# Patient Record
Sex: Male | Born: 1980 | Race: Asian | Hispanic: No | Marital: Married | Smoking: Never smoker
Health system: Southern US, Community
[De-identification: ages and names within clinical notes are randomized; demographics above are authoritative.]

## PROBLEM LIST (undated history)

## (undated) DIAGNOSIS — I213 ST elevation (STEMI) myocardial infarction of unspecified site: Secondary | ICD-10-CM

## (undated) DIAGNOSIS — I25110 Atherosclerotic heart disease of native coronary artery with unstable angina pectoris: Secondary | ICD-10-CM

## (undated) DIAGNOSIS — I2102 ST elevation (STEMI) myocardial infarction involving left anterior descending coronary artery: Secondary | ICD-10-CM

## (undated) DIAGNOSIS — Z9189 Other specified personal risk factors, not elsewhere classified: Secondary | ICD-10-CM

## (undated) DIAGNOSIS — Q245 Malformation of coronary vessels: Secondary | ICD-10-CM

## (undated) DIAGNOSIS — I5041 Acute combined systolic (congestive) and diastolic (congestive) heart failure: Secondary | ICD-10-CM

## (undated) DIAGNOSIS — I509 Heart failure, unspecified: Secondary | ICD-10-CM

## (undated) DIAGNOSIS — I255 Ischemic cardiomyopathy: Secondary | ICD-10-CM

## (undated) DIAGNOSIS — E785 Hyperlipidemia, unspecified: Secondary | ICD-10-CM

## (undated) DIAGNOSIS — Z955 Presence of coronary angioplasty implant and graft: Secondary | ICD-10-CM

## (undated) HISTORY — PX: CARDIAC CATHETERIZATION: SHX172

---
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
## 2018-09-21 DIAGNOSIS — M545 Low back pain: Secondary | ICD-10-CM | POA: Diagnosis not present

## 2018-09-21 DIAGNOSIS — R319 Hematuria, unspecified: Secondary | ICD-10-CM | POA: Diagnosis not present

## 2018-09-21 DIAGNOSIS — N23 Unspecified renal colic: Secondary | ICD-10-CM | POA: Diagnosis not present

## 2018-09-29 DIAGNOSIS — N209 Urinary calculus, unspecified: Secondary | ICD-10-CM | POA: Diagnosis not present

## 2018-10-12 ENCOUNTER — Encounter (HOSPITAL_COMMUNITY)
Admission: EM | Disposition: A | Discharge status: Home / Self Care | Payer: Self-pay | Source: Home / Self Care | Attending: Interventional Cardiology

## 2018-10-12 ENCOUNTER — Inpatient Hospital Stay (HOSPITAL_COMMUNITY)
Admission: EM | Admit: 2018-10-12 | Discharge: 2018-10-16 | DRG: 246 | Disposition: A | Discharge status: Home / Self Care | Payer: BC Managed Care – PPO | Attending: Interventional Cardiology | Admitting: Interventional Cardiology

## 2018-10-12 ENCOUNTER — Encounter (HOSPITAL_COMMUNITY): Payer: Self-pay | Admitting: Emergency Medicine

## 2018-10-12 ENCOUNTER — Emergency Department (HOSPITAL_COMMUNITY): Payer: BC Managed Care – PPO

## 2018-10-12 ENCOUNTER — Other Ambulatory Visit: Payer: Self-pay

## 2018-10-12 DIAGNOSIS — Q245 Malformation of coronary vessels: Secondary | ICD-10-CM | POA: Diagnosis not present

## 2018-10-12 DIAGNOSIS — Z79899 Other long term (current) drug therapy: Secondary | ICD-10-CM

## 2018-10-12 DIAGNOSIS — I772 Rupture of artery: Secondary | ICD-10-CM | POA: Diagnosis not present

## 2018-10-12 DIAGNOSIS — I2102 ST elevation (STEMI) myocardial infarction involving left anterior descending coronary artery: Secondary | ICD-10-CM | POA: Diagnosis not present

## 2018-10-12 DIAGNOSIS — Z20828 Contact with and (suspected) exposure to other viral communicable diseases: Secondary | ICD-10-CM | POA: Diagnosis present

## 2018-10-12 DIAGNOSIS — Z955 Presence of coronary angioplasty implant and graft: Secondary | ICD-10-CM

## 2018-10-12 DIAGNOSIS — Z9189 Other specified personal risk factors, not elsewhere classified: Secondary | ICD-10-CM

## 2018-10-12 DIAGNOSIS — I213 ST elevation (STEMI) myocardial infarction of unspecified site: Secondary | ICD-10-CM | POA: Diagnosis not present

## 2018-10-12 DIAGNOSIS — I255 Ischemic cardiomyopathy: Secondary | ICD-10-CM | POA: Diagnosis present

## 2018-10-12 DIAGNOSIS — I259 Chronic ischemic heart disease, unspecified: Secondary | ICD-10-CM | POA: Diagnosis not present

## 2018-10-12 DIAGNOSIS — I2101 ST elevation (STEMI) myocardial infarction involving left main coronary artery: Secondary | ICD-10-CM | POA: Diagnosis not present

## 2018-10-12 DIAGNOSIS — I2510 Atherosclerotic heart disease of native coronary artery without angina pectoris: Secondary | ICD-10-CM | POA: Diagnosis not present

## 2018-10-12 DIAGNOSIS — E785 Hyperlipidemia, unspecified: Secondary | ICD-10-CM | POA: Diagnosis not present

## 2018-10-12 DIAGNOSIS — R079 Chest pain, unspecified: Secondary | ICD-10-CM | POA: Diagnosis not present

## 2018-10-12 DIAGNOSIS — M5489 Other dorsalgia: Secondary | ICD-10-CM | POA: Diagnosis not present

## 2018-10-12 DIAGNOSIS — I959 Hypotension, unspecified: Secondary | ICD-10-CM | POA: Diagnosis not present

## 2018-10-12 DIAGNOSIS — D72829 Elevated white blood cell count, unspecified: Secondary | ICD-10-CM | POA: Diagnosis present

## 2018-10-12 DIAGNOSIS — I5041 Acute combined systolic (congestive) and diastolic (congestive) heart failure: Secondary | ICD-10-CM | POA: Diagnosis present

## 2018-10-12 DIAGNOSIS — I214 Non-ST elevation (NSTEMI) myocardial infarction: Secondary | ICD-10-CM | POA: Insufficient documentation

## 2018-10-12 DIAGNOSIS — R1084 Generalized abdominal pain: Secondary | ICD-10-CM | POA: Diagnosis not present

## 2018-10-12 DIAGNOSIS — N179 Acute kidney failure, unspecified: Secondary | ICD-10-CM | POA: Diagnosis not present

## 2018-10-12 DIAGNOSIS — I472 Ventricular tachycardia: Secondary | ICD-10-CM | POA: Diagnosis present

## 2018-10-12 DIAGNOSIS — I25110 Atherosclerotic heart disease of native coronary artery with unstable angina pectoris: Secondary | ICD-10-CM | POA: Diagnosis not present

## 2018-10-12 HISTORY — DX: Ischemic cardiomyopathy: I25.5

## 2018-10-12 HISTORY — DX: Hyperlipidemia, unspecified: E78.5

## 2018-10-12 HISTORY — PX: LEFT HEART CATH AND CORONARY ANGIOGRAPHY: CATH118249

## 2018-10-12 HISTORY — DX: Malformation of coronary vessels: Q24.5

## 2018-10-12 HISTORY — DX: ST elevation (STEMI) myocardial infarction involving left anterior descending coronary artery: I21.02

## 2018-10-12 HISTORY — PX: CORONARY/GRAFT ACUTE MI REVASCULARIZATION: CATH118305

## 2018-10-12 HISTORY — DX: ST elevation (STEMI) myocardial infarction of unspecified site: I21.3

## 2018-10-12 LAB — CBC
HCT: 41.3 % (ref 39.0–52.0)
Hemoglobin: 14.5 g/dL (ref 13.0–17.0)
MCH: 28.7 pg (ref 26.0–34.0)
MCHC: 35.1 g/dL (ref 30.0–36.0)
MCV: 81.6 fL (ref 80.0–100.0)
Platelets: 237 10*3/uL (ref 150–400)
RBC: 5.06 MIL/uL (ref 4.22–5.81)
RDW: 12.1 % (ref 11.5–15.5)
WBC: 17.3 10*3/uL — ABNORMAL HIGH (ref 4.0–10.5)
nRBC: 0 % (ref 0.0–0.2)

## 2018-10-12 LAB — CBC WITH DIFFERENTIAL/PLATELET
Abs Immature Granulocytes: 0.06 10*3/uL (ref 0.00–0.07)
Basophils Absolute: 0 10*3/uL (ref 0.0–0.1)
Basophils Relative: 0 %
Eosinophils Absolute: 0 10*3/uL (ref 0.0–0.5)
Eosinophils Relative: 0 %
HCT: 43.9 % (ref 39.0–52.0)
Hemoglobin: 15.1 g/dL (ref 13.0–17.0)
Immature Granulocytes: 0 %
Lymphocytes Relative: 4 %
Lymphs Abs: 0.7 10*3/uL (ref 0.7–4.0)
MCH: 28.7 pg (ref 26.0–34.0)
MCHC: 34.4 g/dL (ref 30.0–36.0)
MCV: 83.3 fL (ref 80.0–100.0)
Monocytes Absolute: 0.6 10*3/uL (ref 0.1–1.0)
Monocytes Relative: 4 %
Neutro Abs: 14.7 10*3/uL — ABNORMAL HIGH (ref 1.7–7.7)
Neutrophils Relative %: 92 %
Platelets: 257 10*3/uL (ref 150–400)
RBC: 5.27 MIL/uL (ref 4.22–5.81)
RDW: 12.1 % (ref 11.5–15.5)
WBC: 16.1 10*3/uL — ABNORMAL HIGH (ref 4.0–10.5)
nRBC: 0 % (ref 0.0–0.2)

## 2018-10-12 LAB — CBG MONITORING, ED: Glucose-Capillary: 117 mg/dL — ABNORMAL HIGH (ref 70–99)

## 2018-10-12 LAB — POCT ACTIVATED CLOTTING TIME
Activated Clotting Time: 191 seconds
Activated Clotting Time: 351 seconds
Activated Clotting Time: 654 seconds

## 2018-10-12 LAB — BASIC METABOLIC PANEL
Anion gap: 12 (ref 5–15)
BUN: 12 mg/dL (ref 6–20)
CO2: 23 mmol/L (ref 22–32)
Calcium: 9.7 mg/dL (ref 8.9–10.3)
Chloride: 103 mmol/L (ref 98–111)
Creatinine, Ser: 1.23 mg/dL (ref 0.61–1.24)
GFR calc Af Amer: >60 mL/min (ref >60)
GFR calc non Af Amer: >60 mL/min (ref >60)
Glucose, Bld: 133 mg/dL — ABNORMAL HIGH (ref 70–99)
Potassium: 4.3 mmol/L (ref 3.5–5.1)
Sodium: 138 mmol/L (ref 135–145)

## 2018-10-12 LAB — TROPONIN I (HIGH SENSITIVITY)
Troponin I (High Sensitivity): >27000 ng/L (ref <18)
Troponin I (High Sensitivity): >27000 ng/L (ref <18)

## 2018-10-12 LAB — SARS CORONAVIRUS 2 BY RT PCR (HOSPITAL ORDER, PERFORMED IN ~~LOC~~ HOSPITAL LAB): SARS Coronavirus 2: NEGATIVE

## 2018-10-12 LAB — CREATININE, SERUM
Creatinine, Ser: 1.09 mg/dL (ref 0.61–1.24)
GFR calc Af Amer: >60 mL/min (ref >60)
GFR calc non Af Amer: >60 mL/min (ref >60)

## 2018-10-12 LAB — MRSA PCR SCREENING: MRSA by PCR: NEGATIVE

## 2018-10-12 SURGERY — CORONARY/GRAFT ACUTE MI REVASCULARIZATION
Anesthesia: LOCAL

## 2018-10-12 MED ORDER — NITROGLYCERIN 1 MG/10 ML FOR IR/CATH LAB
INTRA_ARTERIAL | Status: AC
  Filled 2018-10-12: qty 10

## 2018-10-12 MED ORDER — SODIUM CHLORIDE 0.9% FLUSH: 3.0000 mL | INTRAVENOUS | Status: DC | PRN

## 2018-10-12 MED ORDER — NITROGLYCERIN 1 MG/10 ML FOR IR/CATH LAB
INTRA_ARTERIAL | Status: DC | PRN
  Administered 2018-10-12: 200 ug via INTRACORONARY

## 2018-10-12 MED ORDER — CARVEDILOL 3.125 MG PO TABS
3.1250 mg | ORAL_TABLET | Freq: Two times a day (BID) | ORAL | Status: DC
  Administered 2018-10-13: 3.125 mg via ORAL
  Filled 2018-10-12: qty 1

## 2018-10-12 MED ORDER — VERAPAMIL HCL 2.5 MG/ML IV SOLN
INTRAVENOUS | Status: AC
  Filled 2018-10-12: qty 2

## 2018-10-12 MED ORDER — HEPARIN (PORCINE) IN NACL 1000-0.9 UT/500ML-% IV SOLN
INTRAVENOUS | Status: DC | PRN
  Administered 2018-10-12 (×2): 500 mL

## 2018-10-12 MED ORDER — ATORVASTATIN CALCIUM 80 MG PO TABS
80.0000 mg | ORAL_TABLET | Freq: Every day | ORAL | Status: DC
  Administered 2018-10-12 – 2018-10-15 (×4): 80 mg via ORAL
  Filled 2018-10-12 (×4): qty 1

## 2018-10-12 MED ORDER — NITROGLYCERIN 0.4 MG SL SUBL
0.4000 mg | SUBLINGUAL_TABLET | SUBLINGUAL | Status: AC | PRN
  Administered 2018-10-12 (×4): 0.4 mg via SUBLINGUAL
  Filled 2018-10-12: qty 1

## 2018-10-12 MED ORDER — SODIUM CHLORIDE 0.9 % IV SOLN
INTRAVENOUS | Status: AC
  Administered 2018-10-12: 18:00:00 via INTRAVENOUS

## 2018-10-12 MED ORDER — SODIUM CHLORIDE 0.9 % IV SOLN: 250.0000 mL | INTRAVENOUS | Status: DC | PRN

## 2018-10-12 MED ORDER — SODIUM CHLORIDE 0.9 % IV SOLN
INTRAVENOUS | Status: AC | PRN
  Administered 2018-10-12: 20 mL/h via INTRAVENOUS

## 2018-10-12 MED ORDER — HEPARIN SODIUM (PORCINE) 5000 UNIT/ML IJ SOLN
5000.0000 [IU] | Freq: Three times a day (TID) | INTRAMUSCULAR | Status: AC
  Administered 2018-10-12 – 2018-10-13 (×2): 5000 [IU] via SUBCUTANEOUS
  Filled 2018-10-12 (×2): qty 1

## 2018-10-12 MED ORDER — ACETAMINOPHEN 325 MG PO TABS: 650.0000 mg | ORAL_TABLET | ORAL | Status: DC | PRN

## 2018-10-12 MED ORDER — FENTANYL CITRATE (PF) 100 MCG/2ML IJ SOLN
INTRAMUSCULAR | Status: AC
  Filled 2018-10-12: qty 2

## 2018-10-12 MED ORDER — OXYCODONE HCL 5 MG PO TABS: 5.0000 mg | ORAL_TABLET | ORAL | Status: DC | PRN

## 2018-10-12 MED ORDER — LIDOCAINE HCL (PF) 1 % IJ SOLN
INTRAMUSCULAR | Status: DC | PRN
  Administered 2018-10-12: 2 mL

## 2018-10-12 MED ORDER — NITROGLYCERIN 0.4 MG SL SUBL: 0.4000 mg | SUBLINGUAL_TABLET | SUBLINGUAL | Status: DC | PRN

## 2018-10-12 MED ORDER — HEPARIN SODIUM (PORCINE) 1000 UNIT/ML IJ SOLN
INTRAMUSCULAR | Status: DC | PRN
  Administered 2018-10-12: 3500 [IU] via INTRAVENOUS
  Administered 2018-10-12: 8000 [IU] via INTRAVENOUS

## 2018-10-12 MED ORDER — SODIUM CHLORIDE 0.9% FLUSH
3.0000 mL | Freq: Two times a day (BID) | INTRAVENOUS | Status: DC
  Administered 2018-10-12 – 2018-10-16 (×7): 3 mL via INTRAVENOUS

## 2018-10-12 MED ORDER — VERAPAMIL HCL 2.5 MG/ML IV SOLN
INTRAVENOUS | Status: DC | PRN
  Administered 2018-10-12: 16:00:00 10 mL via INTRA_ARTERIAL

## 2018-10-12 MED ORDER — MIDAZOLAM HCL 2 MG/2ML IJ SOLN
INTRAMUSCULAR | Status: AC
  Filled 2018-10-12: qty 2

## 2018-10-12 MED ORDER — HEPARIN BOLUS VIA INFUSION
4000.0000 [IU] | Freq: Once | INTRAVENOUS | Status: DC
  Filled 2018-10-12: qty 4000

## 2018-10-12 MED ORDER — HYDRALAZINE HCL 20 MG/ML IJ SOLN: 10.0000 mg | INTRAMUSCULAR | Status: AC | PRN

## 2018-10-12 MED ORDER — FENTANYL CITRATE (PF) 100 MCG/2ML IJ SOLN
INTRAMUSCULAR | Status: DC | PRN
  Administered 2018-10-12: 25 ug via INTRAVENOUS

## 2018-10-12 MED ORDER — HEPARIN (PORCINE) IN NACL 1000-0.9 UT/500ML-% IV SOLN
INTRAVENOUS | Status: AC
  Filled 2018-10-12: qty 1000

## 2018-10-12 MED ORDER — TICAGRELOR 90 MG PO TABS
ORAL_TABLET | ORAL | Status: DC | PRN
  Administered 2018-10-12: 180 mg via ORAL

## 2018-10-12 MED ORDER — LIDOCAINE HCL (PF) 1 % IJ SOLN
INTRAMUSCULAR | Status: AC
  Filled 2018-10-12: qty 30

## 2018-10-12 MED ORDER — ASPIRIN 81 MG PO CHEW
81.0000 mg | CHEWABLE_TABLET | Freq: Every day | ORAL | Status: DC
  Administered 2018-10-12 – 2018-10-16 (×5): 81 mg via ORAL
  Filled 2018-10-12 (×4): qty 1

## 2018-10-12 MED ORDER — ONDANSETRON HCL 4 MG/2ML IJ SOLN: 4.0000 mg | Freq: Four times a day (QID) | INTRAMUSCULAR | Status: DC | PRN

## 2018-10-12 MED ORDER — ATORVASTATIN CALCIUM 80 MG PO TABS: 80.0000 mg | ORAL_TABLET | Freq: Every day | ORAL | Status: DC

## 2018-10-12 MED ORDER — SODIUM CHLORIDE 0.9 % IV SOLN: INTRAVENOUS | Status: DC

## 2018-10-12 MED ORDER — ZOLPIDEM TARTRATE 5 MG PO TABS: 5.0000 mg | ORAL_TABLET | Freq: Every evening | ORAL | Status: DC | PRN

## 2018-10-12 MED ORDER — HEPARIN SODIUM (PORCINE) 1000 UNIT/ML IJ SOLN
INTRAMUSCULAR | Status: AC
  Filled 2018-10-12: qty 1

## 2018-10-12 MED ORDER — LABETALOL HCL 5 MG/ML IV SOLN: 10.0000 mg | INTRAVENOUS | Status: AC | PRN

## 2018-10-12 MED ORDER — ONDANSETRON HCL 4 MG/2ML IJ SOLN
4.0000 mg | Freq: Four times a day (QID) | INTRAMUSCULAR | Status: DC | PRN
  Administered 2018-10-13: 4 mg via INTRAVENOUS
  Filled 2018-10-12: qty 2

## 2018-10-12 MED ORDER — SODIUM CHLORIDE 0.9% FLUSH: 3.0000 mL | Freq: Two times a day (BID) | INTRAVENOUS | Status: DC

## 2018-10-12 MED ORDER — IOHEXOL 350 MG/ML SOLN
INTRAVENOUS | Status: DC | PRN
  Administered 2018-10-12: 205 mL via INTRA_ARTERIAL

## 2018-10-12 MED ORDER — LOSARTAN POTASSIUM 50 MG PO TABS
25.0000 mg | ORAL_TABLET | Freq: Every day | ORAL | Status: DC
  Administered 2018-10-13: 25 mg via ORAL
  Filled 2018-10-12: qty 1

## 2018-10-12 MED ORDER — ALPRAZOLAM 0.25 MG PO TABS: 0.2500 mg | ORAL_TABLET | Freq: Two times a day (BID) | ORAL | Status: DC | PRN

## 2018-10-12 MED ORDER — TICAGRELOR 90 MG PO TABS
ORAL_TABLET | ORAL | Status: AC
  Filled 2018-10-12: qty 2

## 2018-10-12 MED ORDER — ASPIRIN 81 MG PO CHEW
324.0000 mg | CHEWABLE_TABLET | Freq: Once | ORAL | Status: AC
  Administered 2018-10-12: 324 mg via ORAL
  Filled 2018-10-12: qty 4

## 2018-10-12 MED ORDER — TICAGRELOR 90 MG PO TABS
90.0000 mg | ORAL_TABLET | Freq: Two times a day (BID) | ORAL | Status: DC
  Administered 2018-10-12 – 2018-10-16 (×8): 90 mg via ORAL
  Filled 2018-10-12 (×8): qty 1

## 2018-10-12 MED ORDER — MIDAZOLAM HCL 2 MG/2ML IJ SOLN
INTRAMUSCULAR | Status: DC | PRN
  Administered 2018-10-12: 0.5 mg via INTRAVENOUS

## 2018-10-12 MED ORDER — NITROGLYCERIN IN D5W 200-5 MCG/ML-% IV SOLN
INTRAVENOUS | Status: AC
  Filled 2018-10-12: qty 250

## 2018-10-12 MED ORDER — ENOXAPARIN SODIUM 40 MG/0.4ML ~~LOC~~ SOLN: 40.0000 mg | SUBCUTANEOUS | Status: DC

## 2018-10-12 MED ORDER — METOPROLOL TARTRATE 5 MG/5ML IV SOLN
INTRAVENOUS | Status: AC
  Filled 2018-10-12: qty 5

## 2018-10-12 SURGICAL SUPPLY — 19 items
BALLN SAPPHIRE 2.5X12 (BALLOONS) ×2
BALLOON SAPPHIRE 2.5X12 (BALLOONS) ×1 IMPLANT
CATH 5FR JL3.5 JR4 ANG PIG MP (CATHETERS) ×2 IMPLANT
CATH LAUNCHER 6FR JL4 (CATHETERS) ×2 IMPLANT
CATH VISTA GUIDE 6FR XBLAD3.0 (CATHETERS) ×2 IMPLANT
CATH VISTA GUIDE 6FR XBLAD3.5 (CATHETERS) ×2 IMPLANT
COVER DOME SNAP 22 D (MISCELLANEOUS) ×2 IMPLANT
DEVICE RAD COMP TR BAND LRG (VASCULAR PRODUCTS) ×2 IMPLANT
GLIDESHEATH SLEND A-KIT 6F 22G (SHEATH) ×2 IMPLANT
GUIDEWIRE INQWIRE 1.5J.035X260 (WIRE) ×1 IMPLANT
INQWIRE 1.5J .035X260CM (WIRE) ×2
KIT ENCORE 26 ADVANTAGE (KITS) ×2 IMPLANT
KIT HEART LEFT (KITS) ×2 IMPLANT
PACK CARDIAC CATHETERIZATION (CUSTOM PROCEDURE TRAY) ×2 IMPLANT
SHEATH PROBE COVER 6X72 (BAG) ×2 IMPLANT
STENT RESOLUTE ONYX 3.5X18 (Permanent Stent) ×2 IMPLANT
TRANSDUCER W/STOPCOCK (MISCELLANEOUS) ×2 IMPLANT
TUBING CIL FLEX 10 FLL-RA (TUBING) ×2 IMPLANT
WIRE ASAHI PROWATER 180CM (WIRE) ×2 IMPLANT

## 2018-10-12 NOTE — ED Provider Notes (Signed)
Patient presents as a transfer from [facility] hospital.  Briefly, patient presented for evaluation of left-sided chest pain and diaphoresis starting at 6:30 AM this morning.  Was found to have EKG changes and a troponin of greater than 27,000.  Consulted with cardiology who recommended transfer to [facility] for cath.  3:50 PM evaluated pt at bedside. Rosaria Ferries, [facility] with cardiology at bedside also evaluating the patient. Pt with mild tachycardic, but otherwise stable at this time. Cath lab is ready, pt will be transferred.  4:02 PM received call from Dr. Tamala Julian with cardiology who states that the Cath Lab is ready and to send the patient up.  Attempted to reassess patient to inform cardiology PA, however the patient already left the room to go to cath.     Rodney Booze, PA-C 10/12/18 1643    Charlesetta Shanks, MD 10/12/18 1655

## 2018-10-12 NOTE — H&P (Addendum)
**Note James-Identified via Obfuscation** The patient has been seen in conjunction with James [name], PAC. All aspects of care have been considered and discussed. The patient has been personally interviewed, examined, and all clinical data has been reviewed.   38 year old gentleman previously healthy without exertional limitations who developed substernal chest discomfort at 6:30 AM.  Arrived in emergency room at around 2 PM.  1-1/2 to 2 hours later hs-troponin high came back greater than 27,000.  The patient was sent directly [facility] room to [facility] where he was examined, screened for COVID 19, and brought him straight to the cardiac catheterization laboratory.  Ongoing chest discomfort that is graded at 5/10.  S4 gallop on exam.  EKG reveals Q waves V1 through V3 with resolution of prior ST elevation present on initial EKG noted 2 hours earlier.  Pulses were 2+ and symmetric in the radial and posterior tibial bilateral.  Lungs are clear.  Late presenting anterior ST elevation myocardial infarction with ongoing pain.  Emergency coronary angiography and mechanical work reperfusion if possible.  Critical Care Time: 35 min    Cardiology Admission History and Physical:   Patient ID: James [name]; MRN: [id number]; DOB: 05/11/1980   Admission date: 10/12/2018  Primary Care Provider: Patient, No Pcp Per Primary Cardiologist: No primary care provider on file. New Primary Electrophysiologist:  None  Chief Complaint:  STEMI  Patient Profile:   James BurrsKumar Nipp is a 38 y.o. male with a history of possible hyperlipidemia.  He has no history of hypertension, diabetes, family history of premature coronary artery disease, tobacco use, alcohol or drug abuse.  History of Present Illness:   [name] was in his usual state of health until this morning.  He woke up about 5 AM with severe chest pain, 8/10.  He also had back pain at the same time.  He did not feel that the chest pain went through to  his back.  He had never had this pain before.  It was associated with diaphoresis, but no nausea, vomiting, or shortness of breath.  When his symptoms did not resolve, he woke his wife.  He tried changing positions and doing other things to get rid of the pain, but did not take anything except for garlic.  When his symptoms did not resolve, he finally came to the [facility] emergency room.  In the emergency room, he was diagnosed as a STEMI.  He was given aspirin 324 mg, 4000 unit bolus of heparin and sublingual nitroglycerin.  The nitroglycerin helped his chest pain.  He was transported emergently to [facility] and taken to the Cath Lab.  Upon arrival to [facility], his chest pain was a 5/10.   Past Medical History:  Diagnosis Date  . Hyperlipidemia    possible elevated triglycerides    History reviewed. No pertinent surgical history.   Medications Prior to Admission: Prior to Admission medications   Not on File     Allergies:   No Known Allergies  Social History:   Social History   Socioeconomic History  . Marital status: Married    Spouse name: Not on file  . Number of children: Not on file  . Years of education: Not on file  . Highest education level: Not on file  Occupational History  . Occupation: Primary school teacherT    Employer: [employer]   Social Needs  . Financial resource strain: Not on file  . Food insecurity    Worry: Not on file    Inability: Not  on file  . Transportation needs    Medical: Not on file    Non-medical: Not on file  Tobacco Use  . Smoking status: Never Smoker  . Smokeless tobacco: Never Used  Substance and Sexual Activity  . Alcohol use: Yes    Alcohol/week: 2.0 standard drinks    Types: 2 Cans of beer per week  . Drug use: Never  . Sexual activity: Not on file  Lifestyle  . Physical activity    Days per week: Not on file    Minutes per session: Not on file  . Stress: Not on file  Relationships  . Social Herbalist on phone: Not on file    Gets  together: Not on file    Attends religious service: Not on file    Active member of club or organization: Not on file    Attends meetings of clubs or organizations: Not on file    Relationship status: Not on file  . Intimate partner violence    Fear of current or ex partner: Not on file    Emotionally abused: Not on file    Physically abused: Not on file    Forced sexual activity: Not on file  Other Topics Concern  . Not on file  Social History Narrative   Patient lives with wife and small child    Family History:   The patient's family history is negative for Heart disease.   The patient He indicated that his mother is alive. He indicated that his father is alive. He indicated that the status of his neg hx is unknown.   ROS:  Please see the history of present illness.  All other ROS reviewed and negative.     Physical Exam/Data:   Vitals:   10/12/18 1500 10/12/18 1550 10/12/18 1614 10/12/18 1617  BP: 115/88 132/88    Pulse:  96    Resp: (!) 23 20    Temp:      TempSrc:      SpO2: 100% 99% 100%   Weight:    64 kg  Height:    5\' 5"  (1.651 m)   No intake or output data in the 24 hours ending 10/12/18 1629 Filed Weights   10/12/18 1617  Weight: 64 kg   Body mass index is 23.48 kg/m.  General:  Well nourished, well developed, in no distress HEENT: normal Lymph: no adenopathy Neck:  JVD not elevated Endocrine:  No thryomegaly Vascular: No carotid bruits; FA pulses 2+ bilaterally without bruits  Cardiac:  normal S1, S2; RRR; no murmur, no rub or gallop  Lungs:  clear to auscultation bilaterally, no wheezing, rhonchi or rales  Abd: soft, nontender, no hepatomegaly  Ext: no edema Musculoskeletal:  No deformities, BUE and BLE strength normal and equal Skin: warm and dry  Neuro:  CNs 2-12 intact, no focal abnormalities noted Psych:  Normal affect    EKG:  The ECG that was done sinus tachycardia, heart rate 103, ST elevation in leads I, aVL and V2 reciprocal changes  in leads II, III and aVF.  Relevant CV Studies:  None previously  Laboratory Data:  Chemistry Recent Labs  Lab 10/12/18 1411  NA 138  K 4.3  CL 103  CO2 23  GLUCOSE 133*  BUN 12  CREATININE 1.23  CALCIUM 9.7  GFRNONAA >60  GFRAA >60  ANIONGAP 12    No results for input(s): PROT, ALBUMIN, AST, ALT, ALKPHOS, BILITOT in the last 168 hours. Hematology  Recent Labs  Lab 10/12/18 1411  WBC 16.1*  RBC 5.27  HGB 15.1  HCT 43.9  MCV 83.3  MCH 28.7  MCHC 34.4  RDW 12.1  PLT 257   Cardiac EnzymesNo results for input(s): TROPONINI in the last 168 hours. No results for input(s): TROPIPOC in the last 168 hours.  BNPNo results for input(s): BNP, PROBNP in the last 168 hours.  DDimer No results for input(s): DDIMER in the last 168 hours.  Radiology/Studies:  Dg Chest Port 1 View  Result Date: 10/12/2018 CLINICAL DATA:  Chest pain and upper back pain. EXAM: PORTABLE CHEST 1 VIEW COMPARISON:  None. FINDINGS: The heart size and mediastinal contours are within normal limits. Both lungs are clear. The visualized skeletal structures are unremarkable. IMPRESSION: Normal exam. Electronically Signed   By: Francene BoyersJames  Maxwell M.D.   On: 10/12/2018 14:38    Assessment and Plan:   1.  STEMI: - He is being taken emergently to the Cath Lab with further evaluation and treatment depending on the results. -We will screen for hyperlipidemia and diabetes and check a TSH plus liver functions as well. -Start high-dose statin - We will leave beta-blocker and other medications to Dr. Katrinka BlazingSmith -I have updated his wife by phone, I also spoke to his brother and sister-in-law who are currently in [place].  Active Problems:   Acute ST elevation myocardial infarction (STEMI) involving left anterior descending (LAD) coronary artery (HCC)  James Demarkhonda Barrett, PA-C 10/12/2018 4:31 PM Beeper [phone]  For questions or updates, please contact [facility] Please consult [email or web address] for contact info under  Cardiology/STEMI.    Melida QuitterSigned, Rhonda Barrett, PA-C  10/12/2018 4:29 PM

## 2018-10-12 NOTE — ED Provider Notes (Signed)
Care assumed from Dr. Eulis Foster at shift change.  Patient presenting here with chest pain since approximately 6:30 AM.  Awaiting results of troponin.    Troponin has returned at greater than 27,000.  Code STEMI initiated and patient immediately reevaluated.  He is having ongoing pain despite nitroglycerin.  He has received aspirin and was given a 4000 unit bolus of heparin.  Care discussed with the cardiology master.  Patient will be sent to [facility] by [facility] for likely emergent catheterization.   Veryl Speak, MD 10/12/18 1531

## 2018-10-12 NOTE — ED Notes (Signed)
X-ray at bedside

## 2018-10-12 NOTE — ED Notes (Signed)
Date and time results received: 10/12/18 3:09 PM (use smartphrase ".now" to insert current time)  Test: Trop Critical Value: >27000  Name of Provider Notified: Eulis Foster  Orders Received? Or Actions Taken?: Orders Received - See Orders for details

## 2018-10-12 NOTE — ED Notes (Addendum)
[facility] here to transfer pt to [facility]. Pt off unit via stretcher. This nurse unable to administer ordered Heparin d/t pharmacy has not sent this up to unit at time of transfer. [facility] reports can start Heparin 4000 Unit bolous in truck.

## 2018-10-12 NOTE — ED Notes (Signed)
Pt arrived via EMS from [place] at

## 2018-10-12 NOTE — ED Notes (Signed)
This nurse called pharmacy requesting ordered Heparin Bolus to be verified STAT to administer per MD order.

## 2018-10-12 NOTE — ED Notes (Signed)
Pt arrived via EMS from [facility]. Got report from EMS (EMS gave four Nitroglycerin, 4000 units of Heparin, 324 of Aspirin). [facility] 12 Lead displayed ST Elevation but EMS reported that EKG did not display elevation on arrival. Troponin levels were elevated and patient ingested "garlic and honey" as a supplement prior to coming to ED.  Pt has a 20G in the R AC. Cardiologist arrived at [phone] and went over catheterization procedure. Pt was taken to cath lab at 1600.

## 2018-10-12 NOTE — ED Triage Notes (Signed)
Patient c/o constant generalized chest pain x1 day. States recently started exercising. Reports pain worsens with movement. Denies fever, cough, SOB.

## 2018-10-12 NOTE — ED Provider Notes (Signed)
[facility]-EMERGENCY DEPT Provider Note   CSN: [id number] Arrival date & time: 10/12/18  1316    History   Chief Complaint Chief Complaint  Patient presents with  . Chest Pain    HPI De James [name] is a 38 y.o. male.     HPI He is here for evaluation of chest pain.  The pain started at 10 AM this morning without provocation.  Initially it was severe, 10/10, now it is 8/10 without particular treatment.  Pain radiates to his upper back, bilaterally.  He has never had pain like this before.  He had a muscle ache yesterday in his anterior bilateral chest, which he feels is from lifting weights, several days ago.  At that time he had just started working out.  He states he has a history of high cholesterol, but improved after treatment and he is not currently on medications.  He denies associated shortness of breath, diaphoresis, vomiting.  He did have some brief nausea earlier today when he was brushing his teeth but stopped brushing and the nausea went away.  There are no other known modifying factors.    History reviewed. No pertinent past medical history.  There are no active problems to display for this patient.   History reviewed. No pertinent surgical history.      Home Medications    Prior to Admission medications   Not on File    Family History No family history on file.  Social History Social History   Tobacco Use  . Smoking status: Not on file  Substance Use Topics  . Alcohol use: Not on file  . Drug use: Not on file     Allergies   Patient has no known allergies.   Review of Systems Review of Systems  All other systems reviewed and are negative.    Physical Exam Updated Vital Signs BP (!) 140/104   Pulse 100   Temp 98.3 F (36.8 C) (Oral)   Resp (!) 25   SpO2 100%   Physical Exam Vitals signs and nursing note reviewed.  Constitutional:      Appearance: He is well-developed.  HENT:     Head: Normocephalic and  atraumatic.     Right Ear: External ear normal.     Left Ear: External ear normal.  Eyes:     Conjunctiva/sclera: Conjunctivae normal.     Pupils: Pupils are equal, round, and reactive to light.  Neck:     Musculoskeletal: Normal range of motion and neck supple.     Trachea: Phonation normal.  Cardiovascular:     Rate and Rhythm: Normal rate and regular rhythm.     Heart sounds: Normal heart sounds.  Pulmonary:     Effort: Pulmonary effort is normal. No respiratory distress.     Breath sounds: Normal breath sounds. No stridor.  Chest:     Chest wall: No tenderness.  Abdominal:     Palpations: Abdomen is soft.     Tenderness: There is no abdominal tenderness.  Musculoskeletal: Normal range of motion.     Right lower leg: No edema.     Left lower leg: No edema.  Skin:    General: Skin is warm and dry.  Neurological:     Mental Status: He is alert and oriented to person, place, and time.     Cranial Nerves: No cranial nerve deficit.     Sensory: No sensory deficit.     Motor: No abnormal muscle tone.  Coordination: Coordination normal.  Psychiatric:        Mood and Affect: Mood normal.        Behavior: Behavior normal.        Thought Content: Thought content normal.        Judgment: Judgment normal.      ED Treatments / Results  Labs (all labs ordered are listed, but only abnormal results are displayed) Labs Reviewed  CBC WITH DIFFERENTIAL/PLATELET - Abnormal; Notable for the following components:      Result Value   WBC 16.1 (*)    Neutro Abs 14.7 (*)    All other components within normal limits  SARS CORONAVIRUS 2 (HOSPITAL ORDER, [place] [facility])  BASIC METABOLIC PANEL  TROPONIN I (HIGH SENSITIVITY)  TROPONIN I (HIGH SENSITIVITY)    EKG EKG Interpretation  Date/Time:  Thursday October 12 2018 13:27:44 EDT Ventricular Rate:  103 PR Interval:    QRS Duration: 92 QT Interval:  322 QTC Calculation: 422 R Axis:   -85 Text  Interpretation:  Sinus tachycardia Left anterior fascicular block Probable anterolateral infarct, recent Baseline wander in lead(s) V6 No old tracing to compare Confirmed by Daleen Bo [phone]) on 10/12/2018 1:31:01 PM   Radiology Dg Chest Port 1 View  Result Date: 10/12/2018 CLINICAL DATA:  Chest pain and upper back pain. EXAM: PORTABLE CHEST 1 VIEW COMPARISON:  None. FINDINGS: The heart size and mediastinal contours are within normal limits. Both lungs are clear. The visualized skeletal structures are unremarkable. IMPRESSION: Normal exam. Electronically Signed   By: Lorriane Shire M.D.   On: 10/12/2018 14:38    Procedures .Critical Care Performed by: Daleen Bo, MD Authorized by: Daleen Bo, MD   Critical care provider statement:    Critical care time (minutes):  35   Critical care start time:  10/12/2018 1:25 PM   Critical care end time:  10/12/2018 3:09 PM   Critical care time was exclusive of:  Separately billable procedures and treating other patients   Critical care was necessary to treat or prevent imminent or life-threatening deterioration of the following conditions:  Circulatory failure   Critical care was time spent personally by me on the following activities:  Blood draw for specimens, development of treatment plan with patient or surrogate, discussions with consultants, evaluation of patient's response to treatment, examination of patient, obtaining history from patient or surrogate, ordering and performing treatments and interventions, ordering and review of laboratory studies, pulse oximetry, re-evaluation of patient's condition, review of old charts and ordering and review of radiographic studies   (including critical care time)  Medications Ordered in ED Medications  0.9 %  sodium chloride infusion (has no administration in time range)  aspirin chewable tablet 324 mg (324 mg Oral Given 10/12/18 1402)  nitroGLYCERIN (NITROSTAT) SL tablet 0.4 mg (0.4 mg Sublingual  Given 10/12/18 1426)     Initial Impression / Assessment and Plan / ED Course  I have reviewed the triage vital signs and the nursing notes.  Pertinent labs & imaging results that were available during my care of the patient were reviewed by me and considered in my medical decision making (see chart for details).  Clinical Course as of Oct 12 1507  Thu Oct 12, 2018  1331 EKG does not indicate STEMI.  There is nonspecific inferior ST depression.   [EW]  1507 Abnormal, white count high  CBC with Differential(!) [EW]    Clinical Course User Index [EW] Daleen Bo, MD  Patient Vitals for the past 24 hrs:  BP Temp Temp src Pulse Resp SpO2  10/12/18 1400 (!) 140/104 - - - (!) 25 -  10/12/18 1328 (!) 142/108 98.3 F (36.8 C) Oral 100 19 100 %      Medical Decision Making: Evaluation concerning for unstable angina.  EKG is not diagnostic of STEMI.  Treated with aspirin nitro, and labs ordered.  CRITICAL CARE-yes Performed by: Mancel BaleElliott Preslyn Warr   Final Clinical Impressions(s) / ED Diagnoses   Final diagnoses:  Nonspecific chest pain    ED Discharge Orders    None       Mancel BaleWentz, Fallen Crisostomo, MD 10/12/18 [phone]

## 2018-10-12 NOTE — CV Procedure (Addendum)
   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  Proximal near ostial LAD occlusion treated with a 3.5 x 18 Onyx---> 0% with TIMI grade III flow.  As the stent was being deployed before the balloon was completely inflated the patient took a deep breath which pull the stent back into the left main.  Circumflex widely patent.  Nondominant RCA with fistula collaterals to LAD.  Anteroapical akinesis with EF 30 to 35%.  Elevated LVEDP 29 millimeters mercury  Prolonged aspirin and ticagrelor.  May need eventual CT scan to determine the connection of the AV fistula which I believe is right coronary--> pulmonary artery and also left main--> pulmonary artery.

## 2018-10-12 NOTE — Progress Notes (Signed)
Arrived to [facility] @ 1730. Neuro intact, denies pain. VSS. +CSM to right radial TR site-no oozing noted. Per cath lab RN 14 cc air in @ 1710-due to initiate deflation after 1910. NSR no ectopy. Wife updated.

## 2018-10-13 ENCOUNTER — Encounter (HOSPITAL_COMMUNITY): Payer: Self-pay | Admitting: Interventional Cardiology

## 2018-10-13 ENCOUNTER — Inpatient Hospital Stay (HOSPITAL_COMMUNITY): Payer: BC Managed Care – PPO

## 2018-10-13 DIAGNOSIS — I25110 Atherosclerotic heart disease of native coronary artery with unstable angina pectoris: Secondary | ICD-10-CM

## 2018-10-13 DIAGNOSIS — E785 Hyperlipidemia, unspecified: Secondary | ICD-10-CM | POA: Clinically undetermined

## 2018-10-13 DIAGNOSIS — I5041 Acute combined systolic (congestive) and diastolic (congestive) heart failure: Secondary | ICD-10-CM

## 2018-10-13 DIAGNOSIS — Z955 Presence of coronary angioplasty implant and graft: Secondary | ICD-10-CM

## 2018-10-13 DIAGNOSIS — I213 ST elevation (STEMI) myocardial infarction of unspecified site: Secondary | ICD-10-CM

## 2018-10-13 DIAGNOSIS — Z9189 Other specified personal risk factors, not elsewhere classified: Secondary | ICD-10-CM

## 2018-10-13 DIAGNOSIS — I259 Chronic ischemic heart disease, unspecified: Secondary | ICD-10-CM

## 2018-10-13 HISTORY — DX: Acute combined systolic (congestive) and diastolic (congestive) heart failure: I50.41

## 2018-10-13 HISTORY — DX: Other specified personal risk factors, not elsewhere classified: Z91.89

## 2018-10-13 HISTORY — DX: Presence of coronary angioplasty implant and graft: Z95.5

## 2018-10-13 HISTORY — DX: Atherosclerotic heart disease of native coronary artery with unstable angina pectoris: I25.110

## 2018-10-13 LAB — COMPREHENSIVE METABOLIC PANEL
ALT: 102 U/L — ABNORMAL HIGH (ref 0–44)
AST: 517 U/L — ABNORMAL HIGH (ref 15–41)
Albumin: 3.9 g/dL (ref 3.5–5.0)
Alkaline Phosphatase: 60 U/L (ref 38–126)
Anion gap: 11 (ref 5–15)
BUN: 8 mg/dL (ref 6–20)
CO2: 23 mmol/L (ref 22–32)
Calcium: 9.2 mg/dL (ref 8.9–10.3)
Chloride: 102 mmol/L (ref 98–111)
Creatinine, Ser: 1.08 mg/dL (ref 0.61–1.24)
GFR calc Af Amer: >60 mL/min (ref >60)
GFR calc non Af Amer: >60 mL/min (ref >60)
Glucose, Bld: 114 mg/dL — ABNORMAL HIGH (ref 70–99)
Potassium: 4 mmol/L (ref 3.5–5.1)
Sodium: 136 mmol/L (ref 135–145)
Total Bilirubin: 1.3 mg/dL — ABNORMAL HIGH (ref 0.3–1.2)
Total Protein: 6.9 g/dL (ref 6.5–8.1)

## 2018-10-13 LAB — LIPID PANEL
Cholesterol: 196 mg/dL (ref 0–200)
HDL: 43 mg/dL (ref >40)
LDL Cholesterol: 118 mg/dL — ABNORMAL HIGH (ref 0–99)
Total CHOL/HDL Ratio: 4.6 RATIO
Triglycerides: 174 mg/dL — ABNORMAL HIGH (ref <150)
VLDL: 35 mg/dL (ref 0–40)

## 2018-10-13 LAB — ECHOCARDIOGRAM COMPLETE
Height: 65 in
Weight: 2271.62 oz

## 2018-10-13 LAB — CBC
HCT: 39.6 % (ref 39.0–52.0)
Hemoglobin: 14.1 g/dL (ref 13.0–17.0)
MCH: 29 pg (ref 26.0–34.0)
MCHC: 35.6 g/dL (ref 30.0–36.0)
MCV: 81.5 fL (ref 80.0–100.0)
Platelets: 223 10*3/uL (ref 150–400)
RBC: 4.86 MIL/uL (ref 4.22–5.81)
RDW: 12.3 % (ref 11.5–15.5)
WBC: 16.3 10*3/uL — ABNORMAL HIGH (ref 4.0–10.5)
nRBC: 0 % (ref 0.0–0.2)

## 2018-10-13 LAB — BRAIN NATRIURETIC PEPTIDE: B Natriuretic Peptide: 325.1 pg/mL — ABNORMAL HIGH (ref 0.0–100.0)

## 2018-10-13 LAB — HEMOGLOBIN A1C
Hgb A1c MFr Bld: 5.2 % (ref 4.8–5.6)
Mean Plasma Glucose: 102.54 mg/dL

## 2018-10-13 LAB — TSH: TSH: 3.547 u[IU]/mL (ref 0.350–4.500)

## 2018-10-13 LAB — HIV ANTIBODY (ROUTINE TESTING W REFLEX): HIV Screen 4th Generation wRfx: NONREACTIVE

## 2018-10-13 MED ORDER — SPIRONOLACTONE 12.5 MG HALF TABLET
12.5000 mg | ORAL_TABLET | Freq: Every day | ORAL | Status: DC
  Administered 2018-10-14 – 2018-10-16 (×3): 12.5 mg via ORAL
  Filled 2018-10-13 (×3): qty 1

## 2018-10-13 MED ORDER — PERFLUTREN LIPID MICROSPHERE
1.0000 mL | INTRAVENOUS | Status: AC | PRN
  Administered 2018-10-13: 2 mL via INTRAVENOUS
  Filled 2018-10-13: qty 10

## 2018-10-13 MED ORDER — CARVEDILOL 3.125 MG PO TABS
3.1250 mg | ORAL_TABLET | Freq: Once | ORAL | Status: AC
  Administered 2018-10-13: 3.125 mg via ORAL
  Filled 2018-10-13: qty 1

## 2018-10-13 MED ORDER — CARVEDILOL 6.25 MG PO TABS
6.2500 mg | ORAL_TABLET | Freq: Two times a day (BID) | ORAL | Status: DC
  Administered 2018-10-13: 6.25 mg via ORAL
  Filled 2018-10-13: qty 1

## 2018-10-13 MED ORDER — CHLORHEXIDINE GLUCONATE CLOTH 2 % EX PADS
6.0000 | MEDICATED_PAD | Freq: Every day | CUTANEOUS | Status: DC
  Administered 2018-10-13 – 2018-10-14 (×2): 6 via TOPICAL

## 2018-10-13 NOTE — Progress Notes (Signed)
     Pt presented with acute anterior MI. Echo today with EF noted at 30-35%, and meets criteria for Lifevest placement. Will fax order for vest and notify rep. Planned for possible DC over the weekend.   TTE: 10/13/18  IMPRESSIONS    1. The left ventricle has moderate-severely reduced systolic function, with an ejection fraction of 30-35%. The cavity size was normal. Left ventricular diastolic function could not be evaluated due to nondiagnostic images.  2. There is akinesis of the apical septal, mid and apical anteroseptal, mid and apical lateral, entire anterior and apical walls consistent with infarct. Definity contrast suggestive of early mural thrombus formation in the apex in some views.  3. The right ventricle has normal systolic function. The cavity was normal. There is no increase in right ventricular wall thickness.  4. There is mild mitral annular calcification present.  5. The aorta is normal in size and structure.  SignedReino Bellis, NP-C 10/13/2018, 4:19 PM Pager: [phone]

## 2018-10-13 NOTE — Progress Notes (Signed)
  Echocardiogram 2D Echocardiogram has been performed.  James [name] L Androw 10/13/2018, 10:04 AM

## 2018-10-13 NOTE — Plan of Care (Signed)
  Problem: Clinical Measurements: Goal: Diagnostic test results will improve Outcome: Progressing   Problem: Clinical Measurements: Goal: Cardiovascular complication will be avoided Outcome: Progressing   Problem: Nutrition: Goal: Adequate nutrition will be maintained Outcome: Progressing   

## 2018-10-13 NOTE — Care Management (Signed)
Brilinta benefits check sent and pending.  Elishah Ashmore RN, BSN, NCM-BC, ACM-RN [phone] 

## 2018-10-13 NOTE — Progress Notes (Signed)
CARDIAC REHAB PHASE I   PRE:  Rate/Rhythm: 103 ST    BP: sitting 111/81    SaO2: 100 RA  MODE:  Ambulation: 370 ft   POST:  Rate/Rhythm: 111 ST    BP: sitting 119/92     SaO2: 100 RA  Tolerated well, no c/o. BP and HR slightly elevated. Discussed MI, stent, Brilinta, restrictions, diet, exercise, NTG, and CRPII. Will refer to [facility]. We will await echo results for HF/low sodium.  Pt is interested in participating in Virtual Cardiac Rehab. Pt advised that Virtual Cardiac Rehab is provided at no cost to the patient.  Checklist:  1. Pt has smart device  ie smartphone and/or ipad for downloading an app  Yes 2. Reliable internet/wifi service    Yes 3. Understands how to use their smartphone and navigate within an app.  Yes  Reviewed with pt the scheduling process for virtual cardiac rehab.  Pt verbalized understanding.  Anderson, ACSM 10/13/2018 2:25 PM

## 2018-10-13 NOTE — TOC Benefit Eligibility Note (Signed)
Transition of Care [facility]) Benefit Eligibility Note    Patient Details  Name: Ahren Pettinger MRN: [id number] Date of Birth: Jan 04, 1981   Medication/Dose: BRILINTA  90 MG BID  Covered?: Yes  Tier: (NO TIER)  Prescription Coverage [facility]erred [facility]: [facility]  , 90 DAY SUPPLY FOR M/O $150.00  Spoke with Person/Company/Phone Number:: [employer]  @   [employer] AS # [phone]  Co-Pay: $ 60.00  Prior Approval: No  Deductible: Met  Additional Notes: TICAGRELOR : Crecencio Mc Phone Number: 10/13/2018, 12:14 PM

## 2018-10-13 NOTE — Progress Notes (Addendum)
**Note James-Identified via Obfuscation** Progress Note  Patient Name: James [name] Date of Encounter: 10/13/2018  Primary Cardiologist: James NoeHenry W Smith III, MD   Subjective   Feels better today.  No CP or dyspnea.  No palpitations.  Inpatient Medications    Scheduled Meds: . aspirin  81 mg Oral Daily  . atorvastatin  80 mg Oral q1800  . carvedilol  3.125 mg Oral BID WC  . losartan  25 mg Oral Daily  . sodium chloride flush  3 mL Intravenous Q12H  . ticagrelor  90 mg Oral BID   Continuous Infusions: . sodium chloride     PRN Meds: sodium chloride, acetaminophen, ALPRAZolam, nitroGLYCERIN, ondansetron (ZOFRAN) IV, oxyCODONE, sodium chloride flush, zolpidem   Vital Signs    Vitals:   10/13/18 0600 10/13/18 0700 10/13/18 0800 10/13/18 0900  BP: (!) 120/96 (!) 128/93 (!) 121/101 (!) 118/98  Pulse:      Resp: (!) 21 20 (!) 22 (!) 23  Temp:      TempSrc:      SpO2: 100% 100% 100% 100%  Weight:      Height:        Intake/Output Summary (Last 24 hours) at 10/13/2018 0919 Last data filed at 10/13/2018 0600 Gross per 24 hour  Intake 373 ml  Output 775 ml  Net -402 ml   Last 3 Weights 10/13/2018 10/12/2018  Weight (lbs) 141 lb 15.6 oz 141 lb 1.5 oz  Weight (kg) 64.4 kg 64 kg      Telemetry    Sinus rhythm with some tachycardia.  Occasional PVCs with a least 2 short 3-4 beat runs of NSVT- Personally Reviewed  ECG    Sinus tachycardia, rate 103.  Persistent subtle ST elevations, Q waves and T wave inversions in I and aVL.  Q waves in V1 through V4 with flat ST segments.  Findings are consistent likely with evolutionary changes of anterolateral STEMI- Personally Reviewed  Physical Exam   Physical Exam  Constitutional: He is oriented to person, place, and time. He appears well-developed and well-nourished. No distress.  Healthy appearing   HENT:  Head: Normocephalic and atraumatic.  Eyes: EOM are normal.  Neck: Normal range of motion. Neck supple. No hepatojugular reflux and no JVD present. Carotid bruit  is not present.  Cardiovascular: Normal rate, regular rhythm, normal heart sounds and intact distal pulses.  No extrasystoles are present. PMI is not displaced. Exam reveals no gallop (cannot exclude soft S4), no friction rub and no decreased pulses.  No murmur heard. Pulmonary/Chest: Effort normal and breath sounds normal. No respiratory distress. He has no wheezes. He has no rales.  Abdominal: Soft. Bowel sounds are normal. He exhibits no distension. There is no abdominal tenderness. There is no rebound.  Musculoskeletal: Normal range of motion.        General: No edema.  Neurological: He is alert and oriented to person, place, and time. No cranial nerve deficit.  Psychiatric: He has a normal mood and affect. His behavior is normal. Judgment and thought content normal.  Nursing note and vitals reviewed.   Labs    High Sensitivity Troponin:   Recent Labs  Lab 10/12/18 1411 10/12/18 1547  TROPONINIHS >27,000* >27,000*      Cardiac EnzymesNo results for input(s): TROPONINI in the last 168 hours. No results for input(s): TROPIPOC in the last 168 hours.   Chemistry Recent Labs  Lab 10/12/18 1411 10/12/18 1830 10/13/18 0317  NA 138  --  136  K 4.3  --  4.0  CL 103  --  102  CO2 23  --  23  GLUCOSE 133*  --  114*  BUN 12  --  8  CREATININE 1.23 1.09 1.08  CALCIUM 9.7  --  9.2  PROT  --   --  6.9  ALBUMIN  --   --  3.9  AST  --   --  517*  ALT  --   --  102*  ALKPHOS  --   --  60  BILITOT  --   --  1.3*  GFRNONAA >60 >60 >60  GFRAA >60 >60 >60  ANIONGAP 12  --  11     Hematology Recent Labs  Lab 10/12/18 1411 10/12/18 1830 10/13/18 0317  WBC 16.1* 17.3* 16.3*  RBC 5.27 5.06 4.86  HGB 15.1 14.5 14.1  HCT 43.9 41.3 39.6  MCV 83.3 81.6 81.5  MCH 28.7 28.7 29.0  MCHC 34.4 35.1 35.6  RDW 12.1 12.1 12.3  PLT 257 237 223   Lab Results  Component Value Date   CHOL 196 10/13/2018   HDL 43 10/13/2018   LDLCALC 118 (H) 10/13/2018   TRIG 174 (H) 10/13/2018    CHOLHDL 4.6 10/13/2018   (will need to check FLP in 3 months)  BNP Recent Labs  Lab 10/13/18 0317  BNP 325.1*     DDimer No results for input(s): DDIMER in the last 168 hours.   Radiology    Dg Chest Port 1 View  Result Date: 10/12/2018 CLINICAL DATA:  Chest pain and upper back pain. EXAM: PORTABLE CHEST 1 VIEW COMPARISON:  None. FINDINGS: The heart size and mediastinal contours are within normal limits. Both lungs are clear. The visualized skeletal structures are unremarkable. IMPRESSION: Normal exam. Electronically Signed   By: James BoyersJames  Maxwell M.D.   On: 10/12/2018 14:38    Cardiac Studies    Cardiac Cath-PCI 10/12/2018: 100% ostial LAD (PCI-Resolute Onyx DES 3.5 mm x 18 mm--3.6 mm), widely patent dominant circumflex.  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  Severe LV dysfunction with anteroapical severe HK/AK EF 30 to 35%.  LVEDP 29 mmHg.==>Consistent with ischemic cardiomyopathy/acute combined systolic and diastolic heart failure   Echocardiogram pending today.  Patient Profile     38 y.o. male with somewhat delayed presentation of anterior STEMI (onset of pain 6:30 AM -was associated with diaphoresis;, arrival to ER at roughly 2 PM, troponin elevation 1-1/2 hours after arrival.  Had ongoing chest pain 5/10.  EKG revealed Q waves in V1 through V3 with resolution of prior ST elevation changes [facility]   He was taken to the cardiac catheterization lab for urgent cath and PCI.  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  With ostial LAD, would continue all DAPT with aspirin plus Brilinta for minimum 1 year and then would like to continue Thienopyridine antiplatelet agent lifelong.  High-dose/high intensity atorvastatin 80 mg daily.  Started on carvedilol and ARB --> with tachycardia, will increase Beta Blocker    Acute combined systolic and diastolic heart failure (HCC) -> clearly ischemic cardiomyopathy with anterior wall MI, echocardiogram pending. -BNP only 325  Started on carvedilol and losartan both at low doses on admission.  Titrate as tolerated based on blood pressure.  Would also consider spironolactone prior to discharge.  Pending echocardiogram results, may consider switching from losartan to [facility].  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  For now medical management, or, low threshold to consider CT angiogram for better delineation.  (This can be done as outpatient)    Leukocytosis, likely related to MI Glucose 102 --> check A1c.  With large anterior MI, will await the results of echocardiogram.  May potentially benefit from LifeVest if EF remains reduced.  Would not consider candidate for fast track discharge.  If ICU beds needed, could move to Tele later today.   For questions or updates, please contact [place] Please consult [email or web address] for contact info under    --  ADDENDUM - Echo read - ER 30-35%.  There is akinesis of the apical septal, mid and apical anteroseptal, mid and apical lateral, entire anterior and apical walls consistent with infarct. Definity contrast suggestive of early mural thrombus formation in the apex in some views.  With EF < 35% & Anterior MI - At HIGH RISK for SUDDEN CARDIAC DEATH - (anticipate potential for large anterior scar) --> Will initiate paperwork for LifeVest on d/c    Signed, Glenetta Hew, MD  10/13/2018, 9:19 AM

## 2018-10-14 ENCOUNTER — Other Ambulatory Visit: Payer: Self-pay

## 2018-10-14 LAB — BASIC METABOLIC PANEL
Anion gap: 7 (ref 5–15)
BUN: 12 mg/dL (ref 6–20)
CO2: 29 mmol/L (ref 22–32)
Calcium: 9.2 mg/dL (ref 8.9–10.3)
Chloride: 102 mmol/L (ref 98–111)
Creatinine, Ser: 1.47 mg/dL — ABNORMAL HIGH (ref 0.61–1.24)
GFR calc Af Amer: >60 mL/min (ref >60)
GFR calc non Af Amer: >60 mL/min (ref >60)
Glucose, Bld: 118 mg/dL — ABNORMAL HIGH (ref 70–99)
Potassium: 4.5 mmol/L (ref 3.5–5.1)
Sodium: 138 mmol/L (ref 135–145)

## 2018-10-14 LAB — CBC
HCT: 42.7 % (ref 39.0–52.0)
Hemoglobin: 14.5 g/dL (ref 13.0–17.0)
MCH: 28.5 pg (ref 26.0–34.0)
MCHC: 34 g/dL (ref 30.0–36.0)
MCV: 84.1 fL (ref 80.0–100.0)
Platelets: 221 10*3/uL (ref 150–400)
RBC: 5.08 MIL/uL (ref 4.22–5.81)
RDW: 12.5 % (ref 11.5–15.5)
WBC: 13.2 10*3/uL — ABNORMAL HIGH (ref 4.0–10.5)
nRBC: 0 % (ref 0.0–0.2)

## 2018-10-14 LAB — LIPOPROTEIN A (LPA): Lipoprotein (a): 35.5 nmol/L — ABNORMAL HIGH (ref <75.0)

## 2018-10-14 LAB — MAGNESIUM: Magnesium: 2.1 mg/dL (ref 1.7–2.4)

## 2018-10-14 MED ORDER — SODIUM CHLORIDE 0.9 % IV BOLUS
200.0000 mL | Freq: Once | INTRAVENOUS | Status: AC
  Administered 2018-10-14: 200 mL via INTRAVENOUS

## 2018-10-14 MED ORDER — SODIUM CHLORIDE 0.9 % IV BOLUS
250.0000 mL | Freq: Once | INTRAVENOUS | Status: AC
  Administered 2018-10-14: 250 mL via INTRAVENOUS

## 2018-10-14 MED ORDER — CARVEDILOL 3.125 MG PO TABS: 3.1250 mg | ORAL_TABLET | Freq: Two times a day (BID) | ORAL | Status: DC

## 2018-10-14 NOTE — Progress Notes (Signed)
CARDIAC REHAB PHASE I   PRE:  Rate/Rhythm: 94 SR  BP:  Supine:   Sitting: 95/73  Standing:    SaO2: 100%RA  MODE:  Ambulation: 370 ft   POST:  Rate/Rhythm: 101 ST  BP:  Supine:   Sitting: 107/78  Standing:    SaO2: 98%RA 1237-1252 Pt feeling better now. Up in chair. Pt walked 370 ft on RA with steady gait. Pt c/o some light chest pressure one half of distance. Had pt stop and rest and take some deep breaths. Pt stated it was better. Walked rest of way to room. No dizziness. Left CHF booklet and low sodium diets in room. Will review with pt on MON.    Graylon Good, RN BSN  10/14/2018 12:48 PM

## 2018-10-14 NOTE — Progress Notes (Signed)
Patient's cardiac monitor alarmed as VTACH. RN went in to check on patient and when she got in patient room, he was moving in the bed and the monitor was not picking up rhythm. Leads changed and repositioned. Patient stated he was fine at the moment with no SOB, Chest Pain or Dizziness. RN helped patient to stand to get weight and was about  to ambulate patient when patient complained he was feeling dizzy and did not think he was ready for his walk yet. Patient then had a blank stare and fell back into RN arms and RN lowered him back to bed safely. Patient however stated he has no memory of how he got back into bed as he felt he blacked out.  EKG done and showed NSR with T wave abnormality, consider lateral ischemia.  Patient's BP however dropped to 94/72 from 108/75. On Call Cardiologist paged. Awaiting response. BMET, Mg, CBC labs sent whilst waiting on MD response.    MD paged again due to no earlier response about patient concern and drop of BP to 90/69 with patient being asymptomatic. See New orders for NS bolus. Patient is in bed and states he feels better. Will continue to monitor

## 2018-10-14 NOTE — Progress Notes (Signed)
Dorian Pod with [employer] called her number is [phone]   She will connect with on call care manager to get information faxed over for approval of lifevest for patient prior to discharge

## 2018-10-14 NOTE — Care Management (Signed)
Lifevest is approved and will be delivered to patient's room at [facility] Sunday 7/26.

## 2018-10-14 NOTE — Progress Notes (Signed)
Progress Note  Patient Name: Ridgely Anastacio Date of Encounter: 10/14/2018  Primary Cardiologist: [name] III, MD   Subjective   Overnight had some brief episodes of hypotension and dizziness.  Was given fluid bolus.  Pressures remain somewhat soft. Had another brief episode of "blackout dizziness "this morning when getting up.  Blood pressure is now in the 90s.  While lying in bed he feels fine.  No chest pain or pressure.  No dyspnea.  Inpatient Medications    Scheduled Meds:  aspirin  81 mg Oral Daily   atorvastatin  80 mg Oral q1800   carvedilol  6.25 mg Oral BID WC   Chlorhexidine Gluconate Cloth  6 each Topical Daily   losartan  25 mg Oral Daily   sodium chloride flush  3 mL Intravenous Q12H   spironolactone  12.5 mg Oral Daily   ticagrelor  90 mg Oral BID   Continuous Infusions:  sodium chloride     PRN Meds: sodium chloride, acetaminophen, ALPRAZolam, nitroGLYCERIN, ondansetron (ZOFRAN) IV, oxyCODONE, sodium chloride flush, zolpidem   Vital Signs    Vitals:   10/14/18 0645 10/14/18 0700 10/14/18 0715 10/14/18 0800  BP:  91/69  92/69  Pulse: 99 99 (!) 101 98  Resp: 14 14 (!) 22 14  Temp:    98.5 F (36.9 C)  TempSrc:    Oral  SpO2: 98% 99% 99% 99%  Weight:      Height:        Intake/Output Summary (Last 24 hours) at 10/14/2018 0833 Last data filed at 10/14/2018 0800 Gross per 24 hour  Intake 448.88 ml  Output 3025 ml  Net -2576.12 ml   Last 3 Weights 10/14/2018 10/13/2018 10/12/2018  Weight (lbs) 137 lb 2 oz 141 lb 15.6 oz 141 lb 1.5 oz  Weight (kg) 62.2 kg 64.4 kg 64 kg      Telemetry    Mostly sinus rhythm in the low sinus tachycardia 90s to 110.  Minimal PVCs and only one short burst of 3-4 beats NSVT.- Personally Reviewed  ECG    Sinus rhythm, 94 bpm.  Strain right upper quadrant axis.  Still has evolving changes of anterior STEMI.  I Personally Reviewed  Physical Exam   Physical Exam  Constitutional: He is oriented to  person, place, and time. He appears well-developed and well-nourished. No distress.  Healthy appearing   HENT:  Head: Normocephalic and atraumatic.  Neck: Normal range of motion. Neck supple. No hepatojugular reflux and no JVD present. Carotid bruit is not present.  Cardiovascular: Normal rate, regular rhythm, normal heart sounds and intact distal pulses.  No extrasystoles are present. PMI is not displaced. Exam reveals no gallop (cannot exclude soft S4), no friction rub and no decreased pulses.  No murmur heard. Pulmonary/Chest: Effort normal and breath sounds normal. No respiratory distress. He has no wheezes. He has no rales.  Abdominal: Soft. Bowel sounds are normal. He exhibits no distension. There is no abdominal tenderness. There is no rebound.  Musculoskeletal: Normal range of motion.        General: No edema.  Neurological: He is alert and oriented to person, place, and time.  Skin: Skin is warm and dry. No rash noted. No erythema.  Psychiatric: He has a normal mood and affect. His behavior is normal. Judgment and thought content normal.  Nursing note and vitals reviewed.   Labs    High Sensitivity Troponin:   Recent Labs  Lab 10/12/18 1411 10/12/18 1547  TROPONINIHS >  27,000* >27,000*      Cardiac EnzymesNo results for input(s): TROPONINI in the last 168 hours. No results for input(s): TROPIPOC in the last 168 hours.   Chemistry Recent Labs  Lab 10/12/18 1411 10/12/18 1830 10/13/18 0317 10/14/18 0630  NA 138  --  136 138  K 4.3  --  4.0 4.5  CL 103  --  102 102  CO2 23  --  23 29  GLUCOSE 133*  --  114* 118*  BUN 12  --  8 12  CREATININE 1.23 1.09 1.08 1.47*  CALCIUM 9.7  --  9.2 9.2  PROT  --   --  6.9  --   ALBUMIN  --   --  3.9  --   AST  --   --  517*  --   ALT  --   --  102*  --   ALKPHOS  --   --  60  --   BILITOT  --   --  1.3*  --   GFRNONAA >60 >60 >60 >60  GFRAA >60 >60 >60 >60  ANIONGAP 12  --  11 7     Hematology Recent Labs  Lab  10/12/18 1830 10/13/18 0317 10/14/18 0630  WBC 17.3* 16.3* 13.2*  RBC 5.06 4.86 5.08  HGB 14.5 14.1 14.5  HCT 41.3 39.6 42.7  MCV 81.6 81.5 84.1  MCH 28.7 29.0 28.5  MCHC 35.1 35.6 34.0  RDW 12.1 12.3 12.5  PLT 237 223 221   Lab Results  Component Value Date   CHOL 196 10/13/2018   HDL 43 10/13/2018   LDLCALC 118 (H) 10/13/2018   TRIG 174 (H) 10/13/2018   CHOLHDL 4.6 10/13/2018   (will need to check FLP in 3 months)  BNP Recent Labs  Lab 10/13/18 0317  BNP 325.1*     DDimer No results for input(s): DDIMER in the last 168 hours.   Radiology    Dg Chest Port 1 View  Result Date: 10/12/2018 CLINICAL DATA:  Chest pain and upper back pain. EXAM: PORTABLE CHEST 1 VIEW COMPARISON:  None. FINDINGS: The heart size and mediastinal contours are within normal limits. Both lungs are clear. The visualized skeletal structures are unremarkable. IMPRESSION: Normal exam. Electronically Signed   By: Francene BoyersJames  Maxwell M.D.   On: 10/12/2018 14:38    Cardiac Studies    Cardiac Cath-PCI 10/12/2018: 100% ostial LAD (PCI-Resolute Onyx DES 3.5 mm x 18 mm--3.6 mm), widely patent dominant circumflex.  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  Severe LV dysfunction with anteroapical severe HK/AK EF 30 to 35%.  LVEDP 29 mmHg.==>Consistent with ischemic cardiomyopathy/acute combined systolic and diastolic heart failure   Echo 0/45/40987/24/2020- ER 30-35%.  There is akinesis of the apical septal, mid and apical anteroseptal, mid and apical lateral, entire anterior and apical walls consistent with infarct. Definity contrast suggestive of early mural thrombus formation in the apex in some views.  Patient Profile     38 y.o. male with somewhat delayed presentation of anterior STEMI (onset of pain 6:30 AM -was associated with diaphoresis;, arrival to ER at roughly 2 PM, troponin elevation 1-1/2 hours after arrival.  Had ongoing chest pain 5/10.  EKG  revealed Q waves in V1 through V3 with resolution of prior ST elevation changes [facility]   He was taken to the cardiac catheterization lab for urgent cath and PCI.  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  Ostial LAD lesion: Would continue DAPT with ASA/Brilinta for minimum 1 year and then would like to continue lifelong Thienopyridine antiplatelet agent (either maintenance dose Brilinta or Plavix).  High-dose/high intensity atorvastatin 80 mg daily.  Started on carvedilol and ARB --> did not tolerate increased dose of beta Blocker due to hypotension.   Hypotensive, holding ARB today.   Would not [facility] being able [facility] prior to discharge due to hypotension    Acute combined systolic and diastolic heart failure (HCC) -> clearly ISCHEMIC CARDIOMYOPATHY with anterior wall MI, echocardiogram pending. -BNP only 325 -> brisk diuresis overnight without diuretic, actually got the bolus normal saline retention  Reducing dose of carvedilol back to 3.25 mg twice daily-may need to switch to Toprol 25 mg to allow better blood pressure,  Perhaps if blood  pressures return, could otherwise try to start low-dose spironolactone, prior to discharge.  Anticipate with his current blood pressures, that we would not be able to restart ARB/Entresto prior to discharge.  Can reevaluate as outpatient.  **EF<35% with large anterior MI = at HIGH RISK for SUDDEN CARDIAC DEATH.  At HIGH RISK for SUDDEN CARDIAC DEATH -will initiate paperwork for LifeVest prior to discharge. Given his labile blood pressures, would probably opt on keeping until least Monday to allow for some mild titration of medications over the weekend.     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  For now medical management, or, low threshold to consider CT angiogram for better delineation  Unsure of how much these are affecting his current condition.  Can be evaluated in the outpatient setting.   Leukocytosis, likely related to MI, has reduced to 13.2 Glucose 102 --> A1c 5.2  With large anterior MI, will await the results of echocardiogram.  May potentially benefit from LifeVest if EF remains reduced.  Would not consider candidate for fast track discharge.  If ICU beds needed, could move to Tele later today in the afternoon if blood pressures are stable.  Would probably not be ready for discharge until Monday.   For questions or updates, please contact [facility] Please consult [email or web address] for contact info under     Signed, Bryan Lemmaavid Bethanne Mule, MD  10/14/2018, 8:33 AM

## 2018-10-15 DIAGNOSIS — Z9189 Other specified personal risk factors, not elsewhere classified: Secondary | ICD-10-CM

## 2018-10-15 LAB — BASIC METABOLIC PANEL
Anion gap: 7 (ref 5–15)
BUN: 13 mg/dL (ref 6–20)
CO2: 23 mmol/L (ref 22–32)
Calcium: 8.8 mg/dL — ABNORMAL LOW (ref 8.9–10.3)
Chloride: 107 mmol/L (ref 98–111)
Creatinine, Ser: 1.32 mg/dL — ABNORMAL HIGH (ref 0.61–1.24)
GFR calc Af Amer: >60 mL/min (ref >60)
GFR calc non Af Amer: >60 mL/min (ref >60)
Glucose, Bld: 94 mg/dL (ref 70–99)
Potassium: 4.7 mmol/L (ref 3.5–5.1)
Sodium: 137 mmol/L (ref 135–145)

## 2018-10-15 MED ORDER — METOPROLOL SUCCINATE ER 25 MG PO TB24
25.0000 mg | ORAL_TABLET | Freq: Every day | ORAL | Status: DC
  Administered 2018-10-15 – 2018-10-16 (×2): 25 mg via ORAL
  Filled 2018-10-15 (×2): qty 1

## 2018-10-15 MED ORDER — ISOSORBIDE MONONITRATE ER 30 MG PO TB24
30.0000 mg | ORAL_TABLET | Freq: Every day | ORAL | Status: DC
  Administered 2018-10-15 – 2018-10-16 (×2): 30 mg via ORAL
  Filled 2018-10-15 (×2): qty 1

## 2018-10-15 NOTE — Progress Notes (Addendum)
Progress Note  Patient Name: James [name] Date of Encounter: 10/15/2018  Primary Cardiologist: [name] III, MD   Subjective   Denies any SOB, occasional chest pain last night.   Blood pressure more stable overnight.  Less dizzy.  No further passout spells.  Feels better this morning.  --Note in chart from [facility] approved.  To be delivered today.  Inpatient Medications    Scheduled Meds:  aspirin  81 mg Oral Daily   atorvastatin  80 mg Oral q1800   carvedilol  3.125 mg Oral BID WC   Chlorhexidine Gluconate Cloth  6 each Topical Daily   sodium chloride flush  3 mL Intravenous Q12H   spironolactone  12.5 mg Oral Daily   ticagrelor  90 mg Oral BID   Continuous Infusions:  sodium chloride     PRN Meds: sodium chloride, acetaminophen, ALPRAZolam, nitroGLYCERIN, ondansetron (ZOFRAN) IV, oxyCODONE, sodium chloride flush, zolpidem   Vital Signs    Vitals:   10/14/18 1800 10/14/18 1900 10/14/18 1945 10/15/18 0636  BP: 117/76  102/74 110/68  Pulse: 100  (!) 108 92  Resp: (!) 33  18 20  Temp:  99 F (37.2 C) 99.4 F (37.4 C) 97.9 F (36.6 C)  TempSrc:  Oral Oral Oral  SpO2: 100%  100% 100%  Weight:    61.9 kg  Height:        Intake/Output Summary (Last 24 hours) at 10/15/2018 0823 Last data filed at 10/15/2018 0000 Gross per 24 hour  Intake 1195.05 ml  Output 1900 ml  Net -704.95 ml   Last 3 Weights 10/15/2018 10/14/2018 10/13/2018  Weight (lbs) 136 lb 8 oz 137 lb 2 oz 141 lb 15.6 oz  Weight (kg) 61.916 kg 62.2 kg 64.4 kg      Telemetry    Sinus tachycardia rates in the 100 210 range- Personally Reviewed  ECG    NSR with Q wave in the anterior leads - Personally Reviewed  Physical Exam   GEN: No acute distress.  Well-appearing Neck: No JVD Cardiac: RRR, no murmurs, rubs, or gallops.  Respiratory: Clear to auscultation bilaterally.  Nonlabored GI: Soft, nontender, non-distended  MS: No edema; No deformity. Neuro:   Nonfocal  Psych: Normal affect   Labs    High Sensitivity Troponin:   Recent Labs  Lab 10/12/18 1411 10/12/18 1547  TROPONINIHS >27,000* >27,000*      Cardiac EnzymesNo results for input(s): TROPONINI in the last 168 hours. No results for input(s): TROPIPOC in the last 168 hours.   Chemistry Recent Labs  Lab 10/12/18 1411 10/12/18 1830 10/13/18 0317 10/14/18 0630  NA 138  --  136 138  K 4.3  --  4.0 4.5  CL 103  --  102 102  CO2 23  --  23 29  GLUCOSE 133*  --  114* 118*  BUN 12  --  8 12  CREATININE 1.23 1.09 1.08 1.47*  CALCIUM 9.7  --  9.2 9.2  PROT  --   --  6.9  --   ALBUMIN  --   --  3.9  --   AST  --   --  517*  --   ALT  --   --  102*  --   ALKPHOS  --   --  60  --   BILITOT  --   --  1.3*  --   GFRNONAA >60 >60 >60 >60  GFRAA >60 >60 >60 >60  ANIONGAP 12  --  11 7     Hematology Recent Labs  Lab 10/12/18 1830 10/13/18 0317 10/14/18 0630  WBC 17.3* 16.3* 13.2*  RBC 5.06 4.86 5.08  HGB 14.5 14.1 14.5  HCT 41.3 39.6 42.7  MCV 81.6 81.5 84.1  MCH 28.7 29.0 28.5  MCHC 35.1 35.6 34.0  RDW 12.1 12.3 12.5  PLT 237 223 221    BNP Recent Labs  Lab 10/13/18 0317  BNP 325.1*     DDimer No results for input(s): DDIMER in the last 168 hours.   Radiology    No results found.  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  Late presenting acute anterior myocardial infarction with total occlusion of the ostial LAD  Successful stenting using 3.5 x 18 Onyx deployed at 14 atm x 2.  During initial deployment the patient took a deep breath while the balloon was being inflated which retracted the balloon into the left main.  TIMI grade III flow was noted.  Dominant widely patent circumflex.  Dominant right coronary  Severe left ventricular dysfunction with anteroapical severe hypokinesis/akinesis and EF of 30 to 35%.  These findings are  consistent with acute systolic heart failure, ischemically mediated  RECOMMENDATIONS:   Guideline directed therapy for acute LV systolic dysfunction/heart failure.  Aggressive secondary risk prevention  Long-term dual antiplatelet therapy due to stent position in the left main  Further coronary artery to pulmonary artery fistula.   Echo 10/13/2018 IMPRESSIONS  1. The left ventricle has moderate-severely reduced systolic function, with an ejection fraction of 30-35%. The cavity size was normal. Left ventricular diastolic function could not be evaluated due to nondiagnostic images.  2. There is akinesis of the apical septal, mid and apical anteroseptal, mid and apical lateral, entire anterior and apical walls consistent with infarct. Definity contrast suggestive of early mural thrombus formation in the apex in some views.  3. The right ventricle has normal systolic function. The cavity was normal. There is no increase in right ventricular wall thickness.  4. There is mild mitral annular calcification present.  5. The aorta is normal in size and structure.  Patient Profile     38 y.o. male who had late presenting anterior STEMI (onset of symptom 6:30AM, arrived in ED 2 PM), EKG showed Q wave in V1-V3.   Assessment & Plan    1. Late presenting anterior STEMI  - cath 10/12/2018 showed prox near ost LAD occlusion treated with 3.5 x 18 mm Onyx DES. Coronary to pulmonary artery fistula from both left coronary and right coronary. Nondominant RCA with fistula collaterals to LAD, EF 30-35%.   - occasional chest pain last night, add long acting nitrate  -With ongoing sinus tachycardia and borderline pressures, will convert from carvedilol to Toprol which may allow [place] to add spironolactone or ARB once we have recheck of chemistry   2. Ischemic cardiomyopathy: switch coreg to Toprol Xl.   - lifevest today, likely discharge tomorrow.   -Converting from carvedilol to Toprol -> probably with  current blood pressure unable to add ARB but may be able to add spironolactone pending chemistry results today.  3. HLD: high dose statin  4. 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 Medical management, may consider CTA as outpatient  5. AKI: pending repeat BMET   For questions or updates, please contact [facility] Please consult [email or web address] for contact info under  Signed, °Hao Meng, PA  °10/15/2018, 8:23 AM   ° ° °ATTENDING ATTESTATION ° °I have seen, examined and evaluated the patient this AM along with Mr. Meng, PA-C.  After reviewing all the available data and chart, we discussed the patients laboratory, study & physical findings as well as symptoms in detail. I agree with his findings, examination as well as impression recommendations as per our discussion.   ° °Attending adjustments noted in italics.  ° °He actually looks and feels much better today.  Some strange discomfort last night.  We will add Imdur just to make sure there is no spasm. °Is due for chemistry labs to be checked today in order to determine if we can potentially add ARB.  And when I switch him from Coreg to Toprol to allow for more blood pressure room. ° °Glad to see that LifeVest should be fitted today. ° °Would like to see him essentially prepared for discharge tomorrow.  Just need to see his blood pressure response to the Toprol, as well as Imdur.  Would also like to see if we could potentially add low-dose ARB versus spironolactone depending on renal function. ° °He seems relatively euvolemic and I do not think he needs a loop diuretic, however spironolactone may be beneficial. ° ° ° °David Harding, M.D., M.S. °Interventional Cardiologist  ° °Pager # [phone] °Phone # [phone] °[address] °[place], [place] [address] ° ° ° °

## 2018-10-16 ENCOUNTER — Encounter (HOSPITAL_COMMUNITY): Payer: Self-pay | Admitting: Cardiology

## 2018-10-16 ENCOUNTER — Telehealth: Payer: Self-pay

## 2018-10-16 MED ORDER — ASPIRIN 81 MG PO CHEW: 81.0000 mg | CHEWABLE_TABLET | Freq: Every day | ORAL | 1 refills | Status: AC

## 2018-10-16 MED ORDER — SPIRONOLACTONE 25 MG PO TABS: 12.5000 mg | ORAL_TABLET | Freq: Every day | ORAL | 1 refills | Status: DC

## 2018-10-16 MED ORDER — NITROGLYCERIN 0.4 MG SL SUBL: 0.4000 mg | SUBLINGUAL_TABLET | SUBLINGUAL | 2 refills | Status: DC | PRN

## 2018-10-16 MED ORDER — TICAGRELOR 90 MG PO TABS: 90.0000 mg | ORAL_TABLET | Freq: Two times a day (BID) | ORAL | 2 refills | Status: DC

## 2018-10-16 MED ORDER — ISOSORBIDE MONONITRATE ER 30 MG PO TB24: 30.0000 mg | ORAL_TABLET | Freq: Every day | ORAL | 1 refills | Status: DC

## 2018-10-16 MED ORDER — METOPROLOL SUCCINATE ER 25 MG PO TB24: 25.0000 mg | ORAL_TABLET | Freq: Every day | ORAL | 1 refills | Status: DC

## 2018-10-16 MED ORDER — ATORVASTATIN CALCIUM 80 MG PO TABS: 80.0000 mg | ORAL_TABLET | Freq: Every day | ORAL | 2 refills | Status: DC

## 2018-10-16 NOTE — Telephone Encounter (Signed)
-----   Message from Lindsay B Roberts, NP sent at 10/16/2018 11:35 AM EDT ----- Regarding: TOC call Needs TOC call. Discharging home today.   Thx  

## 2018-10-16 NOTE — Telephone Encounter (Signed)
**Note De-identified James [name] Obfuscation** The pt is being discharged today. We will call tomorrow. 

## 2018-10-16 NOTE — Progress Notes (Signed)
CARDIAC REHAB PHASE I   PRE:  Rate/Rhythm: 104 ST  BP:  Supine:   Sitting: 110/84  Standing:    SaO2: 100%RA  MODE:  Ambulation: 470 ft   POST:  Rate/Rhythm: 106 ST  BP:  Supine:   Sitting: 125/81  Standing:    SaO2: 100%RA 0922-1020 Pt walked 470 ft on RA with slow steady gait. Tolerated well. Chest pressure 0.5 on scale of 1-10. Reviewed signs/symptoms of CHF and importance of daily weights and 2000 mg sodium restriction. Pt voiced understanding. Has CHF booklet and reviewed zones. Gave low sodium diets.   Graylon Good, RN BSN  10/16/2018 10:16 AM

## 2018-10-16 NOTE — Care Management (Signed)
10-16-18 Brilinta co pay card provided to patient. Medications will be filled via the [facility] and delivered to the bedside. No further needs from CM at this time. Bethena Roys, RN,BSN Case Manager [phone]

## 2018-10-16 NOTE — Discharge Summary (Addendum)
Discharge Summary    Patient ID: James [name],  MRN: [id number], DOB/AGE: [birth year] 38 y.o.  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  Late presenting acute anterior myocardial infarction with total occlusion of the ostial LAD  Successful stenting using 3.5 x 18 Onyx deployed at 14 atm x 2.  During initial deployment the patient took a deep breath while the balloon was being inflated which retracted the balloon into the left main.  TIMI grade III flow was noted.  Dominant widely patent circumflex.  Dominant right coronary  Severe left ventricular dysfunction with anteroapical severe hypokinesis/akinesis and EF of 30 to 35%.  These findings are consistent with acute systolic heart failure, ischemically mediated  RECOMMENDATIONS:   Guideline directed therapy for acute LV systolic dysfunction/heart failure.  Aggressive secondary risk prevention  Long-term dual antiplatelet therapy due to stent position in the left main  Further coronary artery to pulmonary artery fistula.  TTE: 10/13/18  IMPRESSIONS    1. The left ventricle has moderate-severely reduced systolic function, with an ejection  fraction of 30-35%. The cavity size was normal. Left ventricular diastolic function could not be evaluated due to nondiagnostic images.  2. There is akinesis of the apical septal, mid and apical anteroseptal, mid and apical lateral, entire anterior and apical walls consistent with infarct. Definity contrast suggestive of early mural thrombus formation in the apex in some views.  3. The right ventricle has normal systolic function. The cavity was normal. There is no increase in right ventricular wall thickness.  4. There is mild mitral annular calcification present.  5. The aorta is normal in size and structure. _____________   History of Present Illness     38 y.o. male with a history of possible hyperlipidemia.  He had no history of hypertension, diabetes, family history of premature coronary artery disease, tobacco use, alcohol or drug abuse.  James [name] was in his usual state of health until the morning of admission.  He woke up about 5 AM with severe chest pain, 8/10.  He also had back pain at the same time.  He did not feel that the chest pain went through to his back.  He had never had this pain before.  It was associated with diaphoresis, but no nausea, vomiting, or shortness of breath.  When his symptoms did not resolve, he woke his wife.  He tried changing positions and doing other things to get rid of the pain, but did not take anything except for garlic.  When his symptoms did not resolve, he finally came to the [facility] emergency room around 1pm.  In the emergency room, he was diagnosed as a STEMI.  He was given aspirin 324 mg, 4000 unit bolus of heparin  and sublingual nitroglycerin.  The nitroglycerin helped his chest pain.  He was transported emergently to [facility] and taken to the Cath Lab.  Upon arrival to [facility], his chest pain was a 5/10.  Hospital Course     Underwent cardiac cath noted above with PCI/DES x1 to the ostial LAD 2/2 to heavy thrombus burden. During stent  deployment the patient took in a deep breath while balloon was being inflated and retracted the balloon slightly into the left main. 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 LV gram noted at 30-35% with anteroapical severe hypokinesis/akinesis. Placed on DAPT with ASA/Brilinta for at least one year. Will likely need lifelong antiplatelet therapy. Started on coreg post cath but blood pressures were soft. Switched to Toprol to allow for addition of spirolactone. Had episode of dizziness but blood pressures improved and was able to tolerate medications by the time of discharge. Follow up echo showed EF of 30-35% with akinesis of the apical septal, mid and apical anteroseptal, mid and apical lateral walls consistent with infarct. LDL noted at 118 and placed on high dose statin. Lifevest ordered and placed prior to discharge. Had some residual chest pain post cath and low dose Imdur added. Remained euvolemic on exam. Worked well with cardiac rehab. Asked that he monitor his blood pressures at home and bring to follow up appt as we may be able to further titrate his medical therapy. Given work note at discharge.   General: Well developed, well nourished, male appearing in no acute distress. Head: Normocephalic, atraumatic.  Neck: Supple without bruits, JVD. Lungs:  Resp regular and unlabored, CTA. Heart: RRR, S1, S2, no S3, S4, or murmur; no rub. Abdomen: Soft, non-tender, non-distended with normoactive bowel sounds. No hepatomegaly. No rebound/guarding. No obvious abdominal masses. Extremities: No clubbing, cyanosis, edema. Distal pedal pulses are 2+ bilaterally. Left radial cath site stable without bruising or hematoma Neuro: Alert and oriented X 3. Moves all extremities spontaneously. Psych: Normal affect.  James [name] was seen by Dr. Claiborne Billings and determined stable for discharge home. Follow up in the office has been arranged. Medications are listed below.    _____________  Discharge Vitals Blood pressure 103/77, pulse (!) 105, temperature 98.3 F (36.8 C), temperature source Oral, resp. rate 20, height 5' 5"  (1.651 m), weight 61.6 kg, SpO2 100 %.  Filed Weights   10/14/18 0500 10/15/18 0636 10/16/18 0429  Weight: 62.2 kg 61.9 kg 61.6 kg    Labs & Radiologic Studies    CBC Recent Labs    10/14/18 0630  WBC 13.2*  HGB 14.5  HCT 42.7  MCV 84.1  PLT 350   Basic Metabolic Panel Recent Labs    10/14/18 0630 10/15/18 1246  NA 138 137  K 4.5 4.7  CL 102 107  CO2 29 23  GLUCOSE 118* 94  BUN 12 13  CREATININE 1.47* 1.32*  CALCIUM 9.2 8.8*  MG 2.1  --    Liver Function Tests No results for input(s): AST, ALT, ALKPHOS, BILITOT, PROT, ALBUMIN in the last 72 hours. No results for input(s): LIPASE, AMYLASE in the last 72 hours. Cardiac Enzymes No results for input(s): CKTOTAL, CKMB, CKMBINDEX, TROPONINI in the last 72 hours. BNP Invalid input(s): POCBNP D-Dimer No results for input(s): DDIMER in the last 72 hours. Hemoglobin A1C No results for input(s): HGBA1C in the last 72 hours. Fasting Lipid Panel No results for input(s): CHOL, HDL, LDLCALC, TRIG, CHOLHDL, LDLDIRECT in the last 72 hours. Thyroid Function Tests  No results for input(s): TSH, T4TOTAL, T3FREE, THYROIDAB in the last 72 hours.  Invalid input(s): FREET3 _____________  Dg Chest Port 1 View  Result Date: 10/12/2018 CLINICAL DATA:  Chest pain and upper back pain. EXAM: PORTABLE CHEST 1 VIEW COMPARISON:  None. FINDINGS: The heart size and mediastinal contours are within normal limits. Both lungs are clear. The visualized skeletal structures are unremarkable. IMPRESSION: Normal exam. Electronically Signed   By: Lorriane Shire M.D.   On: 10/12/2018 14:38   Disposition   Pt is being discharged home today in good condition.  Follow-up Plans & Appointments    Follow-up Information    Tommie Raymond, NP Follow up on 10/31/2018.   Specialty: Cardiology Why: at  11:45am for your follow up appt.  Contact information: [place] [id number] [phone]          Discharge Instructions    Amb Referral to Cardiac Rehabilitation   Complete by: As directed    Email: [email or web address]   Diagnosis:  Coronary Stents STEMI PTCA     After initial evaluation and assessments completed: Virtual Based Care may be provided alone or in conjunction with Phase 2 Cardiac Rehab based on patient barriers.: Yes   Call MD for:  redness, tenderness, or signs of infection (pain, swelling, redness, odor or green/yellow discharge around incision site)   Complete by: As directed    Diet - low sodium heart healthy   Complete by: As directed    Discharge instructions   Complete by: As directed    Radial Site Care Refer to this sheet in the next few weeks. These instructions provide you with information on caring for yourself after your procedure. Your caregiver may also give you more specific instructions. Your treatment has been planned according to current medical practices, but problems sometimes occur. Call your caregiver if you have any problems or questions after your procedure. HOME CARE INSTRUCTIONS You may shower the day after the procedure.Remove the bandage (dressing) and gently wash the site with plain soap and water.Gently pat the site dry.  Do not apply powder or lotion to the site.  Do not submerge the affected site in water for 3 to 5 days.  Inspect the site at least twice daily.  Do not flex or bend the affected arm for 24 hours.  No lifting over 5 pounds (2.3 kg) for 5 days after your procedure.  What to expect: Any bruising will usually fade within 1 to 2 weeks.  Blood that collects in the tissue (hematoma) may be painful to the touch. It should usually decrease in size and tenderness within 1 to 2 weeks.  SEEK IMMEDIATE MEDICAL CARE IF: You have unusual pain at the radial site.  You have redness, warmth,  swelling, or pain at the radial site.  You have drainage (other than a small amount of blood on the dressing).  You have chills.  You have a fever or persistent symptoms for more than 72 hours.  You have a fever and your symptoms suddenly get worse.  Your arm becomes pale, cool, tingly, or numb.  You have heavy bleeding from the site. Hold pressure on the site.   No driving for at least 2 weeks. No lifting over 10 lbs for 4 weeks. No sexual activity for 4 weeks. You may not return to work until cleared by your cardiologist. Keep procedure site clean & dry. If you notice increased pain, swelling, bleeding or pus, call/return!  You  may shower, but no soaking baths/hot tubs/pools for 1 week.   PLEASE DO NOT MISS ANY DOSES OF YOUR BRILINTA!!!!! Also keep a log of you blood pressures and bring back to your follow up appt. Please call the office with any questions.   Patients taking blood thinners should generally stay away from medicines like ibuprofen, Advil, Motrin, naproxen, and Aleve due to risk of stomach bleeding. You may take Tylenol as directed or talk to your primary doctor about alternatives.   Increase activity slowly   Complete by: As directed      Discharge Medications     Medication List    TAKE these medications   aspirin 81 MG chewable tablet Chew 1 tablet (81 mg total) by mouth daily. Start taking on: October 17, 2018   atorvastatin 80 MG tablet Commonly known as: LIPITOR Take 1 tablet (80 mg total) by mouth daily at 6 PM.   isosorbide mononitrate 30 MG 24 hr tablet Commonly known as: IMDUR Take 1 tablet (30 mg total) by mouth daily. Start taking on: October 17, 2018   metoprolol succinate 25 MG 24 hr tablet Commonly known as: TOPROL-XL Take 1 tablet (25 mg total) by mouth daily. Start taking on: October 17, 2018   nitroGLYCERIN 0.4 MG SL tablet Commonly known as: NITROSTAT Place 1 tablet (0.4 mg total) under the tongue every 5 (five) minutes x 3 doses as needed for  chest pain.   spironolactone 25 MG tablet Commonly known as: ALDACTONE Take 0.5 tablets (12.5 mg total) by mouth daily. Start taking on: October 17, 2018   ticagrelor 90 MG Tabs tablet Commonly known as: BRILINTA Take 1 tablet (90 mg total) by mouth 2 (two) times daily.        Acute coronary syndrome (MI, NSTEMI, STEMI, etc) this admission?: Yes.     AHA/ACC Clinical Performance & Quality Measures: 1. Aspirin prescribed? - Yes 2. ADP Receptor Inhibitor (Plavix/Clopidogrel, Brilinta/Ticagrelor or Effient/Prasugrel) prescribed (includes medically managed patients)? - Yes 3. Beta Blocker prescribed? - Yes 4. High Intensity Statin (Lipitor 40-22m or Crestor 20-415m prescribed? - Yes 5. EF assessed during THIS hospitalization? - Yes 6. For EF <40%, was ACEI/ARB prescribed? - No - Reason:  blood pressures soft. 7. For EF <40%, Aldosterone Antagonist (Spironolactone or Eplerenone) prescribed? - Yes 8. Cardiac Rehab Phase II ordered (Included Medically managed Patients)? - Yes      Outstanding Labs/Studies   FLP/LFTs in 6 weeks. Echo in 6-8 weeks to reassess EF (discharged with lifevest)  Duration of Discharge Encounter   Greater than 30 minutes including physician time.  Signed, LiReino BellisP-C 10/16/2018, 11:48 AM   Patient seen and examined. Agree with assessment and plan. Feels well today. No chest pain. Walking without discomfort or dizziness. Rhythm stable. Lifevest received.  For DC today with plan for additional medicine titration as outpatient as BP allows. Plan for outpatient echo in 2-3 months to re-assess LV function   ThTroy SineMD, FAPhoenix [facility]al Hospital/27/2020 1:12 PM

## 2018-10-17 NOTE — Telephone Encounter (Signed)
TCM Call-1st attempt I left a message on the pts VM asking him to call me back.

## 2018-10-18 ENCOUNTER — Telehealth (HOSPITAL_COMMUNITY): Payer: Self-pay

## 2018-10-18 NOTE — Progress Notes (Signed)
Cardiology Office Note   Date:  10/31/2018   ID:  James [name], DOB [birth year], MRN [id number]  PCP:  Patient, No Pcp Per  Cardiologist:  Sinclair Grooms, MD  Chief Complaint  Patient presents with  . Hospitalization Follow-up    History of Present Illness: James [name] is a 38 y.o. male who presents for post hospital follow up. He has a prior hx of possible hyperlipidemia. He had no history of hypertension, diabetes, family history of premature coronary artery disease, tobacco use, alcohol or drug abuse.  He reported being in his usual state of health until the morning of admission. He woke up about 5 AM with severe chest pain, 8/10. He also had back pain at the same time. He did not feel that the chest pain went through to his back. He had never had this pain before. It was associated with diaphoresis, but no nausea, vomiting, or shortness of breath.  When his symptoms did not resolve, he woke his wife. He tried changing positions and doing other things to get rid of the pain, but did not take anything except for garlic.  When his symptoms did not resolve, he finally came to the [facility] emergency room around 1pm. In the emergency room, he was diagnosed as a STEMI. He was given aspirin 324 mg, 4000 unit bolus of heparin and sublingual nitroglycerin. The nitroglycerin helped his chest pain. He was transported emergently to [facility] and taken to the Cath Lab. Upon arrival to [facility], his chest pain was a 5/10.  Underwent cardiac cath noted above with PCI/DES x1 to the ostial LAD 2/2 to heavy thrombus burden. During stent deployment the patient took in a deep breath while balloon was being inflated and retracted the balloon slightly into the left main. 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 LV gram noted at 30-35% with anteroapical severe hypokinesis/akinesis. Placed on DAPT with ASA/Brilinta for at least one year. Will  likely need lifelong antiplatelet therapy. Started on coreg post cath but blood pressures were soft. Switched to Toprol to allow for addition of spirolactone. Had episode of dizziness but blood pressures improved and was able to tolerate medications by the time of discharge. Follow up echo showed EF of 30-35% with akinesis of the apical septal, mid and apical anteroseptal, mid and apical lateral walls consistent with infarct. LDL noted at 118 and placed on high dose statin. Lifevest ordered and placed prior to discharge. Had some residual chest pain post cath and low dose Imdur added. Worked well with cardiac rehab.   Today, he reports having very poor sleep since hospital discharge likely related to anxiety.  He is started walking short distances.  Initially, this was going well however last Saturday he went for 10-minute walk and experienced mild exertional chest pain (presenting chest discomfort was described as unbearable).  He reports it lasted approximately 1 hour and dissipated on its own.  He did not take SL NTG.  He has had some issues with "hertburn" as well and was offered an Rx of Protonix however prefers to change his diet first and monitor symptoms.  He comes with lots of questions regarding insurance, short-term disability, medications, cardiac rehab and nutrition. He denies shortness of breath, LE swelling, palpitations, orthopnea or syncope.  He has had no recurrent chest discomfort since his previous episode.  He continues to wear his LifeVest.  He had issues with the system downloading while at home.  We had [employer] fitting rep, April, come to his room for system download during office visit.  This was successful.  He reports that this is not fired since discharge.  Past Medical History:  Diagnosis Date  . Acute combined systolic and diastolic heart failure ([place]) 11/11/18  . Acute ST elevation myocardial infarction (STEMI) ([place]) 10/12/2018  . At risk for sudden cardiac death 11-Nov-2018  .  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 Coronary artery disease involving native coronary artery of native heart with unstable angina pectoris ([place]) 2018/11/11  . Delayed presentation of acute anterolateral STEMI 10/12/2018  . Hyperlipidemia    possible elevated triglycerides  . Hyperlipidemia with target LDL less than 70 11/11/2018  . Ischemic cardiomyopathy    EF 30-35%  . Presence of drug coated stent in LAD coronary artery 11/11/18   Ostial LAD DES PCI: Resolute Onyx 3.5 mm x 18 mm - 3.6 mm  . STEMI (ST elevation myocardial infarction) ([place]) 10/12/2018  . STEMI involving left anterior descending coronary artery ([place]) 10/12/2018    Past Surgical History:  Procedure Laterality Date  . CORONARY/GRAFT ACUTE MI REVASCULARIZATION N/A 10/12/2018   Procedure: CORONARY/GRAFT ACUTE MI REVASCULARIZATION;  Surgeon: [name], MD;  Location: [place] [facility];  Service: Cardiovascular;  Laterality: N/A;  . LEFT HEART CATH AND CORONARY ANGIOGRAPHY N/A 10/12/2018   Procedure: LEFT HEART CATH AND CORONARY ANGIOGRAPHY;  Surgeon: [name], MD;  Location: [place] [facility];  Service: Cardiovascular;  Laterality: N/A;     Current Outpatient Medications  Medication Sig Dispense Refill  . aspirin 81 MG chewable tablet Chew 1 tablet (81 mg total) by mouth daily. 90 tablet 1  . atorvastatin (LIPITOR) 80 MG tablet Take 1 tablet (80 mg total) by mouth daily at 6 PM. 90 tablet 2  . isosorbide mononitrate (IMDUR) 30 MG 24 hr tablet Take 1 tablet (30 mg total) by mouth daily. 30 tablet 1  . metoprolol succinate (TOPROL-XL) 25 MG 24 hr tablet Take 1 tablet (25 mg total) by mouth daily. 90 tablet 1  . nitroGLYCERIN (NITROSTAT) 0.4 MG SL tablet Place 1 tablet (0.4 mg total) under the tongue every 5 (five) minutes x 3 doses as needed for chest pain. 25 tablet 2  . spironolactone (ALDACTONE) 25 MG tablet Take 0.5 tablets (12.5 mg total) by mouth daily. 30 tablet 1  . ticagrelor (BRILINTA)  90 MG TABS tablet Take 1 tablet (90 mg total) by mouth 2 (two) times daily. 180 tablet 2   No current facility-administered medications for this visit.     Allergies:   Patient has no known allergies.    Social History:  The patient  reports that he has never smoked. He has never used smokeless tobacco. He reports current alcohol use of about 2.0 standard drinks of alcohol per week. He reports that he does not use drugs.   Family History:  The patient's family history is not on file.    ROS:  Please see the history of present illness. Otherwise, review of systems are positive for none.   All other systems are reviewed and negative.    PHYSICAL EXAM: VS:  BP 100/78   Pulse 90   Ht _0  (1.651 m)   Wt 138 lb (62.6 kg)   SpO2 98%   BMI 22.96 kg/m  , BMI Body mass index is 22.96 kg/m.   General: Well developed, well nourished, NAD Neck: Negative for carotid bruits. No JVD Lungs:Clear  to ausculation bilaterally. No wheezes, rales, or rhonchi. Breathing is unlabored. Cardiovascular: RRR with S1 S2. No murmurs, rubs, gallops, or LV heave appreciated. Extremities: No edema. No clubbing or cyanosis. DP/PT pulses 2+ bilaterally Neuro: Alert and oriented. No focal deficits. No facial asymmetry. MAE spontaneously. Psych: Responds to questions appropriately with normal affect.     EKG:  EKG is ordered today. The ekg ordered today demonstrates NSR with TWI in leads aVL, V3.  Q waves present in V1 and V2.  HR, 90 bpm   Recent Labs: 10/13/2018: ALT 102; B Natriuretic Peptide 325.1; TSH 3.547 10/14/2018: Hemoglobin 14.5; Magnesium 2.1; Platelets 221 10/15/2018: BUN 13; Creatinine, Ser 1.32; Potassium 4.7; Sodium 137    Lipid Panel    Component Value Date/Time   CHOL 196 10/13/2018 0317   TRIG 174 (H) 10/13/2018 0317   HDL 43 10/13/2018 0317   CHOLHDL 4.6 10/13/2018 0317   VLDL 35 10/13/2018 0317   LDLCALC 118 (H) 10/13/2018 0317     Wt Readings from Last 3 Encounters:  10/31/18  138 lb (62.6 kg)  10/16/18 135 lb 12.8 oz (61.6 kg)     Other studies Reviewed: Additional studies/ records that were reviewed today include:   Cath: 10/12/18   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  Late presenting acute anterior myocardial infarction with total occlusion of the ostial LAD  Successful stenting using 3.5 x 18 Onyx deployed at 14 atm x 2. During initial deployment the patient took a deep breath while the balloon was being inflated which retracted the balloon into the left main. TIMI grade III flow was noted.  Dominant widely patent circumflex.  Dominant right coronary  Severe left ventricular dysfunction with anteroapical severe hypokinesis/akinesis and EF of 30 to 35%. These findings are consistent with acute systolic heart failure, ischemically mediated  RECOMMENDATIONS:   Guideline directed therapy for acute LV systolic dysfunction/heart failure.  Aggressive secondary risk prevention  Long-term dual antiplatelet therapy due to stent position in the left main  Further coronary artery to pulmonary artery fistula.  TTE: 10/13/18  IMPRESSIONS   1. The left ventricle has moderate-severely reduced systolic function, with an ejection fraction of 30-35%. The cavity size was normal. Left ventricular diastolic function could not be evaluated due to nondiagnostic images. 2. There is akinesis of the apical septal, mid and apical anteroseptal, mid and apical lateral, entire anterior and apical walls consistent with infarct. Definity contrast suggestive of early mural thrombus formation in the apex in some views. 3. The right ventricle has normal systolic function. The cavity was normal. There is no increase in right ventricular wall thickness. 4. There is mild mitral annular calcification present. 5. The aorta is normal in size and structure.  ASSESSMENT AND PLAN:  1.  Late presenting STEMI: -Cath performed 10/12/2018 showed prox near ost LAD occlusion treated with 3.5 x 18 mm Onyx DES. Coronary to pulmonary artery fistula from both left coronary and right coronary. Nondominant RCA with fistula collaterals to LAD, EF 30-35% -Continue ASA, Brilinta for at least 1 year>>possible life long DAPT -Continue Toprol>> unable to uptitrate secondary to BP -Imdur added for residual chest pain prior to d/c -Continues to have residual chest discomfort, could consider decreasing Toprol and increasing Imdur -Reiterated SLNTG administration and ED precautions if chest pain worsens, becomes progressive or unrelieved with rest or NTG -Unable to add ARB at this time secondary to BP -We will check renal function today and  have close follow-up. -If renal function stable, again, could decrease Toprol and add low-dose ARB for ICM  2. Ischemic cardiomyopathy: -LifeVest in place without firing  -Will repeat echo in 6-8 weeks to reassess EF (discharged with lifevest)  -Plan for late September>> schedule at next office visit  3. HLD: -LDL, 118>>continue high dose statin  -FLP/LFTs in 6 weeks   4. 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  -Medical management, may consider CTA as outpatient  5. AKI:  -Creatinine at hospital discharge, 1.32 -Will repeat today  -Peak creatinine, 1.47   Current medicines are reviewed at length with the patient today.  The patient does not have concerns regarding medicines.  The following changes have been made:  no change  Labs/ tests ordered today include: CBC, BMET  Orders Placed This Encounter  Procedures  . Basic metabolic panel  . CBC  . Ambulatory referral to Nutrition and Diabetic Education  . EKG 12-Lead    Disposition:   FU with Dr. Tamala Julian in 2 weeks   Signed, Kathyrn Drown, NP  10/31/2018 12:55 PM    [address], [place], [place]  [address] Phone: [phone]; Fax: [phone]

## 2018-10-18 NOTE — Telephone Encounter (Signed)
**Note De-Identified James [name] Obfuscation** Patient and his wife contacted regarding the pts discharge from [facility] on 10/16/2018  Patient understands to follow up with provider Kathyrn Drown, NP on 10/31/2018 at 11:45 at [facility] in [place]. Patient understands discharge instructions? Yes Patient understands medications and regiment? Yes Patient understands to bring all medications to this visit? Yes

## 2018-10-18 NOTE — Telephone Encounter (Signed)
Pt insurance is active and benefits verified through [employer] Co-pay 0, DED $600/$600 met, out of pocket $3,900/$1,377.93 met, co-insurance 80%. no pre-authorization required, REF# [id number]  Will contact patient to see if he is interested in the Cardiac Rehab Program. If interested, patient will need to complete follow up appt. Once completed, patient will be contacted for scheduling upon review by the RN Navigator.

## 2018-10-19 NOTE — Telephone Encounter (Deleted)
-----   Message from Cheryln Manly, NP sent at 10/16/2018 11:35 AM EDT ----- Regarding: TOC call Needs TOC call. Discharging home today.   Thx

## 2018-10-19 NOTE — Telephone Encounter (Signed)
TOC CALL TOMORROW ./CY

## 2018-10-19 NOTE — Telephone Encounter (Signed)
-----   Message from Lindsay B Roberts, NP sent at 10/16/2018 11:35 AM EDT ----- Regarding: TOC call Needs TOC call. Discharging home today.   Thx  

## 2018-10-19 NOTE — Telephone Encounter (Deleted)
-----   Message from Lindsay B Roberts, NP sent at 10/16/2018 11:35 AM EDT ----- Regarding: TOC call Needs TOC call. Discharging home today.   Thx  

## 2018-10-30 ENCOUNTER — Telehealth: Payer: Self-pay | Admitting: Cardiology

## 2018-10-30 NOTE — Telephone Encounter (Signed)
Pt reports having heart burn on Saturday evening but denies CP, SOB, N/V, edema or diaphoresis. Pt suspects it was from spicy food that he ate a few hours prior. He has an appt scheduled tmrw and would like to discuss with Sharee Pimple then.

## 2018-10-30 NOTE — Telephone Encounter (Signed)
Patient's wife called stating her husband had surgery on 7/23, yesterday he had some heart burn and his heart was "itchy". She wants to know it that is normal or of that is something to worry about. Patient does have appt for tomorrow at 1145am with Sharee Pimple.

## 2018-10-31 ENCOUNTER — Ambulatory Visit (INDEPENDENT_AMBULATORY_CARE_PROVIDER_SITE_OTHER): Payer: BC Managed Care – PPO | Admitting: Cardiology

## 2018-10-31 ENCOUNTER — Other Ambulatory Visit: Payer: Self-pay

## 2018-10-31 ENCOUNTER — Encounter: Payer: Self-pay | Admitting: Cardiology

## 2018-10-31 VITALS — BP 100/78 | HR 90 | Ht 65.0 in | Wt 138.0 lb

## 2018-10-31 DIAGNOSIS — I255 Ischemic cardiomyopathy: Secondary | ICD-10-CM

## 2018-10-31 DIAGNOSIS — E785 Hyperlipidemia, unspecified: Secondary | ICD-10-CM | POA: Diagnosis not present

## 2018-10-31 DIAGNOSIS — I10 Essential (primary) hypertension: Secondary | ICD-10-CM | POA: Diagnosis not present

## 2018-10-31 DIAGNOSIS — I2102 ST elevation (STEMI) myocardial infarction involving left anterior descending coronary artery: Secondary | ICD-10-CM | POA: Diagnosis not present

## 2018-10-31 NOTE — Patient Instructions (Addendum)
Medication Instructions:  Your physician recommends that you continue on your current medications as directed. Please refer to the Current Medication list given to you today.   *Nitroglycerin: Place 1 tablet (0.4 mg total) under the tongue every 5 (five) minutes x 3 doses as needed for chest pain.,     If you need a refill on your cardiac medications before your next appointment, please call your pharmacy.   Lab work: TODAY: BMET, CBC If you have labs (blood work) drawn today and your tests are completely normal, you will receive your results only by: Marland Kitchen MyChart Message (if you have MyChart) OR . A paper copy in the mail If you have any lab test that is abnormal or we need to change your treatment, we will call you to review the results.  Testing/Procedures: NONE  Follow-Up: At [facility], you and your health needs are our priority.  As part of our continuing mission to provide you with exceptional heart care, we have created designated Provider Care Teams.  These Care Teams include your primary Cardiologist (physician) and Advanced Practice Providers (APPs -  Physician Assistants and Nurse Practitioners) who all work together to provide you with the care you need, when you need it. You will need a follow up appointment in 2 weeks.   You may see Sinclair Grooms, MD or one of the following Advanced Practice Providers on your designated Care Team:   Truitt Merle, NP Cecilie Kicks, NP . Kathyrn Drown, NP  You have been referred for a Nutrition consult. They will call you with an appointment.   Any Other Special Instructions Will Be Listed Below (If Applicable).

## 2018-11-01 LAB — CBC
Hematocrit: 43.7 % (ref 37.5–51.0)
Hemoglobin: 14.9 g/dL (ref 13.0–17.7)
MCH: 28.4 pg (ref 26.6–33.0)
MCHC: 34.1 g/dL (ref 31.5–35.7)
MCV: 83 fL (ref 79–97)
Platelets: 318 10*3/uL (ref 150–450)
RBC: 5.24 x10E6/uL (ref 4.14–5.80)
RDW: 12.8 % (ref 11.6–15.4)
WBC: 6.9 10*3/uL (ref 3.4–10.8)

## 2018-11-01 LAB — BASIC METABOLIC PANEL
BUN/Creatinine Ratio: 7 — ABNORMAL LOW (ref 9–20)
BUN: 8 mg/dL (ref 6–20)
CO2: 23 mmol/L (ref 20–29)
Calcium: 9.8 mg/dL (ref 8.7–10.2)
Chloride: 100 mmol/L (ref 96–106)
Creatinine, Ser: 1.15 mg/dL (ref 0.76–1.27)
GFR calc Af Amer: 93 mL/min/{1.73_m2} (ref >59)
GFR calc non Af Amer: 81 mL/min/{1.73_m2} (ref >59)
Glucose: 89 mg/dL (ref 65–99)
Potassium: 4.6 mmol/L (ref 3.5–5.2)
Sodium: 139 mmol/L (ref 134–144)

## 2018-11-11 ENCOUNTER — Emergency Department (HOSPITAL_COMMUNITY): Payer: BC Managed Care – PPO

## 2018-11-11 ENCOUNTER — Other Ambulatory Visit: Payer: Self-pay

## 2018-11-11 ENCOUNTER — Inpatient Hospital Stay (HOSPITAL_COMMUNITY)
Admission: EM | Admit: 2018-11-11 | Discharge: 2018-11-14 | DRG: 287 | Disposition: A | Discharge status: Home / Self Care | Payer: BC Managed Care – PPO | Attending: Cardiovascular Disease | Admitting: Cardiovascular Disease

## 2018-11-11 ENCOUNTER — Telehealth: Payer: Self-pay | Admitting: Interventional Cardiology

## 2018-11-11 DIAGNOSIS — Z9189 Other specified personal risk factors, not elsewhere classified: Secondary | ICD-10-CM

## 2018-11-11 DIAGNOSIS — I255 Ischemic cardiomyopathy: Secondary | ICD-10-CM | POA: Diagnosis present

## 2018-11-11 DIAGNOSIS — I2101 ST elevation (STEMI) myocardial infarction involving left main coronary artery: Secondary | ICD-10-CM | POA: Diagnosis not present

## 2018-11-11 DIAGNOSIS — I25110 Atherosclerotic heart disease of native coronary artery with unstable angina pectoris: Secondary | ICD-10-CM | POA: Diagnosis not present

## 2018-11-11 DIAGNOSIS — E785 Hyperlipidemia, unspecified: Secondary | ICD-10-CM | POA: Diagnosis present

## 2018-11-11 DIAGNOSIS — Z955 Presence of coronary angioplasty implant and graft: Secondary | ICD-10-CM | POA: Diagnosis not present

## 2018-11-11 DIAGNOSIS — Q245 Malformation of coronary vessels: Secondary | ICD-10-CM | POA: Diagnosis not present

## 2018-11-11 DIAGNOSIS — R072 Precordial pain: Secondary | ICD-10-CM | POA: Diagnosis not present

## 2018-11-11 DIAGNOSIS — Z20828 Contact with and (suspected) exposure to other viral communicable diseases: Secondary | ICD-10-CM | POA: Diagnosis not present

## 2018-11-11 DIAGNOSIS — I252 Old myocardial infarction: Secondary | ICD-10-CM

## 2018-11-11 DIAGNOSIS — I5042 Chronic combined systolic (congestive) and diastolic (congestive) heart failure: Secondary | ICD-10-CM | POA: Diagnosis not present

## 2018-11-11 DIAGNOSIS — I259 Chronic ischemic heart disease, unspecified: Secondary | ICD-10-CM

## 2018-11-11 DIAGNOSIS — I214 Non-ST elevation (NSTEMI) myocardial infarction: Secondary | ICD-10-CM | POA: Diagnosis not present

## 2018-11-11 DIAGNOSIS — F419 Anxiety disorder, unspecified: Secondary | ICD-10-CM | POA: Diagnosis not present

## 2018-11-11 DIAGNOSIS — R9431 Abnormal electrocardiogram [ECG] [EKG]: Secondary | ICD-10-CM | POA: Diagnosis not present

## 2018-11-11 DIAGNOSIS — R079 Chest pain, unspecified: Secondary | ICD-10-CM | POA: Diagnosis not present

## 2018-11-11 LAB — CBC
HCT: 43 % (ref 39.0–52.0)
Hemoglobin: 14.8 g/dL (ref 13.0–17.0)
MCH: 28.5 pg (ref 26.0–34.0)
MCHC: 34.4 g/dL (ref 30.0–36.0)
MCV: 82.9 fL (ref 80.0–100.0)
Platelets: 205 10*3/uL (ref 150–400)
RBC: 5.19 MIL/uL (ref 4.22–5.81)
RDW: 12.2 % (ref 11.5–15.5)
WBC: 8.7 10*3/uL (ref 4.0–10.5)
nRBC: 0 % (ref 0.0–0.2)

## 2018-11-11 LAB — BASIC METABOLIC PANEL
Anion gap: 13 (ref 5–15)
BUN: 10 mg/dL (ref 6–20)
CO2: 21 mmol/L — ABNORMAL LOW (ref 22–32)
Calcium: 9.4 mg/dL (ref 8.9–10.3)
Chloride: 105 mmol/L (ref 98–111)
Creatinine, Ser: 1.18 mg/dL (ref 0.61–1.24)
GFR calc Af Amer: >60 mL/min (ref >60)
GFR calc non Af Amer: >60 mL/min (ref >60)
Glucose, Bld: 111 mg/dL — ABNORMAL HIGH (ref 70–99)
Potassium: 3.8 mmol/L (ref 3.5–5.1)
Sodium: 139 mmol/L (ref 135–145)

## 2018-11-11 LAB — TROPONIN I (HIGH SENSITIVITY)
Troponin I (High Sensitivity): 51 ng/L — ABNORMAL HIGH (ref <18)
Troponin I (High Sensitivity): 56 ng/L — ABNORMAL HIGH (ref <18)

## 2018-11-11 MED ORDER — ONDANSETRON HCL 4 MG/2ML IJ SOLN: 4.0000 mg | Freq: Four times a day (QID) | INTRAMUSCULAR | Status: DC | PRN

## 2018-11-11 MED ORDER — ASPIRIN 81 MG PO CHEW
324.0000 mg | CHEWABLE_TABLET | ORAL | Status: AC
  Administered 2018-11-11: 324 mg via ORAL
  Filled 2018-11-11: qty 4

## 2018-11-11 MED ORDER — TICAGRELOR 90 MG PO TABS
90.0000 mg | ORAL_TABLET | Freq: Two times a day (BID) | ORAL | Status: DC
  Administered 2018-11-11 – 2018-11-14 (×7): 90 mg via ORAL
  Filled 2018-11-11 (×7): qty 1

## 2018-11-11 MED ORDER — ISOSORBIDE MONONITRATE ER 30 MG PO TB24
30.0000 mg | ORAL_TABLET | Freq: Every day | ORAL | Status: DC
  Administered 2018-11-11 – 2018-11-14 (×4): 30 mg via ORAL
  Filled 2018-11-11 (×4): qty 1

## 2018-11-11 MED ORDER — FENTANYL CITRATE (PF) 100 MCG/2ML IJ SOLN
50.0000 ug | Freq: Once | INTRAMUSCULAR | Status: AC
  Administered 2018-11-11: 50 ug via INTRAVENOUS
  Filled 2018-11-11: qty 2

## 2018-11-11 MED ORDER — SODIUM CHLORIDE 0.9% FLUSH
3.0000 mL | Freq: Once | INTRAVENOUS | Status: AC
  Administered 2018-11-11: 3 mL via INTRAVENOUS

## 2018-11-11 MED ORDER — SPIRONOLACTONE 12.5 MG HALF TABLET
12.5000 mg | ORAL_TABLET | Freq: Every day | ORAL | Status: DC
  Administered 2018-11-11 – 2018-11-14 (×4): 12.5 mg via ORAL
  Filled 2018-11-11 (×4): qty 1

## 2018-11-11 MED ORDER — NITROGLYCERIN 0.4 MG SL SUBL: 0.4000 mg | SUBLINGUAL_TABLET | SUBLINGUAL | Status: DC | PRN

## 2018-11-11 MED ORDER — ACETAMINOPHEN 325 MG PO TABS: 650.0000 mg | ORAL_TABLET | ORAL | Status: DC | PRN

## 2018-11-11 MED ORDER — METOPROLOL SUCCINATE ER 25 MG PO TB24
25.0000 mg | ORAL_TABLET | Freq: Every day | ORAL | Status: DC
  Administered 2018-11-11: 25 mg via ORAL
  Filled 2018-11-11 (×2): qty 1

## 2018-11-11 MED ORDER — ASPIRIN EC 81 MG PO TBEC
81.0000 mg | DELAYED_RELEASE_TABLET | Freq: Every day | ORAL | Status: DC
  Administered 2018-11-12 – 2018-11-14 (×3): 81 mg via ORAL
  Filled 2018-11-11 (×4): qty 1

## 2018-11-11 MED ORDER — ATORVASTATIN CALCIUM 80 MG PO TABS
80.0000 mg | ORAL_TABLET | Freq: Every day | ORAL | Status: DC
  Administered 2018-11-11 – 2018-11-12 (×2): 80 mg via ORAL
  Filled 2018-11-11 (×3): qty 1

## 2018-11-11 MED ORDER — PANTOPRAZOLE SODIUM 40 MG PO TBEC
40.0000 mg | DELAYED_RELEASE_TABLET | Freq: Every day | ORAL | Status: DC
  Administered 2018-11-11 – 2018-11-14 (×4): 40 mg via ORAL
  Filled 2018-11-11 (×4): qty 1

## 2018-11-11 MED ORDER — ASPIRIN 300 MG RE SUPP: 300.0000 mg | RECTAL | Status: AC

## 2018-11-11 MED ORDER — IBUPROFEN 600 MG PO TABS
600.0000 mg | ORAL_TABLET | Freq: Three times a day (TID) | ORAL | Status: DC
  Administered 2018-11-11 – 2018-11-13 (×7): 600 mg via ORAL
  Filled 2018-11-11 (×7): qty 1

## 2018-11-11 MED ORDER — ASPIRIN 81 MG PO CHEW: 81.0000 mg | CHEWABLE_TABLET | Freq: Every day | ORAL | Status: DC

## 2018-11-11 MED ORDER — SODIUM CHLORIDE 0.9% FLUSH
3.0000 mL | Freq: Two times a day (BID) | INTRAVENOUS | Status: DC
  Administered 2018-11-11 – 2018-11-13 (×4): 3 mL via INTRAVENOUS

## 2018-11-11 NOTE — ED Notes (Addendum)
ED TO INPATIENT HANDOFF REPORT  ED Nurse Name and Phone #: [phone]  S Name/Age/Gender Akiva De BurrsKumar Schwanke 38 y.o. male Room/Bed: 016C/016C  Code Status   Code Status: Prior  Home/SNF/Other Home Patient oriented to: self, place, time and situation Is this baseline? Yes   Triage Complete: Triage complete  Chief Complaint Chest pain  Triage Note Onset of CP on Friday, has taken nitro, gives relief for about 2 hours then pain returns. Left sided sharp pain, intermittent. Some burning. Hx of MI 4 weeks ago with stent.    Allergies No Known Allergies  Level of Care/Admitting Diagnosis ED Disposition    ED Disposition Condition Comment   Admit  [facility] [[id number][facility]  Level of Care: Telemetry Cardiac [103]  Covid Evaluation: Asymptomatic Screening Protocol (No Symptoms)  Diagnosis: Chest pain [119147][[id number]]  Admitting Physician: Vesta MixerNAHSER, PHILIP J [phone][[id number]]  Attending Physician: Vesta MixerNAHSER, PHILIP J [phone][[id number]]  Estimated length of stay: past midnight tomorrow  Certification:: I certify this patient will need inpatient services for at least 2 midnights  PT Class (Do Not Modify): Inpatient [101]  PT Acc Code (Do Not Modify): Private [1]       B Medical/Surgery History Past Medical History:  Diagnosis Date  . Acute combined systolic and diastolic heart failure ([facility]) 10/13/2018  . Acute ST elevation myocardial infarction (STEMI) ([facility]) 10/12/2018  . At risk for sudden cardiac death 10/13/2018  . 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 Coronary artery disease involving native coronary artery of native heart with unstable angina pectoris ([facility]) 10/13/2018  . Delayed presentation of acute anterolateral STEMI 10/12/2018  . Hyperlipidemia    possible elevated triglycerides  . Hyperlipidemia with target LDL less than 70 10/13/2018  . Ischemic cardiomyopathy    EF 30-35%  . Presence of drug coated stent in LAD coronary artery 10/13/2018   Ostial LAD DES  PCI: Resolute Onyx 3.5 mm x 18 mm - 3.6 mm  . STEMI (ST elevation myocardial infarction) ([facility]) 10/12/2018  . STEMI involving left anterior descending coronary artery ([facility]) 10/12/2018   Past Surgical History:  Procedure Laterality Date  . CORONARY/GRAFT ACUTE MI REVASCULARIZATION N/A 10/12/2018   Procedure: CORONARY/GRAFT ACUTE MI REVASCULARIZATION;  Surgeon: Lyn RecordsSmith, Henry W, MD;  [facility];  Service: Cardiovascular;  Laterality: N/A;  . LEFT HEART CATH AND CORONARY ANGIOGRAPHY N/A 10/12/2018   Procedure: LEFT HEART CATH AND CORONARY ANGIOGRAPHY;  Surgeon: Lyn RecordsSmith, Henry W, MD;  Location: [facility];  Service: Cardiovascular;  Laterality: N/A;     A IV Location/Drains/Wounds Patient Lines/Drains/Airways Status   Active Line/Drains/Airways    Name:   Placement date:   Placement time:   Site:   Days:   Peripheral IV 11/11/18 Right Hand   11/11/18    0640    Hand   less than 1          Intake/Output Last 24 hours No intake or output data in the 24 hours ending 11/11/18 1047  Labs/Imaging Results for orders placed or performed during the hospital encounter of 11/11/18 (from the past 48 hour(s))  Basic metabolic panel     Status: Abnormal   Collection Time: 11/11/18  3:35 AM  Result Value Ref Range   Sodium 139 135 - 145 mmol/L   Potassium 3.8 3.5 - 5.1 mmol/L   Chloride 105 98 - 111 mmol/L   CO2 21 (L) 22 - 32 mmol/L   Glucose, Bld 111 (H) 70 -  99 mg/dL   BUN 10 6 - 20 mg/dL   Creatinine, Ser 1.18 0.61 - 1.24 mg/dL   Calcium 9.4 8.9 - 10.3 mg/dL   GFR calc non Af Amer >60 >60 mL/min   GFR calc Af Amer >60 >60 mL/min   Anion gap 13 5 - 15    Comment: Performed at [facility] [address] [place] [address]  CBC     Status: None   Collection Time: 11/11/18  3:35 AM  Result Value Ref Range   WBC 8.7 4.0 - 10.5 K/uL   RBC 5.19 4.22 - 5.81 MIL/uL   Hemoglobin 14.8 13.0 - 17.0 g/dL   HCT 43.0 39.0 - 52.0 %   MCV 82.9 80.0 - 100.0 fL    MCH 28.5 26.0 - 34.0 pg   MCHC 34.4 30.0 - 36.0 g/dL   RDW 12.2 11.5 - 15.5 %   Platelets 205 150 - 400 K/uL   nRBC 0.0 0.0 - 0.2 %    Comment: Performed at [facility], [place] [address], [place], [place] [address]  Troponin I (High Sensitivity)     Status: Abnormal   Collection Time: 11/11/18  3:35 AM  Result Value Ref Range   Troponin I (High Sensitivity) 51 (H) <18 ng/L    Comment: (NOTE) Elevated high sensitivity troponin I (hsTnI) values and significant  changes across serial measurements may suggest ACS but many other  chronic and acute conditions are known to elevate hsTnI results.  Refer to the "Links" section for chest pain algorithms and additional  guidance. Performed at [facility], [place] [address] [place], [place] [address]   Troponin I (High Sensitivity)     Status: Abnormal   Collection Time: 11/11/18  5:44 AM  Result Value Ref Range   Troponin I (High Sensitivity) 56 (H) <18 ng/L    Comment: (NOTE) Elevated high sensitivity troponin I (hsTnI) values and significant  changes across serial measurements may suggest ACS but many other  chronic and acute conditions are known to elevate hsTnI results.  Refer to the "Links" section for chest pain algorithms and additional  guidance. Performed at [facility], [place] [address], [place], [place] [address]    Dg Chest 2 View  Result Date: 11/11/2018 CLINICAL DATA:  Initial evaluation for acute chest pain. EXAM: CHEST - 2 VIEW COMPARISON:  Prior radiograph from 10/12/2018. FINDINGS: The cardiac and mediastinal silhouettes are stable in size and contour, and remain within normal limits. Coronary stent noted. The lungs are normally inflated. No airspace consolidation, pleural effusion, or pulmonary edema is identified. There is no pneumothorax. No acute osseous abnormality identified. IMPRESSION: No active cardiopulmonary disease. Electronically Signed   By: Jeannine Boga M.D.   On: 11/11/2018 04:01     Pending Labs [employer] (From admission, onward)    Start     Ordered   Signed and Occupational hygienist morning,   R     Signed and Held   Signed and Held  CBC  Tomorrow morning,   R     Signed and Held          Vitals/Pain Today's Vitals   11/11/18 0759 11/11/18 0800 11/11/18 0900 11/11/18 1000  BP:  113/82 109/77 119/85  Pulse:  91 89 97  Resp:  18 20 18   Temp:      TempSrc:      SpO2:  100% 99% 100%  PainSc: 0-No pain       Isolation Precautions No active isolations  Medications Medications  nitroGLYCERIN (NITROSTAT) SL tablet 0.4 mg (has no administration in time range)  ibuprofen (ADVIL) tablet 600 mg (has no administration in time range)  pantoprazole (PROTONIX) EC tablet 40 mg (has no administration in time range)  sodium chloride flush (NS) 0.9 % injection 3 mL (has no administration in time range)  sodium chloride flush (NS) 0.9 % injection 3 mL (3 mLs Intravenous Given 11/11/18 0641)  fentaNYL (SUBLIMAZE) injection 50 mcg (50 mcg Intravenous Given 11/11/18 0645)    Mobility walks Low fall risk   Focused Assessments Cardiac Assessment Handoff:    No results found for: CKTOTAL, CKMB, CKMBINDEX, TROPONINI No results found for: DDIMER Does the Patient currently have chest pain? No      R Recommendations: See Admitting Provider Note  Report given to:   Additional Notes: Pt reports intermittent CP with palpation of chest wall His troponin levels are minimally elevated.  Admit pt to the hospital and to anticipate heart catheterization on Monday.  Will start pt on a PPI and also a nonsteroidal anti-inflammatory medication.   Pt currently not having any pain.  Will not start pt on heparin at this point but would reconsider if he starts having worsening episodes or more frequent episodes of chest pain.

## 2018-11-11 NOTE — H&P (Addendum)
History & Physical    Patient ID: James [name] MRN: [id number], DOB/AGE: [birth year]   Admit date: 11/11/2018   Primary Physician: Patient, No Pcp Per Primary Cardiologist: Sinclair Grooms, MD  Patient Profile    James [name] is a 38 y.o. male with a history of recent anterior MI with DES x1 to ostial LAD, HLD and ICM with an EF of 30 to 35% with LifeVest placement at discharge who presented to [facility] 11/11/2018 with increasing frequency of left-sided exertional chest pain. He reports that since hospital discharge he experienced one episode of exertional angina however has been more recently stable. On Thursday, he walked around his neighborhood for 15 minutes x2 and began experiencing exertional chest pain.  He states that the pain began as intermittent and progressed to more constant, rated an 8 out of 10 in severity and similar to his prior chest pain.  He denies shortness of breath, diaphoresis or nausea.  He has had no recent orthopnea or LE edema, no fevers, chills or myalgias.  He has been compliant with his Brilinta and ASA along with all other medications.  He continues to wear his LifeVest without complication.  Of note, on 10/12/2018 he presented [facility] ED where he was diagnosed as a STEMI and was transported emergently to [facility] for emergent cardiac catheterization. Cath with PCI/DES x1 to the ostial LADsecondary to heavy thrombus burden. During stent deployment the patient took in a deep breath while balloon was being inflated and retracted the balloonslightlyinto the left main. 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  He was placed on DAPT with ASA/Brilinta for at least one year with recommendations for possible lifelong antiplatelet therapy. Follow up echo showed EF of 30-35% with akinesis of the apical septal, mid and apical anteroseptal, mid and  apical lateral walls consistent with infarct. LDL noted at 118 and placed on high dose statin. Lifevest ordered and placed at discharge. Had some residual chest pain post cath and low dose Imdur added.   He was seen in follow-up on 10/31/18 by myself where he reported having very poor sleep since hospital discharge, likely related to anxiety. He had starting to walk short distances.  Initially, this was going well however had one episode after a 10-minute walk in which he experienced mild exertional chest pain (presenting chest discomfort was described as unbearable). He reports it lasted approximately 1 hour and dissipated on its own.  He did not take SL NTG.  He also had some issues with "hertburn" as well and was offered an Rx of Protonix however prefers to change his diet first and monitor symptoms.   Plan was for continued ASA, Brilinta along with Toprol and Imdur.  He was unable to be started on ARV secondary to low normal BP.   Past Medical History    Past Medical History:  Diagnosis Date  . Acute combined systolic and diastolic heart failure ([place]) 10/31/18  . Acute ST elevation myocardial infarction (STEMI) ([place]) 10/12/2018  . At risk for sudden cardiac death October 31, 2018  . 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 Coronary artery disease involving native coronary artery of native heart with unstable angina pectoris ([place]) 2018-10-31  . Delayed presentation of acute anterolateral STEMI 10/12/2018  . Hyperlipidemia    possible elevated triglycerides  . Hyperlipidemia with target LDL less than 70 10-31-2018  . Ischemic cardiomyopathy  EF 30-35%  . Presence of drug coated stent in LAD coronary artery 10/13/2018   Ostial LAD DES PCI: Resolute Onyx 3.5 mm x 18 mm - 3.6 mm  . STEMI (ST elevation myocardial infarction) ([place]) 10/12/2018  . STEMI involving left anterior descending coronary artery ([place]) 10/12/2018    Past Surgical History:  Procedure Laterality Date  .  CORONARY/GRAFT ACUTE MI REVASCULARIZATION N/A 10/12/2018   Procedure: CORONARY/GRAFT ACUTE MI REVASCULARIZATION;  Surgeon: [name], MD;  Location: [place] [facility];  Service: Cardiovascular;  Laterality: N/A;  . LEFT HEART CATH AND CORONARY ANGIOGRAPHY N/A 10/12/2018   Procedure: LEFT HEART CATH AND CORONARY ANGIOGRAPHY;  Surgeon: [name], MD;  Location: [place] [facility];  Service: Cardiovascular;  Laterality: N/A;    Allergies  No Known Allergies  History of Present Illness    On 11/11/2018, and his wife called the on-call paging service with complaints that her husband was having increased frequency of chest pain described as left-sided and exertional in nature which began Friday morning after 2 episodes of walking.  Throughout the day on Friday his chest pain progressed to more constant in nature therefore he took 1 sublingual nitroglycerin with relief however the pain returned after approximately 2 hours.  He states he spoke to Dr. Tamala Julian last Saturday via telephone and was told that if his chest pain returned, he was to present to the ED for further evaluation. Given his symptoms and recommendations, he proceeded to the ED.  In the ED, EKG with NSR, HR 89 and Q waves in anterior lateral leads similar to prior tracing from 10/14/2018. HsT found to be mildly elevated at 51>> 56.  All other lab work within normal range.  CXR with no acute cardiopulmonary disease.  Given his recent history of large MI, cardiology has been asked to admit for further work-up.  Home Medications    Prior to Admission medications   Medication Sig Start Date End Date Taking? Authorizing Provider  aspirin 81 MG chewable tablet Chew 1 tablet (81 mg total) by mouth daily. 10/17/18   Cheryln Manly, NP  atorvastatin (LIPITOR) 80 MG tablet Take 1 tablet (80 mg total) by mouth daily at 6 PM. 10/16/18   Cheryln Manly, NP  isosorbide mononitrate (IMDUR) 30 MG 24 hr tablet Take 1 tablet (30 mg total) by  mouth daily. 10/17/18   Cheryln Manly, NP  metoprolol succinate (TOPROL-XL) 25 MG 24 hr tablet Take 1 tablet (25 mg total) by mouth daily. 10/17/18   [name], MD  nitroGLYCERIN (NITROSTAT) 0.4 MG SL tablet Place 1 tablet (0.4 mg total) under the tongue every 5 (five) minutes x 3 doses as needed for chest pain. 10/16/18   Cheryln Manly, NP  spironolactone (ALDACTONE) 25 MG tablet Take 0.5 tablets (12.5 mg total) by mouth daily. 10/17/18   Cheryln Manly, NP  ticagrelor (BRILINTA) 90 MG TABS tablet Take 1 tablet (90 mg total) by mouth 2 (two) times daily. 10/16/18   Cheryln Manly, NP    Family History    Family History  Problem Relation Age of Onset  . Heart disease Neg Hx     Social History    Social History   Socioeconomic History  . Marital status: Married    Spouse name: Not on file  . Number of children: Not on file  . Years of education: Not on file  . Highest education level: Not on file  Occupational History  .  Occupation: Building control surveyor: [employer]   Social Needs  . Financial resource strain: Not on file  . Food insecurity    Worry: Not on file    Inability: Not on file  . Transportation needs    Medical: Not on file    Non-medical: Not on file  Tobacco Use  . Smoking status: Never Smoker  . Smokeless tobacco: Never Used  Substance and Sexual Activity  . Alcohol use: Yes    Alcohol/week: 2.0 standard drinks    Types: 2 Cans of beer per week    Comment: 2 packs/month   . Drug use: Never  . Sexual activity: Not on file  Lifestyle  . Physical activity    Days per week: Not on file    Minutes per session: Not on file  . Stress: Not on file  Relationships  . Social Herbalist on phone: Not on file    Gets together: Not on file    Attends religious service: Not on file    Active member of club or organization: Not on file    Attends meetings of clubs or organizations: Not on file    Relationship status: Not on file  . Intimate  partner violence    Fear of current or ex partner: Not on file    Emotionally abused: Not on file    Physically abused: Not on file    Forced sexual activity: Not on file  Other Topics Concern  . Not on file  Social History Narrative   Patient lives with wife and small child     Review of Systems   General: Denies fevers, chills, myalgias, has fatigue.  Describes poor sleep  Cardiovascular: Has exertional chest pain.  No palpitations and SOB. Denies lower extremity swelling.  Respiratory- Denies SOB, coughing or sputum production. Denies the need for extra pillows for sleep at night.  Gastrointestinal: Denies abdominal, epigastric pain, nausea and vomiting. No reports of recent heartburn. Neurological: Denies headaches, syncope, numbness, and tingling. No changes in mental status.  Has anxiety.  All other systems reviewed and are otherwise negative except as noted above.  Physical Exam    Blood pressure (!) 121/95, pulse 81, temperature 98.3 F (36.8 C), temperature source Oral, resp. rate 16, SpO2 98 %.   General: Well developed, well nourished, NAD Neck: Negative for carotid bruits. No JVD Lungs:Clear to ausculation bilaterally. No wheezes, rales, or rhonchi. Breathing is unlabored. Cardiovascular: RRR with S1 S2. No murmurs, rubs, gallops, or LV heave appreciated. Abdomen: Soft, non-tender, non-distended. No obvious abdominal masses. Extremities: No edema. No clubbing or cyanosis. DP/PT pulses 2+ bilaterally Neuro: Alert and oriented. No focal deficits. No facial asymmetry. MAE spontaneously. Psych: Responds to questions appropriately with normal affect.    Labs    Troponin (Point of Care Test) No results for input(s): TROPIPOC in the last 72 hours. No results for input(s): CKTOTAL, CKMB, TROPONINI in the last 72 hours. Lab Results  Component Value Date   WBC 8.7 11/11/2018   HGB 14.8 11/11/2018   HCT 43.0 11/11/2018   MCV 82.9 11/11/2018   PLT 205 11/11/2018     Recent Labs  Lab 11/11/18 0335  NA 139  K 3.8  CL 105  CO2 21*  BUN 10  CREATININE 1.18  CALCIUM 9.4  GLUCOSE 111*   Lab Results  Component Value Date   CHOL 196 10/13/2018   HDL 43 10/13/2018   LDLCALC 118 (H) 10/13/2018  TRIG 174 (H) 10/13/2018   No results found for: [facility]   Radiology Studies    Dg Chest 2 View  Result Date: 11/11/2018 CLINICAL DATA:  Initial evaluation for acute chest pain. EXAM: CHEST - 2 VIEW COMPARISON:  Prior radiograph from 10/12/2018. FINDINGS: The cardiac and mediastinal silhouettes are stable in size and contour, and remain within normal limits. Coronary stent noted. The lungs are normally inflated. No airspace consolidation, pleural effusion, or pulmonary edema is identified. There is no pneumothorax. No acute osseous abnormality identified. IMPRESSION: No active cardiopulmonary disease. Electronically Signed   By: Jeannine Boga M.D.   On: 11/11/2018 04:01   Dg Chest Port 1 View  Result Date: 10/12/2018 CLINICAL DATA:  Chest pain and upper back pain. EXAM: PORTABLE CHEST 1 VIEW COMPARISON:  None. FINDINGS: The heart size and mediastinal contours are within normal limits. Both lungs are clear. The visualized skeletal structures are unremarkable. IMPRESSION: Normal exam. Electronically Signed   By: Lorriane Shire M.D.   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  Late presenting acute anterior myocardial infarction with total occlusion of the ostial LAD  Successful stenting using 3.5 x 18 Onyx deployed at 14 atm x 2. During initial deployment the patient took a deep breath while the balloon was being inflated which retracted the balloon into the left main. TIMI grade III flow was noted.  Dominant widely patent circumflex.  Dominant right coronary  Severe left ventricular dysfunction with  anteroapical severe hypokinesis/akinesis and EF of 30 to 35%. These findings are consistent with acute systolic heart failure, ischemically mediated  RECOMMENDATIONS:   Guideline directed therapy for acute LV systolic dysfunction/heart failure.  Aggressive secondary risk prevention  Long-term dual antiplatelet therapy due to stent position in the left main  Further coronary artery to pulmonary artery fistula.  Echocardiogram: 10/13/18 1. The left ventricle has moderate-severely reduced systolic function, with an ejection fraction of 30-35%. The cavity size was normal. Left ventricular diastolic function could not be evaluated due to nondiagnostic images. 2. There is akinesis of the apical septal, mid and apical anteroseptal, mid and apical lateral, entire anterior and apical walls consistent with infarct. Definity contrast suggestive of early mural thrombus formation in the apex in some views. 3. The right ventricle has normal systolic function. The cavity was normal. There is no increase in right ventricular wall thickness. 4. There is mild mitral annular calcification present. 5. The aorta is normal in size and structure.  Assessment & Plan    1. Late presenting anterior STEMI 10/12/2018 with recurrent angina: -Cath performed 10/12/2018 showed prox near ost LAD occlusion treated with 3.5 x 18 mm Onyx DES. Coronary to pulmonary artery fistula from both left coronary and right coronary. Nondominant RCA with fistula collaterals to LAD, EF 30-35% -Presented to [facility] on 11/11/2018 with persistent exertional chest pain similar to prior angina which is relieved with SL NTG and rest -EKG with NSR and anterior lateral Q waves, consistent with prior tracing -HsT mildly elevated at 51>>> 56 -CXR with no acute cardiopulmonary disease -Currently chest pain-free, resting comfortably -Plan for cath for Monday a.m. for relook at stent placement given persistent angina and known issues with stent  deployment in which balloon retracted slightlyinto the left main. 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 -Continue ASA, Brilinta  -Continue Toprol>>  unable to uptitrate secondary to BP -Imdur added for residual chest pain prior to d/c>> continue -Add Protonix  -Unable to add ARB at this time secondary to BP  2. Ischemic cardiomyopathy: -Per echocardiogram with LVEF of 30 to 35% and akinesis of the apical septal, mid and apical anteroseptal, mid and apical lateral, entire anterior and apical walls consistent with infarct. Definity contrast suggestive of early mural thrombus formation in the apex in some views. -LifeVest placed at discharge>> denies firing -Plan for repeat echo in 6-8 weeks to reassess EF (discharged with lifevest)   3. HLD: -LDL, 118>>continue high dose statin  -FLP/LFTs in 6 weeks  4. 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  -Medical management, may consider CTA as outpatient   Severity of Illness: The appropriate patient status for this patient is INPATIENT. Inpatient status is judged to be reasonable and necessary in order to provide the required intensity of service to ensure the patient's safety. The patient's presenting symptoms, physical exam findings, and initial radiographic and laboratory data in the context of their chronic comorbidities is felt to place them at high risk for further clinical deterioration. Furthermore, it is not anticipated that the patient will be medically stable for discharge from the hospital within 2 midnights of admission. The following factors support the patient status of inpatient.   " The patient's presenting symptoms include chest pain . " The worrisome physical exam findings include chest pain . " The initial radiographic and laboratory data are worrisome because of elevated HsT. " The chronic co-morbidities include recent STEMI.   * I certify that at  the point of admission it is my clinical judgment that the patient will require inpatient hospital care spanning beyond 2 midnights from the point of admission due to high intensity of service, high risk for further deterioration and high frequency of surveillance required.*     Signed, Kathyrn Drown NP-C [facility] Pager: [phone] 11/11/2018, _0   Attending Note:   The patient was seen and examined.  Agree with assessment and plan as noted above.  Changes made to the above note as needed.  Patient seen and independently examined with  Kathyrn Drown, NP.   We discussed all aspects of the encounter. I agree with the assessment and plan as stated above.  1.  Chest pain: The patient presents with some episodes of chest discomfort.  These episodes are somewhat atypical and that a lot most of the episodes last for between 5 and 10 seconds.  He does have several episodes that have lasted between 5 and 10 minutes.  They are described as a burning-like sensation in the left midsternal area.  He has a history of coronary artery disease with stenting of his ostial left main.  The stent actually extends back into the left main to some degree.  His troponin levels are minimally elevated.  At this point I think our best approach is to admit him to the hospital and to anticipate heart catheterization on Monday.  We will start him on a PPI and also a nonsteroidal anti-inflammatory agent.   He is currently not having any pain.  We will not start him on heparin at this point but would have a low threshold to start him on heparin if he starts having worsening episodes or more frequent episodes of chest pain.    2.  Ischemic cardiomyopathy: The patient has an ejection fraction between 30 and 35%.  He currently has a LifeVest on.   I have  spent a total of 40 minutes with patient reviewing hospital  notes , telemetry, EKGs, labs and examining patient as well as establishing an assessment and plan that was  discussed with the patient. > 50% of time was spent in direct patient care.    Thayer Headings, Brooke Bonito., MD, [facility] 11/11/2018, 10:18 AM [address]  [place] Pager [phone]

## 2018-11-11 NOTE — ED Provider Notes (Signed)
[place] EMERGENCY DEPARTMENT Provider Note   CSN: [id number] Arrival date & time: 11/11/18  5102     History   Chief Complaint Chief Complaint  Patient presents with  . Chest Pain    HPI James [name] is a 38 y.o. male.      Chest Pain Pain location:  L chest Pain quality: sharp and throbbing   Pain quality: not radiating, not stabbing and not tearing   Pain radiates to:  Does not radiate Pain severity:  Mild Onset quality:  Gradual Timing:  Constant Relieved by:  Rest Worsened by:  Exertion Ineffective treatments:  Nitroglycerin and rest Associated symptoms: no abdominal pain   Risk factors: no aortic disease and no birth control     Past Medical History:  Diagnosis Date  . Acute combined systolic and diastolic heart failure ([place]) November 05, 2018  . Acute ST elevation myocardial infarction (STEMI) ([place]) 10/12/2018  . At risk for sudden cardiac death 2018-11-05  . 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 Coronary artery disease involving native coronary artery of native heart with unstable angina pectoris ([place]) Nov 05, 2018  . Delayed presentation of acute anterolateral STEMI 10/12/2018  . Hyperlipidemia    possible elevated triglycerides  . Hyperlipidemia with target LDL less than 70 11-05-2018  . Ischemic cardiomyopathy    EF 30-35%  . Presence of drug coated stent in LAD coronary artery 11/05/18   Ostial LAD DES PCI: Resolute Onyx 3.5 mm x 18 mm - 3.6 mm  . STEMI (ST elevation myocardial infarction) ([place]) 10/12/2018  . STEMI involving left anterior descending coronary artery ([place]) 10/12/2018    Patient Active Problem List   Diagnosis Date Noted  . Acute combined systolic and diastolic heart failure ([place]) 11-05-2018  . Coronary artery disease involving native coronary artery of native heart with unstable angina pectoris ([place]) 05-Nov-2018  . Presence of drug coated stent in LAD coronary artery 2018-11-05  . Hyperlipidemia  with target LDL less than 70 11/05/18  . At risk for sudden cardiac death 11/05/18  . Acute ST elevation myocardial infarction (STEMI) ([place]) 10/12/2018  . 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 STEMI (ST elevation myocardial infarction) ([place]) 10/12/2018  . Delayed presentation of acute anterolateral STEMI 10/12/2018    Past Surgical History:  Procedure Laterality Date  . CORONARY/GRAFT ACUTE MI REVASCULARIZATION N/A 10/12/2018   Procedure: CORONARY/GRAFT ACUTE MI REVASCULARIZATION;  Surgeon: [name], MD;  Location: [facility];  Service: Cardiovascular;  Laterality: N/A;  . LEFT HEART CATH AND CORONARY ANGIOGRAPHY N/A 10/12/2018   Procedure: LEFT HEART CATH AND CORONARY ANGIOGRAPHY;  Surgeon: [name], MD;  Location: [facility];  Service: Cardiovascular;  Laterality: N/A;        Home Medications    Prior to Admission medications   Medication Sig Start Date End Date Taking? Authorizing Provider  aspirin 81 MG chewable tablet Chew 1 tablet (81 mg total) by mouth daily. 10/17/18   Cheryln Manly, NP  atorvastatin (LIPITOR) 80 MG tablet Take 1 tablet (80 mg total) by mouth daily at 6 PM. 10/16/18   Cheryln Manly, NP  isosorbide mononitrate (IMDUR) 30 MG 24 hr tablet Take 1 tablet (30 mg total) by mouth daily. 10/17/18   Cheryln Manly, NP  metoprolol succinate (TOPROL-XL) 25 MG 24 hr tablet Take 1 tablet (25 mg total) by mouth daily. 10/17/18   [name], MD  nitroGLYCERIN (NITROSTAT) 0.4  MG SL tablet Place 1 tablet (0.4 mg total) under the tongue every 5 (five) minutes x 3 doses as needed for chest pain. 10/16/18   Arty Baumgartneroberts, Lindsay B, NP  spironolactone (ALDACTONE) 25 MG tablet Take 0.5 tablets (12.5 mg total) by mouth daily. 10/17/18   Arty Baumgartneroberts, Lindsay B, NP  ticagrelor (BRILINTA) 90 MG TABS tablet Take 1 tablet (90 mg total) by mouth 2 (two) times daily. 10/16/18   Arty Baumgartneroberts, Lindsay B, NP    Family History Family  History  Problem Relation Age of Onset  . Heart disease Neg Hx     Social History Social History   Tobacco Use  . Smoking status: Never Smoker  . Smokeless tobacco: Never Used  Substance Use Topics  . Alcohol use: Yes    Alcohol/week: 2.0 standard drinks    Types: 2 Cans of beer per week    Comment: 2 packs/month   . Drug use: Never     Allergies   Patient has no known allergies.   Review of Systems Review of Systems  Cardiovascular: Positive for chest pain.  Gastrointestinal: Negative for abdominal pain.  All other systems reviewed and are negative.    Physical Exam Updated Vital Signs BP (!) 121/95   Pulse 81   Temp 98.3 F (36.8 C) (Oral)   Resp 16   SpO2 98%   Physical Exam Vitals signs and nursing note reviewed.  Constitutional:      Appearance: He is well-developed.  HENT:     Head: Normocephalic and atraumatic.     Mouth/Throat:     Mouth: Mucous membranes are dry.     Pharynx: Oropharynx is clear.  Eyes:     Conjunctiva/sclera: Conjunctivae normal.  Neck:     Musculoskeletal: Normal range of motion.  Cardiovascular:     Rate and Rhythm: Normal rate.  Pulmonary:     Effort: Pulmonary effort is normal. No respiratory distress.  Abdominal:     General: There is no distension.  Musculoskeletal: Normal range of motion.        General: No swelling or tenderness.  Skin:    General: Skin is warm and dry.  Neurological:     General: No focal deficit present.     Mental Status: He is alert.      ED Treatments / Results  Labs (all labs ordered are listed, but only abnormal results are displayed) Labs Reviewed  BASIC METABOLIC PANEL - Abnormal; Notable for the following components:      Result Value   CO2 21 (*)    Glucose, Bld 111 (*)    All other components within normal limits  TROPONIN I (HIGH SENSITIVITY) - Abnormal; Notable for the following components:   Troponin I (High Sensitivity) 51 (*)    All other components within normal  limits  TROPONIN I (HIGH SENSITIVITY) - Abnormal; Notable for the following components:   Troponin I (High Sensitivity) 56 (*)    All other components within normal limits  CBC    EKG EKG Interpretation  Date/Time:  Saturday November 11 2018 03:20:46 EDT Ventricular Rate:  89 PR Interval:  132 QRS Duration: 86 QT Interval:  330 QTC Calculation: 401 R Axis:   -169 Text Interpretation:  Normal sinus rhythm Anterolateral infarct , age undetermined Abnormal ECG No significant change since last tracing Confirmed by Marily MemosMesner, Governor Matos [phone]([id number]) on 11/11/2018 5:41:45 AM   Radiology Dg Chest 2 View  Result Date: 11/11/2018 CLINICAL DATA:  Initial evaluation for acute chest pain.  EXAM: CHEST - 2 VIEW COMPARISON:  Prior radiograph from 10/12/2018. FINDINGS: The cardiac and mediastinal silhouettes are stable in size and contour, and remain within normal limits. Coronary stent noted. The lungs are normally inflated. No airspace consolidation, pleural effusion, or pulmonary edema is identified. There is no pneumothorax. No acute osseous abnormality identified. IMPRESSION: No active cardiopulmonary disease. Electronically Signed   By: Rise MuBenjamin  McClintock M.D.   On: 11/11/2018 04:01    Procedures Procedures (including critical care time)  Medications Ordered in ED Medications  nitroGLYCERIN (NITROSTAT) SL tablet 0.4 mg (has no administration in time range)  sodium chloride flush (NS) 0.9 % injection 3 mL (3 mLs Intravenous Given 11/11/18 0641)  fentaNYL (SUBLIMAZE) injection 50 mcg (50 mcg Intravenous Given 11/11/18 0645)     Initial Impression / Assessment and Plan / ED Course  I have reviewed the triage vital signs and the nursing notes.  Pertinent labs & imaging results that were available during my care of the patient were reviewed by me and considered in my medical decision making (see chart for details).   Patient overall appears well however with exertional chest pain that improves with rest  and nitroglycerin and a recent cardiac stent will discuss with cardiology regarding management.  Discussed with night team and they would prefer to wait for second troponin because the first was positive it could still be lingering from his recent and STEMI.  They request that reconsult when second troponin results.  Second troponin slightly higher than the first 1.  I am not sure of what consequence this is is there still pretty similar, discussed with cardiology team they will evaluate and give recommendations for disposition.   Care transferred pending cardiology recommendations.  Final Clinical Impressions(s) / ED Diagnoses   Final diagnoses:  None    ED Discharge Orders    None       Analiese Krupka, Barbara CowerJason, MD 11/11/18 [phone]

## 2018-11-11 NOTE — ED Triage Notes (Signed)
Onset of CP on Friday, has taken nitro, gives relief for about 2 hours then pain returns. Left sided sharp pain, intermittent. Some burning. Hx of MI 4 weeks ago with stent.

## 2018-11-11 NOTE — Telephone Encounter (Signed)
Patient's wife paged on call service tonight to relay that her husband was having increasing frequency of chest pain. He described it as left sided and worsened with movement or with walking. It feels similar to pain he had earlier this month, but is happening more frequently. He took two SLNs without relief. I have advised her to have EMS bring the patient to the ER for further evaluation. Will alert Dr. Tamala Julian.   Lauren K. Marletta Lor, MD

## 2018-11-11 NOTE — ED Notes (Signed)
Report given to next shift.

## 2018-11-11 NOTE — ED Notes (Signed)
Updated wife via phone, ok to give info to wife via phone per pt

## 2018-11-12 LAB — BASIC METABOLIC PANEL
Anion gap: 10 (ref 5–15)
BUN: 10 mg/dL (ref 6–20)
CO2: 23 mmol/L (ref 22–32)
Calcium: 9.2 mg/dL (ref 8.9–10.3)
Chloride: 105 mmol/L (ref 98–111)
Creatinine, Ser: 1.48 mg/dL — ABNORMAL HIGH (ref 0.61–1.24)
GFR calc Af Amer: >60 mL/min (ref >60)
GFR calc non Af Amer: 60 mL/min — ABNORMAL LOW (ref >60)
Glucose, Bld: 91 mg/dL (ref 70–99)
Potassium: 3.6 mmol/L (ref 3.5–5.1)
Sodium: 138 mmol/L (ref 135–145)

## 2018-11-12 LAB — CBC
HCT: 43 % (ref 39.0–52.0)
Hemoglobin: 14.4 g/dL (ref 13.0–17.0)
MCH: 28.3 pg (ref 26.0–34.0)
MCHC: 33.5 g/dL (ref 30.0–36.0)
MCV: 84.5 fL (ref 80.0–100.0)
Platelets: 213 10*3/uL (ref 150–400)
RBC: 5.09 MIL/uL (ref 4.22–5.81)
RDW: 12.6 % (ref 11.5–15.5)
WBC: 9 10*3/uL (ref 4.0–10.5)
nRBC: 0 % (ref 0.0–0.2)

## 2018-11-12 MED ORDER — SODIUM CHLORIDE 0.9 % WEIGHT BASED INFUSION
1.0000 mL/kg/h | INTRAVENOUS | Status: DC
  Administered 2018-11-13: 250 mL via INTRAVENOUS

## 2018-11-12 MED ORDER — ASPIRIN 81 MG PO CHEW
81.0000 mg | CHEWABLE_TABLET | ORAL | Status: AC
  Administered 2018-11-13: 81 mg via ORAL
  Filled 2018-11-12: qty 1

## 2018-11-12 MED ORDER — SODIUM CHLORIDE 0.9 % IV SOLN: 250.0000 mL | INTRAVENOUS | Status: DC | PRN

## 2018-11-12 MED ORDER — ENOXAPARIN SODIUM 40 MG/0.4ML ~~LOC~~ SOLN
40.0000 mg | SUBCUTANEOUS | Status: DC
  Administered 2018-11-12: 40 mg via SUBCUTANEOUS
  Filled 2018-11-12: qty 0.4

## 2018-11-12 MED ORDER — SODIUM CHLORIDE 0.9 % WEIGHT BASED INFUSION
3.0000 mL/kg/h | INTRAVENOUS | Status: DC
  Administered 2018-11-13: 3 mL/kg/h via INTRAVENOUS

## 2018-11-12 MED ORDER — METOPROLOL SUCCINATE ER 25 MG PO TB24
12.5000 mg | ORAL_TABLET | Freq: Every day | ORAL | Status: DC
  Administered 2018-11-12 – 2018-11-14 (×3): 12.5 mg via ORAL
  Filled 2018-11-12 (×2): qty 1

## 2018-11-12 MED ORDER — SODIUM CHLORIDE 0.9% FLUSH: 3.0000 mL | INTRAVENOUS | Status: DC | PRN

## 2018-11-12 NOTE — Progress Notes (Signed)
Attempted to get pt to sign cath consent. Pt was unsure if he wanted to do cath. Stated "If I feel better tomorrow can I cancel"? I told him to speak with the doctor before he signed consent if he was unsure if he wanted procedure. Continue to monitor. Carroll Kinds RN

## 2018-11-12 NOTE — Progress Notes (Signed)
 Progress Note  Patient Name: James [name] Date of Encounter: 11/12/2018  Primary Cardiologist: James W Smith III, MD   Subjective   Currently feeling well without chest pain or shortness of breath  Inpatient Medications    Scheduled Meds: . aspirin EC  81 mg Oral Daily  . atorvastatin  80 mg Oral q1800  . ibuprofen  600 mg Oral TID  . isosorbide mononitrate  30 mg Oral Daily  . metoprolol succinate  25 mg Oral Daily  . pantoprazole  40 mg Oral Daily  . sodium chloride flush  3 mL Intravenous Q12H  . spironolactone  12.5 mg Oral Daily  . ticagrelor  90 mg Oral BID   Continuous Infusions:  PRN Meds: acetaminophen, nitroGLYCERIN, ondansetron (ZOFRAN) IV   Vital Signs    Vitals:   11/11/18 1155 11/11/18 1419 11/11/18 1947 11/12/18 0338  BP: 118/82 115/85 101/67 97/72  Pulse: 93 96 84 84  Resp: 17 16 16 16  Temp: 98.2 F (36.8 C) 98.3 F (36.8 C) 98.3 F (36.8 C) 97.8 F (36.6 C)  TempSrc: Oral Oral Oral Oral  SpO2: 100% 100% 100% 98%  Weight:    58.1 kg  Height:        Intake/Output Summary (Last 24 hours) at 11/12/2018 0824 Last data filed at 11/11/2018 2119 Gross per 24 hour  Intake 803 ml  Output -  Net 803 ml   Last 3 Weights 11/12/2018 11/11/2018 10/31/2018  Weight (lbs) 128 lb 129 lb 3.2 oz 138 lb  Weight (kg) 58.06 kg 58.605 kg 62.596 kg      Telemetry    Sinus rhythm- Personally Reviewed  ECG    None new- Personally Reviewed  Physical Exam   GEN: No acute distress.   Neck: No JVD Cardiac: RRR, no murmurs, rubs, or gallops.  Respiratory: Clear to auscultation bilaterally. GI: Soft, nontender, non-distended  MS: No edema; No deformity. Neuro:  Nonfocal  Psych: Normal affect   Labs    High Sensitivity Troponin:   Recent Labs  Lab 11/11/18 0335 11/11/18 0544  TROPONINIHS 51* 56*      Cardiac EnzymesNo results for input(s): TROPONINI in the last 168 hours. No results for input(s): TROPIPOC in the last 168 hours.    Chemistry Recent Labs  Lab 11/11/18 0335 11/12/18 0333  NA 139 138  K 3.8 3.6  CL 105 105  CO2 21* 23  GLUCOSE 111* 91  BUN 10 10  CREATININE 1.18 1.48*  CALCIUM 9.4 9.2  GFRNONAA >60 60*  GFRAA >60 >60  ANIONGAP 13 10     Hematology Recent Labs  Lab 11/11/18 0335 11/12/18 0333  WBC 8.7 9.0  RBC 5.19 5.09  HGB 14.8 14.4  HCT 43.0 43.0  MCV 82.9 84.5  MCH 28.5 28.3  MCHC 34.4 33.5  RDW 12.2 12.6  PLT 205 213    BNPNo results for input(s): BNP, PROBNP in the last 168 hours.   DDimer No results for input(s): DDIMER in the last 168 hours.   Radiology    Dg Chest 2 View  Result Date: 11/11/2018 CLINICAL DATA:  Initial evaluation for acute chest pain. EXAM: CHEST - 2 VIEW COMPARISON:  Prior radiograph from 10/12/2018. FINDINGS: The cardiac and mediastinal silhouettes are stable in size and contour, and remain within normal limits. Coronary stent noted. The lungs are normally inflated. No airspace consolidation, pleural effusion, or pulmonary edema is identified. There is no pneumothorax. No acute osseous abnormality identified. IMPRESSION: No active cardiopulmonary   disease. Electronically Signed   By: James [name] M.D.   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  Late presenting acute anterior myocardial infarction with total occlusion of the ostial LAD  Successful stenting using 3.5 x 18 Onyx deployed at 14 atm x 2.  During initial deployment the patient took a deep breath while the balloon was being inflated which retracted the balloon into the left main.  TIMI grade [name] flow was noted.  Dominant widely patent circumflex.  Dominant right coronary  Severe left ventricular dysfunction with anteroapical severe hypokinesis/akinesis and EF of 30 to 35%.  These findings are consistent with acute systolic heart failure, ischemically  mediated  TTE   1. The left ventricle has moderate-severely reduced systolic function, with an ejection fraction of 30-35%. The cavity size was normal. Left ventricular diastolic function could not be evaluated due to nondiagnostic images.  2. There is akinesis of the apical septal, mid and apical anteroseptal, mid and apical lateral, entire anterior and apical walls consistent with infarct. Definity contrast suggestive of early mural thrombus formation in the apex in some views.  3. The right ventricle has normal systolic function. The cavity was normal. There is no increase in right ventricular wall thickness.  4. There is mild mitral annular calcification present.  5. The aorta is normal in size and structure. Patient Profile     38 y.o. male history of recent anterior MI presented to the hospital with chest pain and unstable angina.  Assessment & Plan    1. Late presenting anterior STEMI 10/12/2018 with recurrent angina: Cath with proximal ostial LAD occlusion status post drug-eluting stent.  Presented with persistent exertional chest pain.  Plan for left heart catheterization tomorrow.  Has had no further chest pain since admission.  The patient understands that risks include but are not limited to stroke (1 in 1000), death (1 in 36), kidney failure [usually temporary] (1 in 500), bleeding (1 in 200), allergic reaction [possibly serious] (1 in 200), and agrees to proceed.   2. Ischemic cardiomyopathy: Ejection fraction 30 to 35% with mid apical anteroseptal akinesis.  Does have a LifeVest which James [name] need to be replaced at discharge.  James [name] need an echocardiogram 3 months post initial stenting with possible ICD implant.  3. 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  For questions or updates, please contact James [name] Please consult [email or web address] for contact info under        Signed, James Crill Meredith Leeds, MD  11/12/2018, 8:24 AM

## 2018-11-13 ENCOUNTER — Encounter (HOSPITAL_COMMUNITY)
Admission: EM | Disposition: A | Discharge status: Home / Self Care | Payer: Self-pay | Source: Home / Self Care | Attending: Cardiovascular Disease

## 2018-11-13 DIAGNOSIS — I214 Non-ST elevation (NSTEMI) myocardial infarction: Secondary | ICD-10-CM

## 2018-11-13 DIAGNOSIS — Q245 Malformation of coronary vessels: Secondary | ICD-10-CM

## 2018-11-13 DIAGNOSIS — E785 Hyperlipidemia, unspecified: Secondary | ICD-10-CM

## 2018-11-13 DIAGNOSIS — I200 Unstable angina: Secondary | ICD-10-CM | POA: Insufficient documentation

## 2018-11-13 DIAGNOSIS — I25110 Atherosclerotic heart disease of native coronary artery with unstable angina pectoris: Principal | ICD-10-CM

## 2018-11-13 DIAGNOSIS — R072 Precordial pain: Secondary | ICD-10-CM

## 2018-11-13 HISTORY — PX: RIGHT/LEFT HEART CATH AND CORONARY ANGIOGRAPHY: CATH118266

## 2018-11-13 LAB — POCT I-STAT EG7
Acid-base deficit: 4 mmol/L — ABNORMAL HIGH (ref 0.0–2.0)
Acid-base deficit: 4 mmol/L — ABNORMAL HIGH (ref 0.0–2.0)
Acid-base deficit: 4 mmol/L — ABNORMAL HIGH (ref 0.0–2.0)
Acid-base deficit: 4 mmol/L — ABNORMAL HIGH (ref 0.0–2.0)
Acid-base deficit: 4 mmol/L — ABNORMAL HIGH (ref 0.0–2.0)
Acid-base deficit: 4 mmol/L — ABNORMAL HIGH (ref 0.0–2.0)
Acid-base deficit: 5 mmol/L — ABNORMAL HIGH (ref 0.0–2.0)
Acid-base deficit: 5 mmol/L — ABNORMAL HIGH (ref 0.0–2.0)
Bicarbonate: 20 mmol/L (ref 20.0–28.0)
Bicarbonate: 20.1 mmol/L (ref 20.0–28.0)
Bicarbonate: 20.2 mmol/L (ref 20.0–28.0)
Bicarbonate: 20.5 mmol/L (ref 20.0–28.0)
Bicarbonate: 20.5 mmol/L (ref 20.0–28.0)
Bicarbonate: 20.8 mmol/L (ref 20.0–28.0)
Bicarbonate: 20.9 mmol/L (ref 20.0–28.0)
Bicarbonate: 20.9 mmol/L (ref 20.0–28.0)
Calcium, Ion: 1.18 mmol/L (ref 1.15–1.40)
Calcium, Ion: 1.23 mmol/L (ref 1.15–1.40)
Calcium, Ion: 1.23 mmol/L (ref 1.15–1.40)
Calcium, Ion: 1.24 mmol/L (ref 1.15–1.40)
Calcium, Ion: 1.24 mmol/L (ref 1.15–1.40)
Calcium, Ion: 1.24 mmol/L (ref 1.15–1.40)
Calcium, Ion: 1.25 mmol/L (ref 1.15–1.40)
Calcium, Ion: 1.25 mmol/L (ref 1.15–1.40)
HCT: 35 % — ABNORMAL LOW (ref 39.0–52.0)
HCT: 39 % (ref 39.0–52.0)
HCT: 39 % (ref 39.0–52.0)
HCT: 39 % (ref 39.0–52.0)
HCT: 39 % (ref 39.0–52.0)
HCT: 39 % (ref 39.0–52.0)
HCT: 40 % (ref 39.0–52.0)
HCT: 40 % (ref 39.0–52.0)
Hemoglobin: 11.9 g/dL — ABNORMAL LOW (ref 13.0–17.0)
Hemoglobin: 13.3 g/dL (ref 13.0–17.0)
Hemoglobin: 13.3 g/dL (ref 13.0–17.0)
Hemoglobin: 13.3 g/dL (ref 13.0–17.0)
Hemoglobin: 13.3 g/dL (ref 13.0–17.0)
Hemoglobin: 13.3 g/dL (ref 13.0–17.0)
Hemoglobin: 13.6 g/dL (ref 13.0–17.0)
Hemoglobin: 13.6 g/dL (ref 13.0–17.0)
O2 Saturation: 75 %
O2 Saturation: 76 %
O2 Saturation: 76 %
O2 Saturation: 78 %
O2 Saturation: 79 %
O2 Saturation: 85 %
O2 Saturation: 85 %
O2 Saturation: 97 %
Potassium: 3.7 mmol/L (ref 3.5–5.1)
Potassium: 3.7 mmol/L (ref 3.5–5.1)
Potassium: 3.7 mmol/L (ref 3.5–5.1)
Potassium: 3.7 mmol/L (ref 3.5–5.1)
Potassium: 3.7 mmol/L (ref 3.5–5.1)
Potassium: 3.8 mmol/L (ref 3.5–5.1)
Potassium: 3.8 mmol/L (ref 3.5–5.1)
Potassium: 3.8 mmol/L (ref 3.5–5.1)
Sodium: 142 mmol/L (ref 135–145)
Sodium: 142 mmol/L (ref 135–145)
Sodium: 142 mmol/L (ref 135–145)
Sodium: 142 mmol/L (ref 135–145)
Sodium: 143 mmol/L (ref 135–145)
Sodium: 143 mmol/L (ref 135–145)
Sodium: 143 mmol/L (ref 135–145)
Sodium: 143 mmol/L (ref 135–145)
TCO2: 21 mmol/L — ABNORMAL LOW (ref 22–32)
TCO2: 21 mmol/L — ABNORMAL LOW (ref 22–32)
TCO2: 21 mmol/L — ABNORMAL LOW (ref 22–32)
TCO2: 22 mmol/L (ref 22–32)
TCO2: 22 mmol/L (ref 22–32)
TCO2: 22 mmol/L (ref 22–32)
TCO2: 22 mmol/L (ref 22–32)
TCO2: 22 mmol/L (ref 22–32)
pCO2, Ven: 33.8 mmHg — ABNORMAL LOW (ref 44.0–60.0)
pCO2, Ven: 35.2 mmHg — ABNORMAL LOW (ref 44.0–60.0)
pCO2, Ven: 35.7 mmHg — ABNORMAL LOW (ref 44.0–60.0)
pCO2, Ven: 35.9 mmHg — ABNORMAL LOW (ref 44.0–60.0)
pCO2, Ven: 36.5 mmHg — ABNORMAL LOW (ref 44.0–60.0)
pCO2, Ven: 37.4 mmHg — ABNORMAL LOW (ref 44.0–60.0)
pCO2, Ven: 37.4 mmHg — ABNORMAL LOW (ref 44.0–60.0)
pCO2, Ven: 37.7 mmHg — ABNORMAL LOW (ref 44.0–60.0)
pH, Ven: 7.351 (ref 7.250–7.430)
pH, Ven: 7.353 (ref 7.250–7.430)
pH, Ven: 7.355 (ref 7.250–7.430)
pH, Ven: 7.356 (ref 7.250–7.430)
pH, Ven: 7.356 (ref 7.250–7.430)
pH, Ven: 7.364 (ref 7.250–7.430)
pH, Ven: 7.366 (ref 7.250–7.430)
pH, Ven: 7.383 (ref 7.250–7.430)
pO2, Ven: 41 mmHg (ref 32.0–45.0)
pO2, Ven: 42 mmHg (ref 32.0–45.0)
pO2, Ven: 42 mmHg (ref 32.0–45.0)
pO2, Ven: 44 mmHg (ref 32.0–45.0)
pO2, Ven: 45 mmHg (ref 32.0–45.0)
pO2, Ven: 51 mmHg — ABNORMAL HIGH (ref 32.0–45.0)
pO2, Ven: 52 mmHg — ABNORMAL HIGH (ref 32.0–45.0)
pO2, Ven: 94 mmHg — ABNORMAL HIGH (ref 32.0–45.0)

## 2018-11-13 LAB — SARS CORONAVIRUS 2 BY RT PCR (HOSPITAL ORDER, PERFORMED IN ~~LOC~~ HOSPITAL LAB): SARS Coronavirus 2: NEGATIVE

## 2018-11-13 SURGERY — RIGHT/LEFT HEART CATH AND CORONARY ANGIOGRAPHY
Anesthesia: LOCAL

## 2018-11-13 MED ORDER — IOHEXOL 350 MG/ML SOLN
INTRAVENOUS | Status: DC | PRN
  Administered 2018-11-13: 55 mL via INTRA_ARTERIAL

## 2018-11-13 MED ORDER — LIDOCAINE HCL (PF) 1 % IJ SOLN
INTRAMUSCULAR | Status: AC
  Filled 2018-11-13: qty 30

## 2018-11-13 MED ORDER — FENTANYL CITRATE (PF) 100 MCG/2ML IJ SOLN
INTRAMUSCULAR | Status: DC | PRN
  Administered 2018-11-13: 25 ug via INTRAVENOUS

## 2018-11-13 MED ORDER — VERAPAMIL HCL 2.5 MG/ML IV SOLN
INTRAVENOUS | Status: DC | PRN
  Administered 2018-11-13: 10 mL via INTRA_ARTERIAL

## 2018-11-13 MED ORDER — LIDOCAINE HCL (PF) 1 % IJ SOLN
INTRAMUSCULAR | Status: DC | PRN
  Administered 2018-11-13 (×2): 2 mL via SUBCUTANEOUS

## 2018-11-13 MED ORDER — ENOXAPARIN SODIUM 40 MG/0.4ML ~~LOC~~ SOLN
40.0000 mg | SUBCUTANEOUS | Status: DC
  Administered 2018-11-14: 40 mg via SUBCUTANEOUS
  Filled 2018-11-13: qty 0.4

## 2018-11-13 MED ORDER — MIDAZOLAM HCL 2 MG/2ML IJ SOLN
INTRAMUSCULAR | Status: DC | PRN
  Administered 2018-11-13: 1 mg via INTRAVENOUS

## 2018-11-13 MED ORDER — HEPARIN SODIUM (PORCINE) 1000 UNIT/ML IJ SOLN
INTRAMUSCULAR | Status: DC | PRN
  Administered 2018-11-13: 3000 [IU] via INTRAVENOUS

## 2018-11-13 MED ORDER — SODIUM CHLORIDE 0.9 % IV SOLN: INTRAVENOUS | Status: AC

## 2018-11-13 MED ORDER — MIDAZOLAM HCL 2 MG/2ML IJ SOLN
INTRAMUSCULAR | Status: AC
  Filled 2018-11-13: qty 2

## 2018-11-13 MED ORDER — HEPARIN (PORCINE) IN NACL 1000-0.9 UT/500ML-% IV SOLN
INTRAVENOUS | Status: DC | PRN
  Administered 2018-11-13 (×2): 500 mL

## 2018-11-13 MED ORDER — HEPARIN SODIUM (PORCINE) 1000 UNIT/ML IJ SOLN
INTRAMUSCULAR | Status: AC
  Filled 2018-11-13: qty 1

## 2018-11-13 MED ORDER — HEPARIN (PORCINE) IN NACL 1000-0.9 UT/500ML-% IV SOLN
INTRAVENOUS | Status: AC
  Filled 2018-11-13: qty 1000

## 2018-11-13 MED ORDER — SODIUM CHLORIDE 0.9% FLUSH: 3.0000 mL | INTRAVENOUS | Status: DC | PRN

## 2018-11-13 MED ORDER — HYDRALAZINE HCL 20 MG/ML IJ SOLN: 10.0000 mg | INTRAMUSCULAR | Status: AC | PRN

## 2018-11-13 MED ORDER — FENTANYL CITRATE (PF) 100 MCG/2ML IJ SOLN
INTRAMUSCULAR | Status: AC
  Filled 2018-11-13: qty 2

## 2018-11-13 MED ORDER — SODIUM CHLORIDE 0.9% FLUSH
3.0000 mL | Freq: Two times a day (BID) | INTRAVENOUS | Status: DC
  Administered 2018-11-14: 08:00:00 3 mL via INTRAVENOUS

## 2018-11-13 MED ORDER — SODIUM CHLORIDE 0.9 % IV SOLN: 250.0000 mL | INTRAVENOUS | Status: DC | PRN

## 2018-11-13 SURGICAL SUPPLY — 15 items
CATH 5FR JL3.5 JR4 ANG PIG MP (CATHETERS) ×1 IMPLANT
CATH BALLN WEDGE 5F 110CM (CATHETERS) ×1 IMPLANT
DEVICE RAD COMP TR BAND LRG (VASCULAR PRODUCTS) ×1 IMPLANT
GLIDESHEATH SLEND SS 6F .021 (SHEATH) ×1 IMPLANT
GUIDEWIRE .025 260CM (WIRE) ×1 IMPLANT
GUIDEWIRE ANGLED .035X260CM (WIRE) ×1 IMPLANT
GUIDEWIRE INQWIRE 1.5J.035X260 (WIRE) IMPLANT
INQWIRE 1.5J .035X260CM (WIRE) ×2
KIT HEART LEFT (KITS) ×2 IMPLANT
PACK CARDIAC CATHETERIZATION (CUSTOM PROCEDURE TRAY) ×2 IMPLANT
SHEATH GLIDE SLENDER 4/5FR (SHEATH) ×1 IMPLANT
SHEATH PROBE COVER 6X72 (BAG) ×1 IMPLANT
SYR MEDRAD MARK 7 150ML (SYRINGE) ×2 IMPLANT
TRANSDUCER W/STOPCOCK (MISCELLANEOUS) ×2 IMPLANT
TUBING CIL FLEX 10 FLL-RA (TUBING) ×2 IMPLANT

## 2018-11-13 NOTE — Discharge Summary (Signed)
Discharge Summary    Patient ID: James [name] MRN: [id number]; DOB: 08/23/1980  Admit date: 11/11/2018 Discharge date: 11/14/2018  Primary Care Provider: Patient, No Pcp Per  Primary Cardiologist: James NoeHenry [name] Smith III, MD  Primary Electrophysiologist:  None   Discharge Diagnoses    Principal Problem:   Precordial chest pain Active Problems:   Hyperlipidemia with target LDL less than 70   At risk for sudden cardiac death   Coronary artery anomaly, congenital   Cardiomyopathy, ischemic   Hx of ST elevation myocardial infarction 09/2018   Allergies No Known Allergies  Diagnostic Studies/Procedures    RIGHT/LEFT HEART CATH AND CORONARY ANGIOGRAPHY 11/13/2018  Conclusion  Conclusions: 1. Widely patent stent extending from LMCA into the proximal LAD. There is no significant stenosis at the ostium of the jailed, dominant LCx. 2. Moderately to severely reduced left ventricular systolic function with anterior akinesis. 3. Normal to low left and right heart filling pressures. 4. Normal Fick cardiac output/index. 5. No significant intracardiac shunting (Qp:Qs) < 1.2.  Recommendations: 1. Continue aggressive secondary prevention and dual antiplatelet therapy. 2. I will discontinue ibuprofen to minimize risk for bleeding and nephrotoxicity.  James Kendallhristopher End, MD   EF 30-35% LVEDP 5-10 mmHg  _____________   History of Present Illness     James Lucianne MussKumar Chaurasiais a 38 y.o.malewith a history of recent anterior MI 10/12/2018 with DES x1 to ostial LAD, HLD and ICM with an EF of 30 to 35% with LifeVest placement at discharge who [facility]2018 with increasing frequency of left-sided exertional chest pain. He reported that since hospital discharge he experienced one episode of exertional angina however has been more recently stable. On Thursday, he walked around his neighborhood for 15 minutes x2 and began experiencing exertional chest pain.  He stated that the pain  began as intermittent and progressed to more constant, rated an 8 out of 10 in severity and similar to his prior chest pain.  He denied shortness of breath, diaphoresis or nausea.  He has had no recent orthopnea or LE edema, no fevers, chills or myalgias.  He has been compliant with his Brilinta and ASA along with all other medications.  He continues to wear his LifeVest without complication.  Of note, on [facility]12/2018 [facility] where he was diagnosed as a STEMI and was [facility] emerg[facility] for emergent cardiac catheterization. Cath with PCI/DES x1 to the ostial LADsecondary to heavy thrombus burden. During stent deployment the patient took in a deep breath while balloon was being inflated and retracted the balloonslightlyinto the left main. 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  He was placed on DAPT with ASA/Brilinta for at least one year with recommendations for possible lifelong antiplatelet therapy. Follow up echo showed EF of 30-35% with akinesis of the apical septal, mid and apical anteroseptal, mid and apical lateral walls consistent with infarct. LDL noted at 118 and placed on high dose statin. Lifevest ordered and placed at discharge. Had some residual chest pain post cath and low dose Imdur added.   He was seen in follow-up on 10/31/18 by James ChardJill McDaniel, NP at which time he reported having very poor sleep since hospital discharge, likely related to anxiety. He had starting to walk short distances. Initially, this was going well however had one episode after a 10-minute walk in which he experienced mild exertional chest pain (presenting chest discomfort was described  as unbearable). He reported that it lasted approximately 1 hour and dissipated on its own. He did not take SL NTG. He also had some issues with "heartburn"as well and was offered an Rx of  Protonix however prefers to change his diet first and monitor symptoms.  Plan was for continued ASA, Brilinta along with Toprol and Imdur.  He was unable to be started on ARB secondary to low normal BP.   EKG with NSR and anterior lateral Q waves, consistent with prior tracing High sensitivity troponin mildly elevated at 51>>> 56. Troponin has not normalized since recent STEMI.   Hospital Course     Consultants: none  Pt is still having intermittent chest burning like "heartburn". He was taken for relook left heart cath today which revealed a widely patent stent extending from LMCA into the proximal LAD. There is no significant stenosis at the ostium of the jailed, dominant LCx. There is moderately to severely reduced left ventricular systolic function with anterior akinesis. Normal to low left and right heart filling pressures. He will be continued on aggressive secondary prevention and dual antiplatelet therapy. Ibuprofen is discontinued to minimize the risk of bleeding and nephrotoxicity.   Plan for repeat echo in 6-8 weeks to follow up on cardiomyopathy. Pt is wearing life vest.   Per Dr. [name], would consider getting a coronary CT-A as an outpatient to better evaluate coronary and fistula anatomy- for baseline.  Pt correlated his statin with his discomfort at times and it is now on hold. Will revisit later at office follow up.   Pt kept overnight post cath, today he is seen and evaluated by Dr. [name] and found stable for discharge. Continue lifevest.  Plan to hold statin until OV this week and place on Crestor, pt concerned lipitor was causing pain, reflux.  His LDL goal is 55 new European goals.  If still with chest pain despite off statin would consider colchicine for post MI pain. No longer able to use NSAIDS with brilinta and ASA.     Unable to add entresto today due to lower BP.  Pt will monitor at home.   _____________  Discharge Vitals Blood pressure 104/73, pulse 83,  temperature 98.3 F (36.8 C), temperature source Oral, resp. rate 15, height 5\' 5"  (1.651 m), weight 57.6 kg, SpO2 100 %.  Filed Weights   11/12/18 0338 11/13/18 0448 11/14/18 0654  Weight: 58.1 kg 57.2 kg 57.6 kg   General:Pleasant affect, NAD Skin:Warm and dry, brisk capillary refill HEENT:normocephalic, sclera clear, mucus membranes moist Heart:S1S2 RRR without murmur, gallup, rub or click Lungs:clear without rales, rhonchi, or wheezes ZJI:RCVE, non tender, + BS, do not palpate liver spleen or masses Ext:no lower ext edema, 2+ pedal pulses, 2+ radial pulses, rt radial cath site without hematoma.  Neuro:alert and oriented X 3, MAE, follows commands, + facial symmetry  Labs & Radiologic Studies    CBC Recent Labs    11/12/18 0333  11/13/18 1457 11/13/18 1515  WBC 9.0  --   --   --   HGB 14.4   < > 13.3 11.9*  HCT 43.0   < > 39.0 35.0*  MCV 84.5  --   --   --   PLT 213  --   --   --    < > = values in this interval not displayed.   Basic Metabolic Panel Recent Labs    11/12/18 0333  11/13/18 1515 11/14/18 0359  NA 138   < > 142 138  K 3.6   < > 3.7 3.6  CL 105  --   --  108  CO2 23  --   --  21*  GLUCOSE 91  --   --  81  BUN 10  --   --  8  CREATININE 1.48*  --   --  1.22  CALCIUM 9.2  --   --  8.9   < > = values in this interval not displayed.   Liver Function Tests No results for input(s): AST, ALT, ALKPHOS, BILITOT, PROT, ALBUMIN in the last 72 hours. No results for input(s): LIPASE, AMYLASE in the last 72 hours. High Sensitivity Troponin:   Recent Labs  Lab 11/11/18 0335 11/11/18 0544  TROPONINIHS 51* 56*    BNP Invalid input(s): POCBNP D-Dimer No results for input(s): DDIMER in the last 72 hours. Hemoglobin A1C No results for input(s): HGBA1C in the last 72 hours. Fasting Lipid Panel No results for input(s): CHOL, HDL, LDLCALC, TRIG, CHOLHDL, LDLDIRECT in the last 72 hours. Thyroid Function Tests No results for input(s): TSH, T4TOTAL, T3FREE,  THYROIDAB in the last 72 hours.  Invalid input(s): FREET3 _____________  Dg Chest 2 View  Result Date: 11/11/2018 CLINICAL DATA:  Initial evaluation for acute chest pain. EXAM: CHEST - 2 VIEW COMPARISON:  Prior radiograph from 10/12/2018. FINDINGS: The cardiac and mediastinal silhouettes are stable in size and contour, and remain within normal limits. Coronary stent noted. The lungs are normally inflated. No airspace consolidation, pleural effusion, or pulmonary edema is identified. There is no pneumothorax. No acute osseous abnormality identified. IMPRESSION: No active cardiopulmonary disease. Electronically Signed   By: Rise MuBenjamin  [name] M.D.   On: 11/11/2018 04:01   Disposition   Pt is being discharged home today in good condition.  Follow-up Plans & Appointments    Call [facility] [address] at [phone] if any bleeding, swelling or drainage at cath site.  May shower, no tub baths for 48 hours for groin sticks. No lifting over 5 pounds for 3 days.  No Driving for 3 days  Wear Lifevest  We added Protonix to help if indigestion caused pain. Holding cholesterol meds until appt to see if this may cause chest pain. Call if any questions.  Follow-up Information    James [name], James W, MD Follow up on 11/16/2018.   Specialty: Cardiology Why: with his NP James [name] at 3:30 pm Contact information: [address] [place] [address] [phone]            Discharge Medications   Allergies as of 11/14/2018   No Known Allergies     Medication List    STOP taking these medications   atorvastatin 80 MG tablet Commonly known as: LIPITOR     TAKE these medications   acetaminophen 325 MG tablet Commonly known as: TYLENOL Take 2 tablets (650 mg total) by mouth every 4 (four) hours as needed for headache or mild pain.   aspirin 81 MG chewable tablet Chew 1 tablet (81 mg total) by mouth daily.   isosorbide mononitrate 30 MG 24 hr tablet  Commonly known as: IMDUR Take 1 tablet (30 mg total) by mouth daily.   metoprolol succinate 25 MG 24 hr tablet Commonly known as: TOPROL-XL Take 0.5 tablets (12.5 mg total) by mouth daily. Start taking on: November 15, 2018 What changed: how much to take   nitroGLYCERIN 0.4 MG SL tablet Commonly known as: NITROSTAT Place 1 tablet (0.4 mg total) under the tongue every  5 (five) minutes x 3 doses as needed for chest pain.   pantoprazole 40 MG tablet Commonly known as: PROTONIX Take 1 tablet (40 mg total) by mouth daily. Start taking on: November 15, 2018   spironolactone 25 MG tablet Commonly known as: ALDACTONE Take 0.5 tablets (12.5 mg total) by mouth daily.   ticagrelor 90 MG Tabs tablet Commonly known as: BRILINTA Take 1 tablet (90 mg total) by mouth 2 (two) times daily.        Acute coronary syndrome (MI, NSTEMI, STEMI, etc) this admission?:  No.  The elevated Troponin was due to the acute medical illness or demand ischemia.    Outstanding Labs/Studies   Echo in 6-8 weeks and coronary CTA for fistula in next 6 weeks for baseline Add entresto when BP allows Recommend cardiac rehab Possible addition of colchicine for chest pain, no NSAIDS   Duration of Discharge Encounter   Greater than 30 minutes including physician time.  Signed, Nada BoozerLaura Baraa Tubbs, NP 11/14/2018, 9:13 AM

## 2018-11-13 NOTE — Progress Notes (Signed)
Progress Note  Patient Name: James [name] Date of Encounter: 11/13/2018  Primary Cardiologist: Sinclair Grooms, MD   Subjective   Still with chest pain at times, he believes related to statin.  no SOB, now does not believe he needs a cath but would like to discus with MD...    Inpatient Medications    Scheduled Meds: . aspirin EC  81 mg Oral Daily  . atorvastatin  80 mg Oral q1800  . enoxaparin (LOVENOX) injection  40 mg Subcutaneous Q24H  . ibuprofen  600 mg Oral TID  . isosorbide mononitrate  30 mg Oral Daily  . metoprolol succinate  12.5 mg Oral Daily  . pantoprazole  40 mg Oral Daily  . sodium chloride flush  3 mL Intravenous Q12H  . spironolactone  12.5 mg Oral Daily  . ticagrelor  90 mg Oral BID   Continuous Infusions: . sodium chloride    . sodium chloride     PRN Meds: sodium chloride, acetaminophen, nitroGLYCERIN, ondansetron (ZOFRAN) IV, sodium chloride flush   Vital Signs    Vitals:   11/12/18 1400 11/12/18 2201 11/13/18 0438 11/13/18 0448  BP: 100/74 106/83 98/71   Pulse: 83 83 86   Resp:      Temp: 98.4 F (36.9 C) 97.6 F (36.4 C) 97.9 F (36.6 C)   TempSrc: Oral Oral Oral   SpO2: 99% 100% 100%   Weight:    57.2 kg  Height:        Intake/Output Summary (Last 24 hours) at 11/13/2018 0709 Last data filed at 11/13/2018 0200 Gross per 24 hour  Intake 363 ml  Output 1000 ml  Net -637 ml   Last 3 Weights 11/13/2018 11/12/2018 11/11/2018  Weight (lbs) 126 lb 1.6 oz 128 lb 129 lb 3.2 oz  Weight (kg) 57.199 kg 58.06 kg 58.605 kg      Telemetry    SR - Personally Reviewed  ECG    No new - Personally Reviewed  Physical Exam   GEN: No acute distress.   Neck: No JVD Cardiac: RRR, no murmurs, rubs, or gallops.  Respiratory: Clear to auscultation bilaterally. GI: Soft, nontender, non-distended  MS: No edema; No deformity. Neuro:  Nonfocal  Psych: Normal affect   Labs    High Sensitivity Troponin:   Recent Labs  Lab 11/11/18  0335 11/11/18 0544  TROPONINIHS 51* 56*      Cardiac EnzymesNo results for input(s): TROPONINI in the last 168 hours. No results for input(s): TROPIPOC in the last 168 hours.   Chemistry Recent Labs  Lab 11/11/18 0335 11/12/18 0333  NA 139 138  K 3.8 3.6  CL 105 105  CO2 21* 23  GLUCOSE 111* 91  BUN 10 10  CREATININE 1.18 1.48*  CALCIUM 9.4 9.2  GFRNONAA >60 60*  GFRAA >60 >60  ANIONGAP 13 10     Hematology Recent Labs  Lab 11/11/18 0335 11/12/18 0333  WBC 8.7 9.0  RBC 5.19 5.09  HGB 14.8 14.4  HCT 43.0 43.0  MCV 82.9 84.5  MCH 28.5 28.3  MCHC 34.4 33.5  RDW 12.2 12.6  PLT 205 213    BNPNo results for input(s): BNP, PROBNP in the last 168 hours.   DDimer No results for input(s): DDIMER in the last 168 hours.   Radiology    No results found.  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  Late presenting acute anterior myocardial infarction with total occlusion of the ostial LAD  Successful stenting using 3.5 x 18 Onyx deployed at 14 atm x 2. During initial deployment the patient took a deep breath while the balloon was being inflated which retracted the balloon into the left main. TIMI grade III flow was noted.  Dominant widely patent circumflex.  Dominant right coronary  Severe left ventricular dysfunction with anteroapical severe hypokinesis/akinesis and EF of 30 to 35%. These findings are consistent with acute systolic heart failure, ischemically mediated  RECOMMENDATIONS:   Guideline directed therapy for acute LV systolic dysfunction/heart failure.  Aggressive secondary risk prevention  Long-term dual antiplatelet therapy due to stent position in the left main  Further coronary artery to pulmonary artery fistula.   Diagnostic Dominance: Left  Intervention        TTE: 10/13/18  IMPRESSIONS    1. The left ventricle has moderate-severely reduced systolic function, with an ejection fraction of 30-35%. The cavity size was normal. Left ventricular diastolic function could not be evaluated due to nondiagnostic images. 2. There is akinesis of the apical septal, mid and apical anteroseptal, mid and apical lateral, entire anterior and apical walls consistent with infarct. Definity contrast suggestive of early mural thrombus formation in the apex in some views. 3. The right ventricle has normal systolic function. The cavity was normal. There is no increase in right ventricular wall thickness. 4. There is mild mitral annular calcification present. 5. The aorta is normal in size and structure. _____________   Patient Profile     38 y.o. male with a history of recent anterior MI with DES x1 to ostial LAD, HLD and ICM with an EF of 30 to 35% with LifeVest placement at discharge who presented to [facility] 11/11/2018 with increasing frequency of left-sided exertional chest pain.   Assessment & Plan    Chest pain - admitted with episodes of chest discomfort.  Burning like sensation in Lt mid sternal area.   HS troponin at 56 but on 10/12/18 was > 27,000.  Now pain resolved. Though he thought was similar to prior chest pain.  Was started on PPI and NSAIDS  Not placed on IV heparin.    CAD with STEMI 10/12/18 and PCI/DES x1 to the ostial LADsecondary to heavy thrombus burden. During stent deployment the patient took in a deep breath while balloon was being inflated and retracted the balloonslightlyinto the left main. 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 On BB but has been difficult to titrate due to BP  ICM with EF 30-35%, with lifeVest no discharge plan for repeat echo in 6-8 weeks.  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

## 2018-11-13 NOTE — Brief Op Note (Signed)
BRIEF CARDIAC CATHETERIZATION NOTE  11/13/2018  3:42 PM  PATIENT:  James [name]  38 y.o. male  PRE-OPERATIVE DIAGNOSIS:  unstable angina  POST-OPERATIVE DIAGNOSIS:  * No post-op diagnosis entered *  PROCEDURE:  Procedure(s): RIGHT/LEFT HEART CATH AND CORONARY ANGIOGRAPHY (N/A)  SURGEON:  Surgeon(s) and Role:    * Hadi Dubin, Harrell Gave, MD - Primary  FINDINGS: 1. Widely patent stent extending from LMCA into LAD.  There is no significant stenosis at the ostium of the jailed, dominant LCx. 2. Moderately to severely reduced left ventricular systolic function with anterior akinesis. 3. Low left and right heart filling pressures. 4. Normal Fick cardiac output/index. 5. No significant intracardiac shunting (Qp:Qs) < 1.2.  RECOMMENDATIONS: 1. Continue aggressive secondary prevention and dual antiplatelet therapy. 2. Will discontinue ibuprofen to minimize risk for bleeding and nephrotoxicity.  [name], MD [facility] Pager: [phone]

## 2018-11-13 NOTE — Interval H&P Note (Signed)
History and Physical Interval Note:  11/13/2018 2:04 PM  James [name]  has presented today for cardiac catheterization, with the diagnosis of unstable angina.  The various methods of treatment have been discussed with the patient and family. After consideration of risks, benefits and other options for treatment, the patient has consented to  Procedure(s): RIGHT/LEFT HEART CATH AND CORONARY ANGIOGRAPHY (N/A) as a surgical intervention.  The patient's history has been reviewed, patient examined, no change in status, stable for surgery.  I have reviewed the patient's chart and labs.  Questions were answered to the patient's satisfaction.    Cath Lab Visit (complete for each Cath Lab visit)  Clinical Evaluation Leading to the Procedure:   ACS: Yes.    Non-ACS:  N/A  James [name]

## 2018-11-13 NOTE — H&P (View-Only) (Signed)
Progress Note  Patient Name: James [name] Date of Encounter: 11/13/2018  Primary Cardiologist: Sinclair Grooms, MD   Subjective   Still with chest pain at times, he believes related to statin.  no SOB, now does not believe he needs a cath but would like to discus with MD...    Inpatient Medications    Scheduled Meds: . aspirin EC  81 mg Oral Daily  . atorvastatin  80 mg Oral q1800  . enoxaparin (LOVENOX) injection  40 mg Subcutaneous Q24H  . ibuprofen  600 mg Oral TID  . isosorbide mononitrate  30 mg Oral Daily  . metoprolol succinate  12.5 mg Oral Daily  . pantoprazole  40 mg Oral Daily  . sodium chloride flush  3 mL Intravenous Q12H  . spironolactone  12.5 mg Oral Daily  . ticagrelor  90 mg Oral BID   Continuous Infusions: . sodium chloride    . sodium chloride     PRN Meds: sodium chloride, acetaminophen, nitroGLYCERIN, ondansetron (ZOFRAN) IV, sodium chloride flush   Vital Signs    Vitals:   11/12/18 1400 11/12/18 2201 11/13/18 0438 11/13/18 0448  BP: 100/74 106/83 98/71   Pulse: 83 83 86   Resp:      Temp: 98.4 F (36.9 C) 97.6 F (36.4 C) 97.9 F (36.6 C)   TempSrc: Oral Oral Oral   SpO2: 99% 100% 100%   Weight:    57.2 kg  Height:        Intake/Output Summary (Last 24 hours) at 11/13/2018 0709 Last data filed at 11/13/2018 0200 Gross per 24 hour  Intake 363 ml  Output 1000 ml  Net -637 ml   Last 3 Weights 11/13/2018 11/12/2018 11/11/2018  Weight (lbs) 126 lb 1.6 oz 128 lb 129 lb 3.2 oz  Weight (kg) 57.199 kg 58.06 kg 58.605 kg      Telemetry    SR - Personally Reviewed  ECG    No new - Personally Reviewed  Physical Exam   GEN: No acute distress.   Neck: No JVD Cardiac: RRR, no murmurs, rubs, or gallops.  Respiratory: Clear to auscultation bilaterally. GI: Soft, nontender, non-distended  MS: No edema; No deformity. Neuro:  Nonfocal  Psych: Normal affect   Labs    High Sensitivity Troponin:   Recent Labs  Lab 11/11/18  0335 11/11/18 0544  TROPONINIHS 51* 56*      Cardiac EnzymesNo results for input(s): TROPONINI in the last 168 hours. No results for input(s): TROPIPOC in the last 168 hours.   Chemistry Recent Labs  Lab 11/11/18 0335 11/12/18 0333  NA 139 138  K 3.8 3.6  CL 105 105  CO2 21* 23  GLUCOSE 111* 91  BUN 10 10  CREATININE 1.18 1.48*  CALCIUM 9.4 9.2  GFRNONAA >60 60*  GFRAA >60 >60  ANIONGAP 13 10     Hematology Recent Labs  Lab 11/11/18 0335 11/12/18 0333  WBC 8.7 9.0  RBC 5.19 5.09  HGB 14.8 14.4  HCT 43.0 43.0  MCV 82.9 84.5  MCH 28.5 28.3  MCHC 34.4 33.5  RDW 12.2 12.6  PLT 205 213    BNPNo results for input(s): BNP, PROBNP in the last 168 hours.   DDimer No results for input(s): DDIMER in the last 168 hours.   Radiology    No results found.  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  Late presenting acute anterior myocardial infarction with total occlusion of the ostial LAD  Successful stenting using 3.5 x 18 Onyx deployed at 14 atm x 2. During initial deployment the patient took a deep breath while the balloon was being inflated which retracted the balloon into the left main. TIMI grade III flow was noted.  Dominant widely patent circumflex.  Dominant right coronary  Severe left ventricular dysfunction with anteroapical severe hypokinesis/akinesis and EF of 30 to 35%. These findings are consistent with acute systolic heart failure, ischemically mediated  RECOMMENDATIONS:   Guideline directed therapy for acute LV systolic dysfunction/heart failure.  Aggressive secondary risk prevention  Long-term dual antiplatelet therapy due to stent position in the left main  Further coronary artery to pulmonary artery fistula.   Diagnostic Dominance: Left  Intervention        TTE: 10/13/18  IMPRESSIONS    1. The left ventricle has moderate-severely reduced systolic function, with an ejection fraction of 30-35%. The cavity size was normal. Left ventricular diastolic function could not be evaluated due to nondiagnostic images. 2. There is akinesis of the apical septal, mid and apical anteroseptal, mid and apical lateral, entire anterior and apical walls consistent with infarct. Definity contrast suggestive of early mural thrombus formation in the apex in some views. 3. The right ventricle has normal systolic function. The cavity was normal. There is no increase in right ventricular wall thickness. 4. There is mild mitral annular calcification present. 5. The aorta is normal in size and structure. _____________   Patient Profile     38 y.o. male with a history of recent anterior MI with DES x1 to ostial LAD, HLD and ICM with an EF of 30 to 35% with LifeVest placement at discharge who presented to [facility] 11/11/2018 with increasing frequency of left-sided exertional chest pain.   Assessment & Plan    Chest pain - admitted with episodes of chest discomfort.  Burning like sensation in Lt mid sternal area.   HS troponin at 56 but on 10/12/18 was > 27,000.  Now pain resolved. Though he thought was similar to prior chest pain.  Was started on PPI and NSAIDS  Not placed on IV heparin.    CAD with STEMI 10/12/18 and PCI/DES x1 to the ostial LADsecondary to heavy thrombus burden. During stent deployment the patient took in a deep breath while balloon was being inflated and retracted the balloonslightlyinto the left main. 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 On BB but has been difficult to titrate due to BP  ICM with EF 30-35%, with lifeVest no discharge plan for repeat echo in 6-8 weeks.  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

## 2018-11-14 ENCOUNTER — Encounter (HOSPITAL_COMMUNITY): Payer: Self-pay | Admitting: Internal Medicine

## 2018-11-14 DIAGNOSIS — I252 Old myocardial infarction: Secondary | ICD-10-CM

## 2018-11-14 DIAGNOSIS — I255 Ischemic cardiomyopathy: Secondary | ICD-10-CM

## 2018-11-14 HISTORY — DX: Ischemic cardiomyopathy: I25.5

## 2018-11-14 LAB — BASIC METABOLIC PANEL
Anion gap: 9 (ref 5–15)
BUN: 8 mg/dL (ref 6–20)
CO2: 21 mmol/L — ABNORMAL LOW (ref 22–32)
Calcium: 8.9 mg/dL (ref 8.9–10.3)
Chloride: 108 mmol/L (ref 98–111)
Creatinine, Ser: 1.22 mg/dL (ref 0.61–1.24)
GFR calc Af Amer: >60 mL/min (ref >60)
GFR calc non Af Amer: >60 mL/min (ref >60)
Glucose, Bld: 81 mg/dL (ref 70–99)
Potassium: 3.6 mmol/L (ref 3.5–5.1)
Sodium: 138 mmol/L (ref 135–145)

## 2018-11-14 LAB — SEDIMENTATION RATE: Sed Rate: 5 mm/hr (ref 0–16)

## 2018-11-14 MED ORDER — METOPROLOL SUCCINATE ER 25 MG PO TB24: 12.5000 mg | ORAL_TABLET | Freq: Every day | ORAL | 6 refills | Status: DC

## 2018-11-14 MED ORDER — PANTOPRAZOLE SODIUM 40 MG PO TBEC: 40.0000 mg | DELAYED_RELEASE_TABLET | Freq: Every day | ORAL | 1 refills | Status: DC

## 2018-11-14 MED ORDER — ACETAMINOPHEN 325 MG PO TABS: 650.0000 mg | ORAL_TABLET | ORAL | Status: DC | PRN

## 2018-11-14 NOTE — Progress Notes (Signed)
TR BAND REMOVAL  LOCATION:    right radial  DEFLATED PER PROTOCOL:    Yes.    TIME BAND OFF / DRESSING APPLIED:    2047   SITE UPON ARRIVAL:    Level 0  SITE AFTER BAND REMOVAL:    Level 0  CIRCULATION SENSATION AND MOVEMENT:    Within Normal Limits   Yes.    COMMENTS:   Post TR band instructions given, sterile dressing applied, good capillary refill.

## 2018-11-14 NOTE — Plan of Care (Signed)
  Problem: Activity: Goal: Ability to return to baseline activity level will improve Outcome: Completed/Met   Problem: Cardiovascular: Goal: Ability to achieve and maintain adequate cardiovascular perfusion will improve Outcome: Completed/Met Goal: Vascular access site(s) Level 0-1 will be maintained Outcome: Completed/Met   

## 2018-11-14 NOTE — Telephone Encounter (Signed)
Thanks and appreciate the timely recommendation.

## 2018-11-14 NOTE — Discharge Instructions (Signed)
Lifestyle Modifications to Prevent and Treat Heart Disease -Recommend heart healthy/Mediterranean diet, with whole grains, fruits, vegetables, fish, lean meats, nuts, olive oil and avocado oil.  -Limit salt intake to less than 1500 mg per day.  -Recommend moderate walking, starting slowly with a few minutes and working up to 3-5 times/week for 30-50 minutes each session. Aim for at least 150 minutes.week. Goal should be pace of 3 miles/hours, or walking 1.5 miles in 30 minutes -Recommend avoidance of tobacco products. Avoid excess alcohol. -Keep blood pressure well controlled, ideally less than 130/80.   ============================================================  Mediterranean Diet A Mediterranean diet refers to food and lifestyle choices that are based on the traditions of countries located on the [employer]. This way of eating has been shown to help prevent certain conditions and improve outcomes for people who have chronic diseases, like kidney disease and heart disease. What are tips for following this plan? Lifestyle  Cook and eat meals together with your family, when possible.  Drink enough fluid to keep your urine clear or pale yellow.  Be physically active every day. This includes: ? Aerobic exercise like running or swimming. ? Leisure activities like gardening, walking, or housework.  Get 7-8 hours of sleep each night.  If recommended by your health care provider, drink red wine in moderation. This means 1 glass a day for nonpregnant women and 2 glasses a day for men. A glass of wine equals 5 oz (150 mL). Reading food labels   Check the serving size of packaged foods. For foods such as rice and pasta, the serving size refers to the amount of cooked product, not dry.  Check the total fat in packaged foods. Avoid foods that have saturated fat or trans fats.  Check the ingredients list for added sugars, such as corn syrup. Shopping  At the grocery store, buy most of  your food from the areas near the walls of the store. This includes: ? Fresh fruits and vegetables (produce). ? Grains, beans, nuts, and seeds. Some of these may be available in unpackaged forms or large amounts (in bulk). ? Fresh seafood. ? Poultry and eggs. ? Low-fat dairy products.  Buy whole ingredients instead of prepackaged foods.  Buy fresh fruits and vegetables in-season from local farmers markets.  Buy frozen fruits and vegetables in resealable bags.  If you do not have access to quality fresh seafood, buy precooked frozen shrimp or canned fish, such as tuna, salmon, or sardines.  Buy small amounts of raw or cooked vegetables, salads, or olives from the deli or salad bar at your store.  Stock your pantry so you always have certain foods on hand, such as olive oil, canned tuna, canned tomatoes, rice, pasta, and beans. Cooking  Cook foods with extra-virgin olive oil instead of using butter or other vegetable oils.  Have meat as a side dish, and have vegetables or grains as your main dish. This means having meat in small portions or adding small amounts of meat to foods like pasta or stew.  Use beans or vegetables instead of meat in common dishes like chili or lasagna.  Experiment with different cooking methods. Try roasting or broiling vegetables instead of steaming or sauteing them.  Add frozen vegetables to soups, stews, pasta, or rice.  Add nuts or seeds for added healthy fat at each meal. You can add these to yogurt, salads, or vegetable dishes.  Marinate fish or vegetables using olive oil, lemon juice, garlic, and fresh herbs. Meal planning  Plan to eat 1 vegetarian meal one day each week. Try to work up to 2 vegetarian meals, if possible.  Eat seafood 2 or more times a week.  Have healthy snacks readily available, such as: ? Vegetable sticks with hummus. ? [place] yogurt. ? Fruit and nut trail mix.  Eat balanced meals throughout the week. This  includes: ? Fruit: 2-3 servings a day ? Vegetables: 4-5 servings a day ? Low-fat dairy: 2 servings a day ? Fish, poultry, or lean meat: 1 serving a day ? Beans and legumes: 2 or more servings a week ? Nuts and seeds: 1-2 servings a day ? Whole grains: 6-8 servings a day ? Extra-virgin olive oil: 3-4 servings a day  Limit red meat and sweets to only a few servings a month What are my food choices?  Mediterranean diet ? Recommended  Grains: Whole-grain pasta. Brown rice. Bulgar wheat. Polenta. Couscous. Whole-wheat bread. Orpah Cobb.  Vegetables: Artichokes. Beets. Broccoli. Cabbage. Carrots. Eggplant. Green beans. Chard. Kale. Spinach. Onions. Leeks. Peas. Squash. Tomatoes. Peppers. Radishes.  Fruits: Apples. Apricots. Avocado. Berries. Bananas. Cherries. Dates. Figs. Grapes. Lemons. Melon. Oranges. Peaches. Plums. Pomegranate.  Meats and other protein foods: Beans. Almonds. Sunflower seeds. Pine nuts. Peanuts. Cod. Salmon. Scallops. Shrimp. Tuna. Tilapia. Clams. Oysters. Eggs.  Dairy: Low-fat milk. Cheese. Greek yogurt.  Beverages: Water. Red wine. Herbal tea.  Fats and oils: Extra virgin olive oil. Avocado oil. Grape seed oil.  Sweets and desserts: [place] yogurt with honey. Baked apples. Poached pears. Trail mix.  Seasoning and other foods: Basil. Cilantro. Coriander. Cumin. Mint. Parsley. Sage. Rosemary. Tarragon. Garlic. Oregano. Thyme. Pepper. Balsalmic vinegar. Tahini. Hummus. Tomato sauce. Olives. Mushrooms. ? Limit these  Grains: Prepackaged pasta or rice dishes. Prepackaged cereal with added sugar.  Vegetables: Deep fried potatoes (french fries).  Fruits: Fruit canned in syrup.  Meats and other protein foods: Beef. Pork. Lamb. Poultry with skin. Hot dogs. Tomasa Blase.  Dairy: Ice cream. Sour cream. Whole milk.  Beverages: Juice. Sugar-sweetened soft drinks. Beer. Liquor and spirits.  Fats and oils: Butter. Canola oil. Vegetable oil. Beef fat (tallow).  Lard.  Sweets and desserts: Cookies. Cakes. Pies. Candy.  Seasoning and other foods: Mayonnaise. Premade sauces and marinades. The items listed may not be a complete list. Talk with your dietitian about what dietary choices are right for you. Summary  The Mediterranean diet includes both food and lifestyle choices.  Eat a variety of fresh fruits and vegetables, beans, nuts, seeds, and whole grains.  Limit the amount of red meat and sweets that you eat.  Talk with your health care provider about whether it is safe for you to drink red wine in moderation. This means 1 glass a day for nonpregnant women and 2 glasses a day for men. A glass of wine equals 5 oz (150 mL). This information is not intended to replace advice given to you by your health care provider. Make sure you discuss any questions you have with your health care provider. Document Released: 10/30/2015 Document Revised: 11/06/2015 Document Reviewed: 10/30/2015 Elsevier Patient Education  2020 [employer].   Call [facility] at [phone] if any bleeding, swelling or drainage at cath site.  May shower, no tub baths for 48 hours for groin sticks. No lifting over 5 pounds for 3 days.  No Driving for 3 days  Wear Lifevest  We added Protonix to help if indigestion caused pain. Holding cholesterol meds until appt to see if this may cause chest pain. Call  if any questions.     Virtual Visit Pre-Appointment Phone Call  "(Name), I am calling you today to discuss your upcoming appointment. We are currently trying to limit exposure to the virus that causes COVID-19 by seeing patients at home rather than in the office."  1. "What is the BEST phone number to call the day of the visit?" - include this in appointment notes  2. Do you have or have access to (through a family member/friend) a smartphone with video capability that we can use for your visit?" a. If yes - list this number in appt notes as cell  (if different from BEST phone #) and list the appointment type as a VIDEO visit in appointment notes b. If no - list the appointment type as a PHONE visit in appointment notes  3. Confirm consent - "In the setting of the current Covid19 crisis, you are scheduled for a (phone or video) visit with your provider on (date) at (time).  Just as we do with many in-office visits, in order for you to participate in this visit, we must obtain consent.  If you'd like, I can send this to your mychart (if signed up) or email for you to review.  Otherwise, I can obtain your verbal consent now.  All virtual visits are billed to your insurance company just like a normal visit would be.  By agreeing to a virtual visit, we'd like you to understand that the technology does not allow for your provider to perform an examination, and thus may limit your provider's ability to fully assess your condition. If your provider identifies any concerns that need to be evaluated in person, we will make arrangements to do so.  Finally, though the technology is pretty good, we cannot assure that it will always work on either your or our end, and in the setting of a video visit, we may have to convert it to a phone-only visit.  In either situation, we cannot ensure that we have a secure connection.  Are you willing to proceed?"   4. Advise patient to be prepared - "Two hours prior to your appointment, go ahead and check your blood pressure, pulse, oxygen saturation, and your weight (if you have the equipment to check those) and write them all down. When your visit starts, your provider will ask you for this information. If you have an Apple Watch or Kardia device, please plan to have heart rate information ready on the day of your appointment. Please have a pen and paper handy nearby the day of the visit as well."  5. Give patient instructions for MyChart download to smartphone OR Doximity/Doxy.me as below if video visit (depending on what  platform provider is using)  6. Inform patient they will receive a phone call 15 minutes prior to their appointment time (may be from unknown caller ID) so they should be prepared to answer    TELEPHONE CALL NOTE  James [name] has been deemed a candidate for a follow-up tele-health visit to limit community exposure during the Covid-19 pandemic. I spoke with the patient via phone to ensure availability of phone/video source, confirm preferred email & phone number, and discuss instructions and expectations.  I reminded James [name] to be prepared with any vital sign and/or heart rhythm information that could potentially be obtained via home monitoring, at the time of his visit. I reminded James [name] to expect a phone call prior to his visit.  Nada BoozerLaura Esco Joslyn, NP 11/14/2018  10:06 AM   INSTRUCTIONS FOR DOWNLOADING THE MYCHART APP TO SMARTPHONE  - The patient must first make sure to have activated MyChart and know their login information - If Apple, go to [employer] Store and type in MyChart in the search bar and download the app. If Android, ask patient to go to [employer] Play Store and type in [place] [facility] in the search bar and download the app. The app is free but as with any other app downloads, their phone may require them to verify saved payment information or Apple/Android password.  - The patient will need to then log into the app with their MyChart username and password, and select [facility] as their healthcare provider to link the account. When it is time for your visit, go to the MyChart app, find appointments, and click Begin Video Visit. Be sure to Select Allow for your device to access the Microphone and Camera for your visit. You will then be connected, and your provider will be with you shortly.  **If they have any issues connecting, or need assistance please contact MyChart service desk [phone])**  **If using a computer, in order to ensure the best  quality for their visit they will need to use either of the following Internet Browsers: [employer] Edge, or Google Chrome**  IF USING DOXIMITY or DOXY.ME - The patient will receive a link just prior to their visit by text.     FULL LENGTH CONSENT FOR TELE-HEALTH VISIT   I hereby voluntarily request, consent and authorize [facility] and its employed or contracted physicians, physician assistants, nurse practitioners or other licensed health care professionals (the Practitioner), to provide me with telemedicine health care services (the Services") as deemed necessary by the treating Practitioner. I acknowledge and consent to receive the Services by the Practitioner via telemedicine. I understand that the telemedicine visit will involve communicating with the Practitioner through live audiovisual communication technology and the disclosure of certain medical information by electronic transmission. I acknowledge that I have been given the opportunity to request an in-person assessment or other available alternative prior to the telemedicine visit and am voluntarily participating in the telemedicine visit.  I understand that I have the right to withhold or withdraw my consent to the use of telemedicine in the course of my care at any time, without affecting my right to future care or treatment, and that the Practitioner or I may terminate the telemedicine visit at any time. I understand that I have the right to inspect all information obtained and/or recorded in the course of the telemedicine visit and may receive copies of available information for a reasonable fee.  I understand that some of the potential risks of receiving the Services via telemedicine include:   Delay or interruption in medical evaluation due to technological equipment failure or disruption;  Information transmitted may not be sufficient (e.g. poor resolution of images) to allow for appropriate medical decision making by the  Practitioner; and/or   In rare instances, security protocols could fail, causing a breach of personal health information.  Furthermore, I acknowledge that it is my responsibility to provide information about my medical history, conditions and care that is complete and accurate to the best of my ability. I acknowledge that Practitioner's advice, recommendations, and/or decision may be based on factors not within their control, such as incomplete or inaccurate data provided by me or distortions of diagnostic images or specimens that may result from electronic transmissions. I understand that the practice of medicine is  not an Visual merchandiser and that Practitioner makes no warranties or guarantees regarding treatment outcomes. I acknowledge that I will receive a copy of this consent concurrently upon execution via email to the email address I last provided but may also request a printed copy by calling the office of [facility].    I understand that my insurance will be billed for this visit.   I have read or had this consent read to me.  I understand the contents of this consent, which adequately explains the benefits and risks of the Services being provided via telemedicine.   I have been provided ample opportunity to ask questions regarding this consent and the Services and have had my questions answered to my satisfaction.  I give my informed consent for the services to be provided through the use of telemedicine in my medical care  By participating in this telemedicine visit I agree to the above.

## 2018-11-16 ENCOUNTER — Ambulatory Visit: Payer: BC Managed Care – PPO | Admitting: Cardiology

## 2018-11-19 ENCOUNTER — Encounter: Payer: Self-pay | Admitting: Physician Assistant

## 2018-11-19 NOTE — Progress Notes (Signed)
Virtual Visit via Video Note   This visit type was conducted due to national recommendations for restrictions regarding the COVID-19 Pandemic (e.g. social distancing) in an effort to limit this patient's exposure and mitigate transmission in our community.  Due to his co-morbid illnesses, this patient is at least at moderate risk for complications without adequate follow up.  This format is felt to be most appropriate for this patient at this time.  All issues noted in this document were discussed and addressed.  A limited physical exam was performed with this format.  Please refer to the patient's chart for his consent to telehealth for [facility].   Date:  11/20/2018   ID:  James [name], DOB [birth year], MRN [id number]  Patient Location: Home Provider Location: Home  PCP:  Patient, No Pcp Per  Cardiologist:  Sinclair Grooms, MD   Electrophysiologist:  None   Evaluation Performed:  Follow-Up Visit  Chief Complaint:  Post hospital; admx with chest pain; s/p cardiac cath  History of Present Illness:    James [name] is a 38 y.o. 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  He underwent PCI to the LAD with a DES.  His EF is 30-35 and he was placed on a LifeVest.  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  He was last seen by Kathyrn Drown, NP 10/31/2018.  BP limited titration of CHF meds.  He was admitted 8/22-8/25 with chest pain and minimally elevated Troponin levels.  He underwent cardiac catheterization which demonstrated a patent LAD stent and no other significant obstruction.   He was taken off of his Atorvastatin with a plan to start low dose Rosuvastatin at  follow up if his symptoms had resolved.  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    Today, he notes that his chest discomfort is much improved.  However, he still has some exertional discomfort in his left chest.  He notes that this is not like his angina with his myocardial infarction.  He can sometimes walk further without symptoms.  He has not had pleuritic chest pain or chest pain with lying supine.  He has not had associated shortness of breath or radiating symptoms.  He has not taken nitroglycerin.  He notes that he has taken nitroglycerin for this pain in the past without relief.  His beta-blocker dose was cut in half during his most recent hospitalization.  I am not exactly sure why.  However, he notes no change in his symptoms with reducing his dose.  He has not had orthopnea, PND, leg swelling.  He has not had syncope.  He is still wearing his LifeVest.  The patient does not have symptoms concerning for COVID-19 infection (fever, chills, cough, or new shortness of breath).    Past Medical History:  Diagnosis Date  . Acute combined systolic and diastolic heart failure ([place]) 2018-10-29  . Acute ST elevation myocardial infarction (STEMI) ([place]) 10/12/2018  . At risk for sudden cardiac death 10/29/18  . Cardiomyopathy, ischemic 11/14/2018  .  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 Coronary artery disease involving native coronary artery of native heart with unstable angina pectoris ([place]) 10/13/2018  . Delayed presentation of acute anterolateral STEMI 10/12/2018  . Hyperlipidemia    possible elevated triglycerides  . Ischemic cardiomyopathy    EF 30-35%  . Presence of drug coated stent in LAD coronary artery 10/13/2018   Ostial LAD DES PCI: Resolute Onyx 3.5 mm x 18 mm - 3.6 mm  . STEMI (ST elevation myocardial  infarction) ([place]) 10/12/2018   Past Surgical History:  Procedure Laterality Date  . CORONARY/GRAFT ACUTE MI REVASCULARIZATION N/A 10/12/2018   Procedure: CORONARY/GRAFT ACUTE MI REVASCULARIZATION;  Surgeon: [name], MD;  Location: [place] [facility];  Service: Cardiovascular;  Laterality: N/A;  . LEFT HEART CATH AND CORONARY ANGIOGRAPHY N/A 10/12/2018   Procedure: LEFT HEART CATH AND CORONARY ANGIOGRAPHY;  Surgeon: [name], MD;  Location: [place] [facility];  Service: Cardiovascular;  Laterality: N/A;  . RIGHT/LEFT HEART CATH AND CORONARY ANGIOGRAPHY N/A 11/13/2018   Procedure: RIGHT/LEFT HEART CATH AND CORONARY ANGIOGRAPHY;  Surgeon: [name], MD;  Location: [place] [facility];  Service: Cardiovascular;  Laterality: N/A;     Current Meds  Medication Sig  . acetaminophen (TYLENOL) 325 MG tablet Take 2 tablets (650 mg total) by mouth every 4 (four) hours as needed for headache or mild pain.  Marland Kitchen aspirin 81 MG chewable tablet Chew 1 tablet (81 mg total) by mouth daily.  . isosorbide mononitrate (IMDUR) 30 MG 24 hr tablet Take 1 tablet (30 mg total) by mouth daily.  . metoprolol succinate (TOPROL-XL) 25 MG 24 hr tablet Take 0.5 tablets (12.5 mg total) by mouth daily.  . nitroGLYCERIN (NITROSTAT) 0.4 MG SL tablet Place 1 tablet (0.4 mg total) under the tongue every 5 (five) minutes x 3 doses as needed for chest pain.  . pantoprazole (PROTONIX) 40 MG tablet Take 1 tablet (40 mg total) by mouth daily.  Marland Kitchen spironolactone (ALDACTONE) 25 MG tablet Take 0.5 tablets (12.5 mg total) by mouth daily.  . ticagrelor (BRILINTA) 90 MG TABS tablet Take 1 tablet (90 mg total) by mouth 2 (two) times daily.     Allergies:   Patient has no known allergies.   Social History   Tobacco Use  . Smoking status: Never Smoker  . Smokeless tobacco: Never Used  Substance Use Topics  . Alcohol use: Yes    Alcohol/week: 2.0 standard drinks    Types: 2 Cans of beer per week    Comment: 2 packs/month    . Drug use: Never     Family Hx: The patient's family history is negative for Heart disease.  ROS:   Please see the history of present illness.    All other systems reviewed and are negative.   Prior CV studies:   The following studies were reviewed today:  Cardiac catheterization 11/13/2018 LM normal  LAD ost stent patent, mid 69, dist 14 LCx jailed (patent) EF 30-35 RA (mean): 1 mmHg RV (S/EDP): 26/1 mmHg PA (S/D, mean): 25/7 (13) mmHg PCWP (mean): 7 mmHg  Ao sat: 97% SVC sat: 78% IVC sat: 85% RA sat: 76% RV sat: 76% RPA sat: 75% LPA sat: 79%  Fick CO: 5.3 L/min Fick CI: 3.2 L/min/m^2  Qp:Qs: 1.17 (when using LPA sat for PA sat and RV sat for mixed venous sat)    Echocardiogram 10/13/2018 EF 30-35, apical, ant-sept, lat, ant AK (c/w infarct), Definity contrast suggestive of  early mural thrombus formation in apex on some views, normal RVSF, minimal MAC  Cardiac catheterization 10/12/2018  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  Late presenting acute anterior myocardial infarction with total occlusion of the ostial LAD  Successful stenting using 3.5 x 18 Onyx deployed at 14 atm x 2.  During initial deployment the patient took a deep breath while the balloon was being inflated which retracted the balloon into the left main.  TIMI grade III flow was noted.  Dominant widely patent circumflex.  Dominant right coronary  Severe left ventricular dysfunction with anteroapical severe hypokinesis/akinesis and EF of 30 to 35%.  These findings are consistent with acute systolic heart failure, ischemically mediated    Labs/Other Tests and Data Reviewed:    EKG:  No ECG reviewed.  Recent Labs: 10/13/2018: ALT 102; B Natriuretic Peptide 325.1; TSH 3.547 10/14/2018: Magnesium 2.1 11/12/2018: Platelets 213 11/13/2018: Hemoglobin 11.9 11/14/2018: BUN 8; Creatinine, Ser 1.22; Potassium 3.6;  Sodium 138   Recent Lipid Panel Lab Results  Component Value Date/Time   CHOL 196 10/13/2018 03:17 AM   TRIG 174 (H) 10/13/2018 03:17 AM   HDL 43 10/13/2018 03:17 AM   CHOLHDL 4.6 10/13/2018 03:17 AM   LDLCALC 118 (H) 10/13/2018 03:17 AM    Wt Readings from Last 3 Encounters:  11/20/18 130 lb (59 kg)  11/14/18 126 lb 14.4 oz (57.6 kg)  10/31/18 138 lb (62.6 kg)     Objective:    Vital Signs:  BP 115/80   Pulse 85   Ht _0  (1.651 m)   Wt 130 lb (59 kg)   BMI 21.63 kg/m    VITAL SIGNS:  reviewed GEN:  no acute distress EYES:  sclerae anicteric, EOMI - Extraocular Movements Intact RESPIRATORY:  Normal respiratory effort NEURO:  alert and oriented x 3, no obvious focal deficit PSYCH:  normal affect  ASSESSMENT & PLAN:    1. Coronary artery disease involving native coronary artery of native heart with angina pectoris [facility]) History of delayed presentation anterior myocardial infarction in July 2020 treated with a drug-eluting stent to the LAD.  He was recently admitted last week with chest discomfort.  Cardiac catheterization demonstrated a patent LAD stent.  LCx is jailed.  He noted significant discomfort in his chest with taking atorvastatin.  This was discontinued.  Since that time, he notes significantly improved symptoms.  However, he continues to note exertional symptoms.  He does not really seem to have any symptoms consistent with pericarditis.  He does not have any other associated symptoms with his chest discomfort.  He has not had relief with nitroglycerin in the past.  He was given a prescription for PPI in the hospital but has not started this yet.  His blood pressure has limited titration of his medication somewhat in the past.  At this point, I recommend continuing his current medications.  Question if the jailed LCx can be causing any of his angina.  We could consider increasing his isosorbide or metoprolol if his chest discomfort continues.  Plan follow-up in the  next 3 to 4 weeks.  2. 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  Recommendation was been to proceed with coronary CTA to better characterize.  I will make sure that this is arranged.  -Schedule Coronary CTA  3. Chronic systolic CHF (congestive heart failure) (HCC) Overall, volume seems to be stable.  EF is  30-35 by echocardiogram 10/13/2018.  Of note, there was a question of changes that may indicate early mural thrombus on his echo 10/13/2018.  I think it would be best if we proceeded with a follow-up, limited echo to reassess his LV function and also reassess for mural thrombus.  He has not been placed on ACE inhibitor or ARB secondary to low blood pressure.  -Arrange for limited 2D echo in the next 1-2 weeks  -Continue current dose of beta-blocker, nitrates, spironolactone  -Try to add ACE/ARB as blood pressure will allow  4. Ischemic cardiomyopathy Continue LifeVest for now.  If his EF is >35% on his upcoming echocardiogram, his LifeVest can be discontinued.  Otherwise, he will need a formal repeat echo 3 months post MI to decide on whether or not he will need ICD implantation.  5. Hyperlipidemia, unspecified hyperlipidemia type LFTs were significantly elevated in July.  His chest symptoms are much better off of atorvastatin.  I will arrange a follow-up CMET.  I will also have him start rosuvastatin 10 mg in 2 weeks.  If his chest symptoms should worsen with Rosuvastatin, he should let us know.  At that point, I will refer him to the [facility] for PCSK9 inhibitor Rx.    Time:   Today, I have spent 27 minutes with the patient with telehealth technology discussing the above problems.     Medication Adjustments/Labs and Tests Ordered: Current medicines are reviewed at length with the patient today.  Concerns regarding medicines are outlined above.   Tests Ordered: Orders Placed This Encounter  Procedures  . CT CORONARY MORPH  W/CTA COR W/SCORE W/CA W/CM &/OR WO/CM  . CT CORONARY FRACTIONAL FLOW RESERVE FLUID ANALYSIS  . CT CORONARY FRACTIONAL FLOW RESERVE DATA PREP  . Comp Met (CMET)  . Lipid panel  . Hepatic function panel  . ECHOCARDIOGRAM COMPLETE    Medication Changes: Meds ordered this encounter  Medications  . rosuvastatin (CRESTOR) 10 MG tablet    Sig: Take 1 tablet (10 mg total) by mouth daily. Do not start taking until 12/04/2018    Dispense:  90 tablet    Refill:  3  . metoprolol tartrate (LOPRESSOR) 100 MG tablet    Sig: Take 1 tablet (100 mg total) by mouth as directed. Take 2 hours prior to your CT    Dispense:  1 tablet    Refill:  0    Follow Up:  In Person in 3-4 week(s)  Signed, Richardson Dopp, PA-C  11/20/2018 5:11 PM    [facility]

## 2018-11-20 ENCOUNTER — Telehealth (INDEPENDENT_AMBULATORY_CARE_PROVIDER_SITE_OTHER): Payer: BC Managed Care – PPO | Admitting: Physician Assistant

## 2018-11-20 ENCOUNTER — Encounter: Payer: Self-pay | Admitting: Physician Assistant

## 2018-11-20 ENCOUNTER — Other Ambulatory Visit: Payer: Self-pay

## 2018-11-20 VITALS — BP 115/80 | HR 85 | Ht 65.0 in | Wt 130.0 lb

## 2018-11-20 DIAGNOSIS — I25119 Atherosclerotic heart disease of native coronary artery with unspecified angina pectoris: Secondary | ICD-10-CM

## 2018-11-20 DIAGNOSIS — E785 Hyperlipidemia, unspecified: Secondary | ICD-10-CM

## 2018-11-20 DIAGNOSIS — I255 Ischemic cardiomyopathy: Secondary | ICD-10-CM

## 2018-11-20 DIAGNOSIS — I5022 Chronic systolic (congestive) heart failure: Secondary | ICD-10-CM

## 2018-11-20 DIAGNOSIS — Q245 Malformation of coronary vessels: Secondary | ICD-10-CM

## 2018-11-20 MED ORDER — ROSUVASTATIN CALCIUM 10 MG PO TABS: 10.0000 mg | ORAL_TABLET | Freq: Every day | ORAL | 3 refills | Status: DC

## 2018-11-20 MED ORDER — METOPROLOL TARTRATE 100 MG PO TABS: 100.0000 mg | ORAL_TABLET | ORAL | 0 refills | Status: DC

## 2018-11-20 NOTE — Patient Instructions (Addendum)
Medication Instructions:  START: Rosuvastatin (Crestor)10 mg once a day (Do not start taking until 12/04/2018)  If you need a refill on your cardiac medications before your next appointment, please call your pharmacy.   Lab work:  FUTURE: CMET on 12/01/2018 (Lab is open from 7:30 AM to 4:30 PM)  FUTURE: Your physician recommends that you return for a FASTING lipid profile and hepatic function test on 02/20/2019  If you have labs (blood work) drawn today and your tests are completely normal, you will receive your results only by: Marland Kitchen. MyChart Message (if you have MyChart) OR . A paper copy in the mail If you have any lab test that is abnormal or we need to change your treatment, we will call you to review the results.  Testing/Procedures: Your physician has ordered for you to have a Coronary CTA (Instructions below)  Your physician has requested that you have an echocardiogram. Echocardiography is a painless test that uses sound waves to create images of your heart. It provides your doctor with information about the size and shape of your heart and how well your heart's chambers and valves are working. This procedure takes approximately one hour. There are no restrictions for this procedure.      Follow-Up: You are scheduled to see James BoozerLaura Ingold, NP on 12/18/2018 @ 11:30 AM   Any Other Special Instructions Will Be Listed Below (If Applicable).  PLEASE CALL OUR OFFICE IN 1-2 WEEKS IF YOUR CHEST PAIN CONTINUES OR GETS WORSE   Your cardiac CT will be scheduled at one of the below locations:   [facility] [address] [place], [place] [address] [phone]  Please [facility] the [facility] main entrance of [facility] 30-45 minutes prior to test start time. Proceed to the [facility] (first floor) to check-in and test prep.  Please follow these instructions carefully (unless otherwise directed):  Hold all erectile dysfunction medications at  least 48 hours prior to test.  On the Night Before the Test: . Be sure to Drink plenty of water. . Do not consume any caffeinated/decaffeinated beverages or chocolate 12 hours prior to your test. . Do not take any antihistamines 12 hours prior to your test.  On the Day of the Test: . Drink plenty of water. Do not drink any water within one hour of the test. . Do not eat any food 4 hours prior to the test. . You may take your regular medications prior to the test.  . Take metoprolol (Lopressor) two hours prior to test. . Hold your Spironolactone the morning of your test  . Hold your Imdur the morning of your test          After the Test: . Drink plenty of water. . After receiving IV contrast, you may experience a mild flushed feeling. This is normal. . On occasion, you may experience a mild rash up to 24 hours after the test. This is not dangerous. If this occurs, you can take Benadryl 25 mg and increase your fluid intake. . If you experience trouble breathing, this can be serious. If it is severe call 911 IMMEDIATELY. If it is mild, please call our office. . If you take any of these medications: Glipizide/Metformin, Avandament, Glucavance, please do not take 48 hours after completing test.    Please contact the cardiac imaging nurse navigator should you have any questions/concerns James AlexandriaSara Wallace, RN Navigator Cardiac Imaging [facility] [phone] Office  [phone]  Cell

## 2018-11-24 ENCOUNTER — Ambulatory Visit (HOSPITAL_COMMUNITY): Payer: BC Managed Care – PPO | Attending: Physician Assistant

## 2018-11-24 ENCOUNTER — Other Ambulatory Visit: Payer: Self-pay

## 2018-11-24 DIAGNOSIS — I255 Ischemic cardiomyopathy: Secondary | ICD-10-CM

## 2018-11-24 DIAGNOSIS — I5022 Chronic systolic (congestive) heart failure: Secondary | ICD-10-CM | POA: Insufficient documentation

## 2018-11-24 MED ORDER — PERFLUTREN LIPID MICROSPHERE
1.0000 mL | INTRAVENOUS | Status: AC | PRN
  Administered 2018-11-24: 2 mL via INTRAVENOUS

## 2018-11-28 ENCOUNTER — Encounter: Payer: Self-pay | Admitting: Physician Assistant

## 2018-12-01 ENCOUNTER — Other Ambulatory Visit: Payer: Self-pay

## 2018-12-01 ENCOUNTER — Other Ambulatory Visit: Payer: BC Managed Care – PPO | Admitting: *Deleted

## 2018-12-01 DIAGNOSIS — E785 Hyperlipidemia, unspecified: Secondary | ICD-10-CM | POA: Diagnosis not present

## 2018-12-01 LAB — COMPREHENSIVE METABOLIC PANEL
ALT: 14 IU/L (ref 0–44)
AST: 17 IU/L (ref 0–40)
Albumin/Globulin Ratio: 2.2 (ref 1.2–2.2)
Albumin: 4.7 g/dL (ref 4.0–5.0)
Alkaline Phosphatase: 62 IU/L (ref 39–117)
BUN/Creatinine Ratio: 10 (ref 9–20)
BUN: 11 mg/dL (ref 6–20)
Bilirubin Total: 0.8 mg/dL (ref 0.0–1.2)
CO2: 23 mmol/L (ref 20–29)
Calcium: 9.8 mg/dL (ref 8.7–10.2)
Chloride: 105 mmol/L (ref 96–106)
Creatinine, Ser: 1.08 mg/dL (ref 0.76–1.27)
GFR calc Af Amer: 101 mL/min/{1.73_m2} (ref >59)
GFR calc non Af Amer: 87 mL/min/{1.73_m2} (ref >59)
Globulin, Total: 2.1 g/dL (ref 1.5–4.5)
Glucose: 90 mg/dL (ref 65–99)
Potassium: 4.2 mmol/L (ref 3.5–5.2)
Sodium: 140 mmol/L (ref 134–144)
Total Protein: 6.8 g/dL (ref 6.0–8.5)

## 2018-12-01 LAB — LIPID PANEL
Chol/HDL Ratio: 5.4 ratio — ABNORMAL HIGH (ref 0.0–5.0)
Cholesterol, Total: 157 mg/dL (ref 100–199)
HDL: 29 mg/dL — ABNORMAL LOW (ref >39)
LDL Chol Calc (NIH): 92 mg/dL (ref 0–99)
Triglycerides: 212 mg/dL — ABNORMAL HIGH (ref 0–149)
VLDL Cholesterol Cal: 36 mg/dL (ref 5–40)

## 2018-12-01 LAB — HEPATIC FUNCTION PANEL: Bilirubin, Direct: 0.19 mg/dL (ref 0.00–0.40)

## 2018-12-15 DIAGNOSIS — I2101 ST elevation (STEMI) myocardial infarction involving left main coronary artery: Secondary | ICD-10-CM | POA: Diagnosis not present

## 2018-12-17 NOTE — Progress Notes (Signed)
   Cardiology Office Note   Date:  12/18/2018   ID:  James [name], DOB 09/14/1980, MRN [id number]  PCP:  Patient, No Pcp Per  Cardiologist:  Dr. Smith    Chief Complaint  Patient presents with  . Coronary Artery Disease  . Cardiomyopathy      History of Present Illness: James [name] is a 38 y.o. male who presents for   Coronary artery disease ? 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 Ischemic CM, EF 30-35 ? LifeVest  Hyperlipidemia   [name] was admitted in July with a late presentation anterior STEMI.  He underwent PCI to the LAD with a DES.  His EF is 30-35 and he was placed on a LifeVest.  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  He was last seen by [name], NP 10/31/2018.  BP limited titration of CHF meds.  He was admitted 8/22-8/25 with chest pain and minimally elevated Troponin levels.  He underwent cardiac catheterization which demonstrated a patent LAD stent and no other significant obstruction.   He was taken off of his Atorvastatin with a plan to start low dose Rosuvastatin at follow up if his symptoms had resolved.  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    On last telehealth visit he notes that his chest discomfort is much improved.  However, he still has some exertional discomfort in his left chest.  He notes that this is not like his angina with his myocardial infarction.  He can sometimes walk further without symptoms.  He has not had pleuritic chest pain or chest pain with lying supine.  He has not had associated shortness of breath or radiating symptoms.  He has not taken nitroglycerin.  He notes that he has taken nitroglycerin for  this pain in the past without relief.  His beta-blocker dose was cut in half during his most recent hospitalization.  I am not exactly sure why.  However, he notes no change in his symptoms with reducing his dose.  He has not had orthopnea, PND, leg swelling.  He has not had syncope.  He is still wearing his LifeVest.  Today he still has some occ chest discomfort but may be muscular skeletal.  He has had repeat echo - no thrombus but EF still 30-35%.  BP is on lower end but no dizziness or lightheadedness.  Has not missed any meds.  Overall doing well.   Past Medical History:  Diagnosis Date  . Acute combined systolic and diastolic heart failure (HCC) 10/13/2018  . Acute ST elevation myocardial infarction (STEMI) (HCC) 10/12/2018  . At risk for sudden cardiac death 10/13/2018  . Cardiomyopathy, ischemic 11/14/2018  . 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 Coronary artery disease involving native coronary artery of native heart with unstable angina pectoris (HCC) 10/13/2018  . Delayed presentation of acute anterolateral STEMI 10/12/2018  . Hyperlipidemia    possible elevated triglycerides  . Ischemic cardiomyopathy    EF 30-35% // Echo 11/2018: Anteroseptal and anterior hypokinesis, EF 30-35, small circumferential pericardial effusion, no intracardiac thrombi   . Presence of drug coated stent in LAD coronary artery 10/13/2018   Ostial LAD   DES PCI: Resolute Onyx 3.5 mm x 18 mm - 3.6 mm  . STEMI (ST elevation myocardial infarction) (HCC) 10/12/2018    Past Surgical History:  Procedure Laterality Date  . CORONARY/GRAFT ACUTE MI REVASCULARIZATION N/A 10/12/2018   Procedure: CORONARY/GRAFT ACUTE MI REVASCULARIZATION;  Surgeon: Smith, Henry W, MD;  Location: [facility];  Service: Cardiovascular;  Laterality: N/A;  . LEFT HEART CATH AND CORONARY ANGIOGRAPHY N/A 10/12/2018   Procedure: LEFT HEART CATH AND CORONARY ANGIOGRAPHY;  Surgeon: Smith, Henry W, MD;  Location: [facility];  Service: Cardiovascular;  Laterality: N/A;  . RIGHT/LEFT HEART CATH AND CORONARY ANGIOGRAPHY N/A 11/13/2018   Procedure: RIGHT/LEFT HEART CATH AND CORONARY ANGIOGRAPHY;  Surgeon: End, Christopher, MD;  Location: [facility];  Service: Cardiovascular;  Laterality: N/A;     Current Outpatient Medications  Medication Sig Dispense Refill  . aspirin 81 MG chewable tablet Chew 1 tablet (81 mg total) by mouth daily. 90 tablet 1  . isosorbide mononitrate (IMDUR) 30 MG 24 hr tablet Take 1 tablet (30 mg total) by mouth daily. 30 tablet 1  . metoprolol succinate (TOPROL-XL) 25 MG 24 hr tablet Take 0.5 tablets (12.5 mg total) by mouth daily. 15 tablet 6  . nitroGLYCERIN (NITROSTAT) 0.4 MG SL tablet Place 1 tablet (0.4 mg total) under the tongue every 5 (five) minutes x 3 doses as needed for chest pain. 25 tablet 2  . pantoprazole (PROTONIX) 40 MG tablet Take 1 tablet (40 mg total) by mouth daily. 30 tablet 1  . rosuvastatin (CRESTOR) 10 MG tablet Take 1 tablet (10 mg total) by mouth daily. Do not start taking until 12/04/2018 90 tablet 3  . spironolactone (ALDACTONE) 25 MG tablet Take 0.5 tablets (12.5 mg total) by mouth daily. 30 tablet 1  . ticagrelor (BRILINTA) 90 MG TABS tablet Take 1 tablet (90 mg total) by mouth 2 (two) times daily. 180 tablet 2   No current facility-administered medications for this visit.     Allergies:   Patient has no known allergies.    Social History:  The patient  reports that he has never smoked. He has never used smokeless tobacco. He reports current alcohol use of about 2.0 standard drinks of alcohol per week. He reports that he does not use drugs.   Family History:  The patient's family without CAD.  both parents are alive.    ROS:  General:no colds or fevers, no weight changes Skin:no rashes or ulcers HEENT:no blurred vision, no congestion CV:see HPI PUL:see HPI GI:no diarrhea constipation or melena, no indigestion GU:no hematuria, no dysuria MS:no  joint pain, no claudication Neuro:no syncope, no lightheadedness Endo:no diabetes, no thyroid disease  Wt Readings from Last 3 Encounters:  12/18/18 128 lb 12.8 oz (58.4 kg)  11/20/18 130 lb (59 kg)  11/14/18 126 lb 14.4 oz (57.6 kg)     PHYSICAL EXAM: VS:  BP 108/60   Pulse 85   Ht 5' 5" (1.651 m)   Wt 128 lb 12.8 oz (58.4 kg)   SpO2 99%   BMI 21.43 kg/m  , BMI Body mass index is 21.43 kg/m. General:Pleasant affect, NAD Skin:Warm and dry, brisk capillary refill HEENT:normocephalic, sclera clear, mucus membranes moist Neck:supple, no JVD, no bruits  Heart:S1S2 RRR without murmur, gallup, rub or click Lungs:clear without rales, rhonchi, or wheezes Abd:soft, non tender, + BS, do not palpate liver spleen or masses Ext:no lower ext edema, 2+ pedal pulses, 2+ radial pulses Neuro:alert and oriented, MAE, follows   commands, + facial symmetry    EKG:  EKG is NOT ordered today.   Recent Labs: 10/13/2018: B Natriuretic Peptide 325.1; TSH 3.547 10/14/2018: Magnesium 2.1 11/12/2018: Platelets 213 11/13/2018: Hemoglobin 11.9 12/01/2018: ALT 14; BUN 11; Creatinine, Ser 1.08; Potassium 4.2; Sodium 140    Lipid Panel    Component Value Date/Time   CHOL 157 12/01/2018 0836   TRIG 212 (H) 12/01/2018 0836   HDL 29 (L) 12/01/2018 0836   CHOLHDL 5.4 (H) 12/01/2018 0836   CHOLHDL 4.6 10/13/2018 0317   VLDL 35 10/13/2018 0317   LDLCALC 118 (H) 10/13/2018 0317       Other studies Reviewed: Additional studies/ records that were reviewed today include:   TTE 11/24/18 .IMPRESSIONS    1. Mild dyskinesis of the left ventricular, entire apical segment.  2. Severe hypokinesis of the left ventricular, entire anteroseptal wall and anterior wall.  3. The left ventricle has moderate-severely reduced systolic function, with an ejection fraction of 30-35%. The cavity size was normal. reduced annular diastolic velocities for age.  4. Small circumferential pericardial effusion.  5. The aorta is  normal unless otherwise noted.  6. No intracardiac thrombi or masses were visualized.  FINDINGS  Left Ventricle: The left ventricle has moderate-severely reduced systolic function, with an ejection fraction of 30-35%. The cavity size was normal. There is no increase in left ventricular wall thickness. reduced annular diastolic velocities for age..  Normal left ventricular filling pressures Severe hypokinesis of the left ventricular, entire anteroseptal wall and anterior wall. Mild dyskinesis of the left ventricular, entire apical segment. Definity contrast agent was given IV to delineate the left  ventricular endocardial borders.  Right Ventricle: The right ventricle has normal systolic function. The cavity was normal. There is right vetricular wall thickness was not assessed. Right ventricular systolic pressure could not be assessed.  Left Atrium: Left atrial size was normal in size.  Right Atrium: Right atrial size was normal in size. Right atrial pressure is estimated at 10 mmHg.  Interatrial Septum: No atrial level shunt detected by color flow Doppler.  Pericardium: A small pericardial effusion is present. The pericardial effusion is circumferential.  Mitral Valve: The mitral valve is normal in structure. Mitral valve regurgitation is trivial by color flow Doppler.  Tricuspid Valve: The tricuspid valve is normal in structure. Tricuspid valve regurgitation was not visualized by color flow Doppler.  Aortic Valve: The aortic valve is normal in structure. Aortic valve regurgitation was not visualized by color flow Doppler.  Pulmonic Valve: The pulmonic valve was normal in structure. Pulmonic valve regurgitation is not visualized by color flow Doppler.  Aorta: The aorta is normal unless otherwise noted.  Cardiac catheterization 11/13/2018 LM normal  LAD ost stent patent, mid 35, dist 50 LCx jailed (patent) EF 30-35 RA (mean): 1 mmHg RV (S/EDP): 26/1 mmHg PA (S/D, mean):  25/7 (13) mmHg PCWP (mean): 7 mmHg  Ao sat: 97% SVC sat: 78% IVC sat: 85% RA sat: 76% RV sat: 76% RPA sat: 75% LPA sat: 79%  Fick CO: 5.3 L/min Fick CI: 3.2 L/min/m^2  Qp:Qs: 1.17 (when using LPA sat for PA sat and RV sat for mixed venous sat)    Echocardiogram 10/13/2018 EF 30-35, apical, ant-sept, lat, ant AK (c/w infarct), Definity contrast suggestive of early mural thrombus formation in apex on some views, normal RVSF, minimal MAC  Cardiac catheterization 10/12/2018  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  Late presenting acute anterior   myocardial infarction with total occlusion of the ostial LAD  Successful stenting using 3.5 x 18 Onyx deployed at 14 atm x 2. During initial deployment the patient took a deep breath while the balloon was being inflated which retracted the balloon into the left main. TIMI grade III flow was noted.  Dominant widely patent circumflex.  Dominant right coronary  Severe left ventricular dysfunction with anteroapical severe hypokinesis/akinesis and EF of 30 to 35%. These findings are consistent with acute systolic heart failure, ischemically mediated   ASSESSMENT AND PLAN:  1.  Ischemic cardiomyopathy - EF remains 30-35% continue lifevest.  His MI was late presenting.  Discussed with Dr. Smith we discussed h and will stop imdur and aldactone and use entresto low dose.  Pt aware to call if increased angina. Doubt we can increase dose with his BP will recheck EF the end of October by MRI,    We discussed ICD placement but hope with entresto the EF will improve. Continue BB.  He will follow up with Dr. Smith post MRI  2. 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  Will cancel cardiac CTA and do cardiac MRI in end of oct for both.  Discussed with Dr. Smith.  3.  CAD with MI with late  presentation and DES to ostial LAD 10/12/18 and recheck cath in August was patent.  Continue Brilinta and ASA. -he does have some pain that comes and goes and may be muscular. Has not changed with imdur or BB changes. Continue to exercise with walking and to go to cardiac rehab.   4.  HLD was off lipitor for 2 weeks now on Crestor will need lipid check end of Oct to re-eval.    5.  Chronic systolic HF, euvolemic.   He did receive Flu vaccine.  today          Current medicines are reviewed with the patient today.  The patient Has no concerns regarding medicines.  The following changes have been made:  See above Labs/ tests ordered today include:see above  Disposition:   FU:  see above  Signed, Laura Ingold, NP  12/18/2018 11:57 AM    [facility] [address], [place], [place]  [address]/ [address] [place], [place] Phone: [phone]; Fax: [phone]  [phone] 

## 2018-12-18 ENCOUNTER — Encounter: Payer: Self-pay | Admitting: Cardiology

## 2018-12-18 ENCOUNTER — Other Ambulatory Visit: Payer: Self-pay

## 2018-12-18 ENCOUNTER — Ambulatory Visit (INDEPENDENT_AMBULATORY_CARE_PROVIDER_SITE_OTHER): Payer: BC Managed Care – PPO | Admitting: Cardiology

## 2018-12-18 ENCOUNTER — Telehealth: Payer: Self-pay | Admitting: *Deleted

## 2018-12-18 VITALS — BP 108/60 | HR 85 | Ht 65.0 in | Wt 128.8 lb

## 2018-12-18 DIAGNOSIS — I255 Ischemic cardiomyopathy: Secondary | ICD-10-CM

## 2018-12-18 DIAGNOSIS — I2510 Atherosclerotic heart disease of native coronary artery without angina pectoris: Secondary | ICD-10-CM

## 2018-12-18 DIAGNOSIS — Q245 Malformation of coronary vessels: Secondary | ICD-10-CM | POA: Diagnosis not present

## 2018-12-18 DIAGNOSIS — E785 Hyperlipidemia, unspecified: Secondary | ICD-10-CM

## 2018-12-18 DIAGNOSIS — I5022 Chronic systolic (congestive) heart failure: Secondary | ICD-10-CM

## 2018-12-18 DIAGNOSIS — Z23 Encounter for immunization: Secondary | ICD-10-CM | POA: Diagnosis not present

## 2018-12-18 MED ORDER — ENTRESTO 24-26 MG PO TABS: 1.0000 | ORAL_TABLET | Freq: Two times a day (BID) | ORAL | 6 refills | Status: DC

## 2018-12-18 NOTE — Telephone Encounter (Signed)
-----   Message from Isaiah Serge, NP sent at 12/18/2018  4:38 PM EDT ----- Please ask pt to stop imdur and aldactone today and begin entresto on Wed.  Instead of tomorrow.  Thanks. Mickel Baas

## 2018-12-18 NOTE — Patient Instructions (Addendum)
Medication Instructions:   Your physician has recommended you make the following change in your medication:   1) Start Entresto 24-26MG , 1 tablet by mouth twice a day 2) Stop Imdur 3) Stop Spironolactone  If you need a refill on your cardiac medications before your next appointment, please call your pharmacy.   Lab work:  Your physician recommends that you return for lab work on 12/29/18 at 10:45AM  If you have labs (blood work) drawn today and your tests are completely normal, you will receive your results only by: Marland Kitchen MyChart Message (if you have MyChart) OR . A paper copy in the mail If you have any lab test that is abnormal or we need to change your treatment, we will call you to review the results.  Testing/Procedures:  Your physician has requested that you have a cardiac MRI. Cardiac MRI uses a computer to create images of your heart as its beating, producing both still and moving pictures of your heart and major blood vessels. For further information please visit [email or web address]. Please follow the instruction sheet given to you today for more information.   Follow-Up:  After your MRI is scheduled, call to make a follow up with Verdis Prime, MD  Any Other Special Instructions Will Be Listed Below (If Applicable).     Nuclear Medicine Exam A nuclear medicine exam is a safe and painless imaging test. It helps your health care provider detect and diagnose diseases. It also provides information about the ways your organs work and how they are structured. For a nuclear medicine exam, you will be given a radioactive tracer. This substance is absorbed by your body's organs. A large scanning machine detects the tracer and creates pictures of the areas that your health care provider wants to know more about. There are several kinds of nuclear medicine exams. They include the following:  CT scan.  MRI scan.  PET scan.  SPECT scan. Tell your health care provider about:  Any  allergies you have.  All medicines you are taking, including vitamins, herbs, eye drops, creams, and over-the-counter medicines.  Any problems you or family members have had with anesthetic medicines.  Any blood disorders you have.  Any surgeries you have had.  Any medical conditions you have.  Whether you are pregnant or may be pregnant.  Whether you are nursing. What are the risks? Generally, this is a safe procedure. However, problems may occur, such as an allergic reaction to the tracer, but this is rare. What happens before the procedure? Medicines Ask your health care provider about:  Changing or stopping your regular medicines. This is especially important if you are taking diabetes medicines or blood thinners.  Taking medicines such as aspirin and ibuprofen. These medicines can thin your blood. Do not take these medicines unless your health care provider tells you to take them.  Taking over-the-counter medicines, vitamins, herbs, and supplements. General instructions  Follow instructions from your health care provider about eating or drinking restrictions.  Do not wear jewelry.  Wear loose, comfortable clothing. You may be asked to wear a hospital gown for the procedure.  Bring previous imaging studies, such as X-rays, with you to the exam if they are available. What happens during the procedure?   An IV may be inserted into one of your veins.  You will be asked to lie on a table or sit in a chair.  You will be given the radioactive tracer. You may get: ? A pill or liquid to swallow. ?  An injection. ? Medicine through your IV. ? A gas to inhale.  A large scanning machine will be used to create images of your body. After the pictures are taken, you may have to wait so your health care provider can make sure that enough images were taken. The procedure may vary among health care providers and hospitals. What happens after the procedure?  You may go home after  the procedure and return to your usual activities, unless your health care provider tells you otherwise.  Drink enough water to keep your urine pale yellow. This helps to remove the radioactive tracer from your body.  It is up to you to get the results of your procedure. Ask your health care provider, or the department that is doing the procedure, when your results will be ready.  Get help right away if you have problems breathing. Summary  A nuclear medicine exam is a safe and painless imaging test that provides information about how your organs are working. It is also used to detect and diagnose diseases of various body organs.  Follow your health care provider's instructions about eating and drinking restrictions. Ask whether you should change or stop any medicines.  During the procedure, you will be given a radioactive tracer. A large scanning machine will create images of your body.  You may go home after the procedure and return to your regular activities. Follow your health care provider's instructions.  Get help right away if you have problems breathing. This information is not intended to replace advice given to you by your health care provider. Make sure you discuss any questions you have with your health care provider. Document Released: 04/15/2004 Document Revised: 01/25/2018 Document Reviewed: 01/25/2018 Elsevier Patient Education  2020 [employer].

## 2018-12-18 NOTE — Telephone Encounter (Signed)
Pt has been made aware to stop the Imdur and Aldactone today and to start the [facility] on Wednesday, 12/20/2018. Pt verbalized understanding.

## 2018-12-22 ENCOUNTER — Other Ambulatory Visit: Payer: Self-pay | Admitting: Interventional Cardiology

## 2018-12-22 MED ORDER — TICAGRELOR 90 MG PO TABS: 90.0000 mg | ORAL_TABLET | Freq: Two times a day (BID) | ORAL | 3 refills | Status: DC

## 2018-12-22 NOTE — Telephone Encounter (Signed)
Pt's medication was sent to pt's pharmacy as requested. Confirmation received.  °

## 2018-12-26 ENCOUNTER — Telehealth (HOSPITAL_COMMUNITY): Payer: Self-pay | Admitting: Pharmacist

## 2018-12-26 NOTE — Telephone Encounter (Signed)
Cardiac Rehab Medication Review by a Pharmacist  Does the patient  feel that his/her medications are working for him/her?  yes  Has the patient been experiencing any side effects to the medications prescribed?  no  Does the patient measure his/her own blood pressure or blood glucose at home?  no   Does the patient have any problems obtaining medications due to transportation or finances?   no  Understanding of regimen: excellent Understanding of indications: excellent Potential of compliance: excellent  Pharmacist comments: N/A   Lorel Monaco, PharmD PGY1 Ambulatory Care Resident [facility] # [phone] 12/26/2018 12:12 PM

## 2018-12-27 ENCOUNTER — Telehealth (HOSPITAL_COMMUNITY): Payer: Self-pay

## 2018-12-27 NOTE — Telephone Encounter (Signed)
Successful telephone encounter to [place] to confirm Cardiac Rehab orientation appointment for 12/28/2018 at 0930. Nursing assessment completed. Instructions for appointment provided. Patient screening for Covid-19 negative. Minyon Billiter E. Rollene Rotunda, RN, BSN

## 2018-12-28 ENCOUNTER — Other Ambulatory Visit: Payer: Self-pay

## 2018-12-28 ENCOUNTER — Encounter (HOSPITAL_COMMUNITY)
Admission: RE | Admit: 2018-12-28 | Discharge: 2018-12-28 | Disposition: A | Discharge status: Home / Self Care | Payer: BC Managed Care – PPO | Source: Ambulatory Visit | Attending: Interventional Cardiology | Admitting: Interventional Cardiology

## 2018-12-28 ENCOUNTER — Telehealth: Payer: Self-pay | Admitting: *Deleted

## 2018-12-28 ENCOUNTER — Encounter: Payer: Self-pay | Admitting: *Deleted

## 2018-12-28 ENCOUNTER — Encounter (HOSPITAL_COMMUNITY): Payer: Self-pay

## 2018-12-28 VITALS — BP 100/67 | HR 90 | Temp 97.3°F | Ht 66.0 in | Wt 127.9 lb

## 2018-12-28 DIAGNOSIS — I2102 ST elevation (STEMI) myocardial infarction involving left anterior descending coronary artery: Secondary | ICD-10-CM | POA: Insufficient documentation

## 2018-12-28 DIAGNOSIS — Z955 Presence of coronary angioplasty implant and graft: Secondary | ICD-10-CM | POA: Insufficient documentation

## 2018-12-28 HISTORY — DX: Heart failure, unspecified: I50.9

## 2018-12-28 NOTE — Progress Notes (Signed)
Cardiac Individual Treatment Plan  Patient Details  Name: James [name] MRN: [id number] Date of Birth: 1980/06/13 Referring Provider:     [facility] from 12/28/2018 in [place]  Referring Provider  Dr. Tamala Julian      Initial Encounter Date:    CARDIAC REHAB PHASE II ORIENTATION from 12/28/2018 in [place]  Date  12/28/18      Visit Diagnosis: Acute ST elevation myocardial infarction (STEMI) involving left anterior descending (LAD) coronary artery ([place]) 10/12/18  Status post coronary artery stent placement S/P DES LAD 10/12/18  Patient's Home Medications on Admission:  Current Outpatient Medications:  .  aspirin 81 MG chewable tablet, Chew 1 tablet (81 mg total) by mouth daily., Disp: 90 tablet, Rfl: 1 .  metoprolol succinate (TOPROL-XL) 25 MG 24 hr tablet, Take 0.5 tablets (12.5 mg total) by mouth daily., Disp: 15 tablet, Rfl: 6 .  nitroGLYCERIN (NITROSTAT) 0.4 MG SL tablet, Place 1 tablet (0.4 mg total) under the tongue every 5 (five) minutes x 3 doses as needed for chest pain. (Patient not taking: Reported on 12/26/2018), Disp: 25 tablet, Rfl: 2 .  pantoprazole (PROTONIX) 40 MG tablet, Take 1 tablet (40 mg total) by mouth daily., Disp: 30 tablet, Rfl: 1 .  rosuvastatin (CRESTOR) 10 MG tablet, Take 1 tablet (10 mg total) by mouth daily. Do not start taking until 12/04/2018, Disp: 90 tablet, Rfl: 3 .  sacubitril-valsartan (ENTRESTO) 24-26 MG, Take 1 tablet by mouth 2 (two) times daily., Disp: 60 tablet, Rfl: 6 .  ticagrelor (BRILINTA) 90 MG TABS tablet, Take 1 tablet (90 mg total) by mouth 2 (two) times daily., Disp: 180 tablet, Rfl: 3  Past Medical History: Past Medical History:  Diagnosis Date  . Acute combined systolic and diastolic heart failure ([place]) 11/06/18  . Acute ST elevation myocardial infarction (STEMI) ([place]) 10/12/2018  . At risk for sudden cardiac death 11-06-18  .  Cardiomyopathy, ischemic 11/14/2018  . CHF (congestive heart failure) ([place])   . 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 Coronary artery disease involving native coronary artery of native heart with unstable angina pectoris ([place]) 11-06-18  . Delayed presentation of acute anterolateral STEMI 10/12/2018  . Hyperlipidemia    possible elevated triglycerides  . Ischemic cardiomyopathy    EF 30-35% // Echo 11/2018: Anteroseptal and anterior hypokinesis, EF 30-35, small circumferential pericardial effusion, no intracardiac thrombi   . Presence of drug coated stent in LAD coronary artery Nov 06, 2018   Ostial LAD DES PCI: Resolute Onyx 3.5 mm x 18 mm - 3.6 mm  . STEMI (ST elevation myocardial infarction) ([place]) 10/12/2018    Tobacco Use: Social History   Tobacco Use  Smoking Status Never Smoker  Smokeless Tobacco Never Used    Labs: Recent Review Flowsheet Data    Labs for ITP Cardiac and Pulmonary Rehab Latest Ref Rng & Units 11/13/2018 11/13/2018 11/13/2018 11/13/2018 12/01/2018   Cholestrol 100 - 199 mg/dL - - - - 157   LDLCALC 0 - 99 mg/dL - - - - 92   HDL >39 mg/dL - - - - 29(L)   Trlycerides 0 - 149 mg/dL - - - - 212(H)   Hemoglobin A1c 4.8 - 5.6 % - - - - -   HCO3 20.0 - 28.0 mmol/L 20.9 20.8 20.9 20.0 -   TCO2 22 - 32 mmol/L _0 21(L) -   ACIDBASEDEF 0.0 - 2.0 mmol/L 4.0(H) 4.0(H) 4.0(H)  5.0(H) -   O2SAT % 76.0 75.0 79.0 85.0 -      Capillary Blood Glucose: Lab Results  Component Value Date   GLUCAP 117 (H) 10/12/2018     Exercise Target Goals: Exercise Program Goal: Individual exercise prescription set using results from initial 6 min walk test and THRR while considering  patient's activity barriers and safety.   Exercise Prescription Goal: Starting with aerobic activity 30 plus minutes a day, 3 days per week for initial exercise prescription. Provide home exercise prescription and guidelines that participant acknowledges understanding prior to  discharge.  Activity Barriers & Risk Stratification: Activity Barriers & Cardiac Risk Stratification - 12/28/18 1130      Activity Barriers & Cardiac Risk Stratification   Activity Barriers  None    Cardiac Risk Stratification  High       6 Minute Walk: 6 Minute Walk    Row Name 12/28/18 1129         6 Minute Walk   Phase  Initial     Distance  1412 feet     Walk Time  6 minutes     # of Rest Breaks  0     MPH  2.6     METS  5.1     RPE  11     Perceived Dyspnea   0     VO2 Peak  18.04     Symptoms  No     Resting HR  90 bpm     Resting BP  92/60     Resting Oxygen Saturation   99 %     Exercise Oxygen Saturation  during 6 min walk  100 %     Max Ex. HR  102 bpm     Max Ex. BP  114/80     2 Minute Post BP  112/78        Oxygen Initial Assessment:   Oxygen Re-Evaluation:   Oxygen Discharge (Final Oxygen Re-Evaluation):   Initial Exercise Prescription: Initial Exercise Prescription - 12/28/18 1100      Date of Initial Exercise RX and Referring Provider   Date  12/28/18    Referring Provider  Dr. Katrinka BlazingSmith    Expected Discharge Date  02/23/19      Recumbant Bike   Level  2    Watts  60    Minutes  15    METs  5.15      NuStep   Level  3    SPM  85    Minutes  15    METs  3      Prescription Details   Frequency (times per week)  3    Duration  Progress to 30 minutes of continuous aerobic without signs/symptoms of physical distress      Intensity   THRR 40-80% of Max Heartrate  73-146    Ratings of Perceived Exertion  11-13      Progression   Progression  Continue to progress workloads to maintain intensity without signs/symptoms of physical distress.      Resistance Training   Training Prescription  Yes    Weight  4 lbs.     Reps  10-15       Perform Capillary Blood Glucose checks as needed.  Exercise Prescription Changes:   Exercise Comments:   Exercise Goals and Review: Exercise Goals    Row Name 12/28/18 1132              Exercise Goals  Increase Physical Activity  Yes       Intervention  Provide advice, education, support and counseling about physical activity/exercise needs.;Develop an individualized exercise prescription for aerobic and resistive training based on initial evaluation findings, risk stratification, comorbidities and participant's personal goals.       Expected Outcomes  Short Term: Attend rehab on a regular basis to increase amount of physical activity.;Long Term: Add in home exercise to make exercise part of routine and to increase amount of physical activity.;Long Term: Exercising regularly at least 3-5 days a week.       Increase Strength and Stamina  Yes       Intervention  Provide advice, education, support and counseling about physical activity/exercise needs.;Develop an individualized exercise prescription for aerobic and resistive training based on initial evaluation findings, risk stratification, comorbidities and participant's personal goals.       Expected Outcomes  Short Term: Increase workloads from initial exercise prescription for resistance, speed, and METs.;Short Term: Perform resistance training exercises routinely during rehab and add in resistance training at home;Long Term: Improve cardiorespiratory fitness, muscular endurance and strength as measured by increased METs and functional capacity ( )       Able to understand and use rate of perceived exertion (RPE) scale  Yes       Intervention  Provide education and explanation on how to use RPE scale       Expected Outcomes  Short Term: Able to use RPE daily in rehab to express subjective intensity level;Long Term:  Able to use RPE to guide intensity level when exercising independently       Knowledge and understanding of Target Heart Rate Range (THRR)  Yes       Intervention  Provide education and explanation of THRR including how the numbers were predicted and where they are located for reference       Expected Outcomes  Short  Term: Able to state/look up THRR;Long Term: Able to use THRR to govern intensity when exercising independently;Short Term: Able to use daily as guideline for intensity in rehab       Able to check pulse independently  Yes       Intervention  Provide education and demonstration on how to check pulse in carotid and radial arteries.;Review the importance of being able to check your own pulse for safety during independent exercise       Expected Outcomes  Short Term: Able to explain why pulse checking is important during independent exercise;Long Term: Able to check pulse independently and accurately       Understanding of Exercise Prescription  Yes       Intervention  Provide education, explanation, and written materials on patient's individual exercise prescription       Expected Outcomes  Short Term: Able to explain program exercise prescription;Long Term: Able to explain home exercise prescription to exercise independently          Exercise Goals Re-Evaluation :    Discharge Exercise Prescription (Final Exercise Prescription Changes):   Nutrition:  Target Goals: Understanding of nutrition guidelines, daily intake of sodium 1500mg , cholesterol 200mg , calories 30% from fat and 7% or less from saturated fats, daily to have 5 or more servings of fruits and vegetables.  Biometrics: Pre Biometrics - 12/28/18 1133      Pre Biometrics   Height   (1.676 m)    Weight  58 kg    Waist Circumference  30 inches    Hip Circumference  34 inches  Waist to Hip Ratio  0.88 %    BMI (Calculated)  20.65    Triceps Skinfold  18 mm    % Body Fat  20.2 %    Grip Strength  33 kg    Flexibility  11 in    Single Leg Stand  30 seconds        Nutrition Therapy Plan and Nutrition Goals:   Nutrition Assessments:   Nutrition Goals Re-Evaluation:   Nutrition Goals Discharge (Final Nutrition Goals Re-Evaluation):   Psychosocial: Target Goals: Acknowledge presence or absence of significant  depression and/or stress, maximize coping skills, provide positive support system. Participant is able to verbalize types and ability to use techniques and skills needed for reducing stress and depression.  Initial Review & Psychosocial Screening: Initial Psych Review & Screening - 12/28/18 1201      Initial Review   Current issues with  Current Stress Concerns    Source of Stress Concerns  Chronic Illness    Comments  patient currently on short term disability from his job. Wants to have time to recover fully from his heart attack before returning to work.      Family Dynamics   Good Support System?  Yes   Ke[place] has his wife for support     Barriers   Psychosocial barriers to participate in program  The patient should benefit from training in stress management and relaxation.      Screening Interventions   Interventions  Encouraged to exercise;Provide feedback about the scores to participant;To provide support and resources with identified psychosocial needs    Expected Outcomes  Short Term goal: Utilizing psychosocial counselor, staff and physician to assist with identification of specific Stressors or current issues interfering with healing process. Setting desired goal for each stressor or current issue identified.;Long Term Goal: Stressors or current issues are controlled or eliminated.;Short Term goal: Identification and review with participant of any Quality of Life or Depression concerns found by scoring the questionnaire.       Quality of Life Scores: Quality of Life - 12/28/18 1100      Quality of Life   Select  Quality of Life      Quality of Life Scores   Health/Function Pre  18.17 %    Socioeconomic Pre  25.64 %    Psych/Spiritual Pre  26.57 %    Family Pre  25.2 %    GLOBAL Pre  22.47 %      Scores of 19 and below usually indicate a poorer quality of life in these areas.  A difference of  2-3 points is a clinically meaningful difference.  A difference of 2-3 points  in the total score of the Quality of Life Index has been associated with significant improvement in overall quality of life, self-image, physical symptoms, and general health in studies assessing change in quality of life.  PHQ-9: Recent Review Flowsheet Data    There is no flowsheet data to display.     Interpretation of Total Score  Total Score Depression Severity:  1-4 = Minimal depression, 5-9 = Mild depression, 10-14 = Moderate depression, 15-19 = Moderately severe depression, 20-27 = Severe depression   Psychosocial Evaluation and Intervention:   Psychosocial Re-Evaluation:   Psychosocial Discharge (Final Psychosocial Re-Evaluation):   Vocational Rehabilitation: Provide vocational rehab assistance to qualifying candidates.   Vocational Rehab Evaluation & Intervention: Vocational Rehab - 12/28/18 1204      Initial Vocational Rehab Evaluation & Intervention   Assessment shows need  for Vocational Rehabilitation  No       Education: Education Goals: Education classes will be provided on a weekly basis, covering required topics. Participant will state understanding/return demonstration of topics presented.  Learning Barriers/Preferences: Learning Barriers/Preferences - 12/28/18 1134      Learning Barriers/Preferences   Learning Barriers  Language    Learning Preferences  Written Material       Education Topics: Hypertension, Hypertension Reduction -Define heart disease and high blood pressure. Discus how high blood pressure affects the body and ways to reduce high blood pressure.   Exercise and Your Heart -Discuss why it is important to exercise, the FITT principles of exercise, normal and abnormal responses to exercise, and how to exercise safely.   Angina -Discuss definition of angina, causes of angina, treatment of angina, and how to decrease risk of having angina.   Cardiac Medications -Review what the following cardiac medications are used for, how they  affect the body, and side effects that may occur when taking the medications.  Medications include Aspirin, Beta blockers, calcium channel blockers, ACE Inhibitors, angiotensin receptor blockers, diuretics, digoxin, and antihyperlipidemics.   Congestive Heart Failure -Discuss the definition of CHF, how to live with CHF, the signs and symptoms of CHF, and how keep track of weight and sodium intake.   Heart Disease and Intimacy -Discus the effect sexual activity has on the heart, how changes occur during intimacy as we age, and safety during sexual activity.   Smoking Cessation / COPD -Discuss different methods to quit smoking, the health benefits of quitting smoking, and the definition of COPD.   Nutrition I: Fats -Discuss the types of cholesterol, what cholesterol does to the heart, and how cholesterol levels can be controlled.   Nutrition II: Labels -Discuss the different components of food labels and how to read food label   Heart Parts/Heart Disease and PAD -Discuss the anatomy of the heart, the pathway of blood circulation through the heart, and these are affected by heart disease.   Stress I: Signs and Symptoms -Discuss the causes of stress, how stress may lead to anxiety and depression, and ways to limit stress.   Stress II: Relaxation -Discuss different types of relaxation techniques to limit stress.   Warning Signs of Stroke / TIA -Discuss definition of a stroke, what the signs and symptoms are of a stroke, and how to identify when someone is having stroke.   Knowledge Questionnaire Score: Knowledge Questionnaire Score - 12/28/18 1100      Knowledge Questionnaire Score   Pre Score  21/24       Core Components/Risk Factors/Patient Goals at Admission: Personal Goals and Risk Factors at Admission - 12/28/18 1205      Core Components/Risk Factors/Patient Goals on Admission   Intervention  Weight Management: Develop a combined nutrition and exercise program  designed to reach desired caloric intake, while maintaining appropriate intake of nutrient and fiber, sodium and fats, and appropriate energy expenditure required for the weight goal.;Weight Management: Provide education and appropriate resources to help participant work on and attain dietary goals.    Admit Weight  127 lb 13.9 oz (58 kg)    Expected Outcomes  Weight Gain: Understanding of general recommendations for a high calorie, high protein meal plan that promotes weight gain by distributing calorie intake throughout the day with the consumption for 4-5 meals, snacks, and/or supplements;Understanding recommendations for meals to include 15-35% energy as protein, 25-35% energy from fat, 35-60% energy from carbohydrates, less than 200mg  of dietary  cholesterol, 20-35 gm of total fiber daily;Understanding of distribution of calorie intake throughout the day with the consumption of 4-5 meals/snacks;Weight Maintenance: Understanding of the daily nutrition guidelines, which includes 25-35% calories from fat, 7% or less cal from saturated fats, less than 200mg  cholesterol, less than 1.5gm of sodium, & 5 or more servings of fruits and vegetables daily;Long Term: Adherence to nutrition and physical activity/exercise program aimed toward attainment of established weight goal;Short Term: Continue to assess and modify interventions until short term weight is achieved    Heart Failure  Yes    Intervention  --   provide education to patient and give educational resources as needed   Expected Outcomes  Improve functional capacity of life;Short term: Attendance in program 2-3 days a week with increased exercise capacity. Reported lower sodium intake. Reported increased fruit and vegetable intake. Reports medication compliance.;Short term: Daily weights obtained and reported for increase. Utilizing diuretic protocols set by physician.;Long term: Adoption of self-care skills and reduction of barriers for early signs and  symptoms recognition and intervention leading to self-care maintenance.    Lipids  Yes    Intervention  Provide education and support for participant on nutrition & aerobic/resistive exercise along with prescribed medications to achieve LDL 70mg , HDL >40mg .    Expected Outcomes  Short Term: Participant states understanding of desired cholesterol values and is compliant with medications prescribed. Participant is following exercise prescription and nutrition guidelines.;Long Term: Cholesterol controlled with medications as prescribed, with individualized exercise RX and with personalized nutrition plan. Value goals: LDL < 70mg , HDL > 40 mg.    Stress  Yes    Intervention  Offer individual and/or small group education and counseling on adjustment to heart disease, stress management and health-related lifestyle change. Teach and support self-help strategies.    Expected Outcomes  Short Term: Participant demonstrates changes in health-related behavior, relaxation and other stress management skills, ability to obtain effective social support, and compliance with psychotropic medications if prescribed.;Long Term: Emotional wellbeing is indicated by absence of clinically significant psychosocial distress or social isolation.       Core Components/Risk Factors/Patient Goals Review:    Core Components/Risk Factors/Patient Goals at Discharge (Final Review):    ITP Comments: ITP Comments    Row Name 12/28/18 1025           ITP Comments  Dr. , Medical Director          Comments: 02/27/19 attended orientation on 12/28/2018 to review rules and guidelines for program.  Completed 6 minute walk test, Intitial ITP, and exercise prescription.  VSS. Telemetry-Sinus rhythm.  Asymptomatic. Safety measures and social distancing in place per CDC guidelines. Manual blood pressure 92/60. Automatic blood pressure cuff 109/79 sitting. Standing blood pressure 100/67 standing. Patient wearing life vest  today.Kaleen Mask, RN,BSN 12/28/2018 12:15 PM

## 2018-12-28 NOTE — Telephone Encounter (Signed)
Left message regarding Cardiac MRI appointment scheduled for 01/11/19 at 1:00pm at [facility]--arrival time is 12:15pm at the 1st floor admissions office.  Will also mail information to patient

## 2018-12-29 ENCOUNTER — Other Ambulatory Visit: Payer: BC Managed Care – PPO | Admitting: *Deleted

## 2018-12-29 DIAGNOSIS — I255 Ischemic cardiomyopathy: Secondary | ICD-10-CM

## 2018-12-29 DIAGNOSIS — Q245 Malformation of coronary vessels: Secondary | ICD-10-CM

## 2018-12-29 LAB — BASIC METABOLIC PANEL
BUN/Creatinine Ratio: 10 (ref 9–20)
BUN: 11 mg/dL (ref 6–20)
CO2: 24 mmol/L (ref 20–29)
Calcium: 10.1 mg/dL (ref 8.7–10.2)
Chloride: 104 mmol/L (ref 96–106)
Creatinine, Ser: 1.15 mg/dL (ref 0.76–1.27)
GFR calc Af Amer: 93 mL/min/{1.73_m2} (ref >59)
GFR calc non Af Amer: 81 mL/min/{1.73_m2} (ref >59)
Glucose: 90 mg/dL (ref 65–99)
Potassium: 4.5 mmol/L (ref 3.5–5.2)
Sodium: 141 mmol/L (ref 134–144)

## 2019-01-01 ENCOUNTER — Encounter (HOSPITAL_COMMUNITY)
Admission: RE | Admit: 2019-01-01 | Discharge: 2019-01-01 | Disposition: A | Discharge status: Home / Self Care | Payer: BC Managed Care – PPO | Source: Ambulatory Visit | Attending: Interventional Cardiology | Admitting: Interventional Cardiology

## 2019-01-01 ENCOUNTER — Other Ambulatory Visit: Payer: Self-pay

## 2019-01-01 DIAGNOSIS — I2102 ST elevation (STEMI) myocardial infarction involving left anterior descending coronary artery: Secondary | ICD-10-CM | POA: Diagnosis not present

## 2019-01-01 DIAGNOSIS — Z955 Presence of coronary angioplasty implant and graft: Secondary | ICD-10-CM

## 2019-01-01 NOTE — Progress Notes (Signed)
Daily Session Note  Patient Details  Name: James [name] MRN: [id number] Date of Birth: 03/11/1981 Referring Provider:     CARDIAC REHAB PHASE II ORIENTATION from 12/28/2018 in [facility]  Referring Provider  Dr. Smith      Encounter Date: 01/01/2019  Check In:   Capillary Blood Glucose: No results found for this or any previous visit (from the past 24 hour(s)).    Social History   Tobacco Use  Smoking Status Never Smoker  Smokeless Tobacco Never Used    Goals Met:  Exercise tolerated well No report of cardiac concerns or symptoms  Goals Unmet:  Not wearing life vest and was reminded to wear at all times except when not showering.  Comments: Pt started cardiac rehab today.  Pt tolerated light exercise without difficulty. VSS, telemetry-Sinus Rhythm, asymptomatic.  Medication list reconciled. Pt denies barriers to medicaiton compliance.  PSYCHOSOCIAL ASSESSMENT:  PHQ-0. Pt exhibits positive coping skills, hopeful outlook with supportive family. No psychosocial needs identified at this time, no psychosocial interventions necessary.    Pt enjoys playing with his daughter.   Pt oriented to exercise equipment and routine.    Understanding verbalized.Maria Whitaker, RN,BSN 01/02/2019 3:51 PM   Dr. Traci Turner is Medical Director for Cardiac Rehab at [facility]. 

## 2019-01-02 NOTE — Progress Notes (Signed)
Cardiac Individual Treatment Plan  Patient Details  Name: James [name] MRN: [id number] Date of Birth: 1980/06/13 Referring Provider:     [facility] from 12/28/2018 in [facility]  Referring Provider  Dr. Tamala Julian      Initial Encounter Date:    CARDIAC REHAB PHASE II ORIENTATION from 12/28/2018 in [facility]  Date  12/28/18      Visit Diagnosis: Acute ST elevation myocardial infarction (STEMI) involving left anterior descending (LAD) coronary artery ([place]) 10/12/18  Status post coronary artery stent placement S/P DES LAD 10/12/18  Patient's Home Medications on Admission:  Current Outpatient Medications:  .  aspirin 81 MG chewable tablet, Chew 1 tablet (81 mg total) by mouth daily., Disp: 90 tablet, Rfl: 1 .  metoprolol succinate (TOPROL-XL) 25 MG 24 hr tablet, Take 0.5 tablets (12.5 mg total) by mouth daily., Disp: 15 tablet, Rfl: 6 .  nitroGLYCERIN (NITROSTAT) 0.4 MG SL tablet, Place 1 tablet (0.4 mg total) under the tongue every 5 (five) minutes x 3 doses as needed for chest pain. (Patient not taking: Reported on 12/26/2018), Disp: 25 tablet, Rfl: 2 .  pantoprazole (PROTONIX) 40 MG tablet, Take 1 tablet (40 mg total) by mouth daily., Disp: 30 tablet, Rfl: 1 .  rosuvastatin (CRESTOR) 10 MG tablet, Take 1 tablet (10 mg total) by mouth daily. Do not start taking until 12/04/2018, Disp: 90 tablet, Rfl: 3 .  sacubitril-valsartan (ENTRESTO) 24-26 MG, Take 1 tablet by mouth 2 (two) times daily., Disp: 60 tablet, Rfl: 6 .  ticagrelor (BRILINTA) 90 MG TABS tablet, Take 1 tablet (90 mg total) by mouth 2 (two) times daily., Disp: 180 tablet, Rfl: 3  Past Medical History: Past Medical History:  Diagnosis Date  . Acute combined systolic and diastolic heart failure ([place]) 11/06/18  . Acute ST elevation myocardial infarction (STEMI) ([place]) 10/12/2018  . At risk for sudden cardiac death 11-06-18  .  Cardiomyopathy, ischemic 11/14/2018  . CHF (congestive heart failure) ([place])   . 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 Coronary artery disease involving native coronary artery of native heart with unstable angina pectoris ([place]) 11-06-18  . Delayed presentation of acute anterolateral STEMI 10/12/2018  . Hyperlipidemia    possible elevated triglycerides  . Ischemic cardiomyopathy    EF 30-35% // Echo 11/2018: Anteroseptal and anterior hypokinesis, EF 30-35, small circumferential pericardial effusion, no intracardiac thrombi   . Presence of drug coated stent in LAD coronary artery Nov 06, 2018   Ostial LAD DES PCI: Resolute Onyx 3.5 mm x 18 mm - 3.6 mm  . STEMI (ST elevation myocardial infarction) ([place]) 10/12/2018    Tobacco Use: Social History   Tobacco Use  Smoking Status Never Smoker  Smokeless Tobacco Never Used    Labs: Recent Review Flowsheet Data    Labs for ITP Cardiac and Pulmonary Rehab Latest Ref Rng & Units 11/13/2018 11/13/2018 11/13/2018 11/13/2018 12/01/2018   Cholestrol 100 - 199 mg/dL - - - - 157   LDLCALC 0 - 99 mg/dL - - - - 92   HDL >39 mg/dL - - - - 29(L)   Trlycerides 0 - 149 mg/dL - - - - 212(H)   Hemoglobin A1c 4.8 - 5.6 % - - - - -   HCO3 20.0 - 28.0 mmol/L 20.9 20.8 20.9 20.0 -   TCO2 22 - 32 mmol/L _0 21(L) -   ACIDBASEDEF 0.0 - 2.0 mmol/L 4.0(H) 4.0(H) 4.0(H)  5.0(H) -   O2SAT % 76.0 75.0 79.0 85.0 -      Capillary Blood Glucose: Lab Results  Component Value Date   GLUCAP 117 (H) 10/12/2018     Exercise Target Goals: Exercise Program Goal: Individual exercise prescription set using results from initial 6 min walk test and THRR while considering  patient's activity barriers and safety.   Exercise Prescription Goal: Starting with aerobic activity 30 plus minutes a day, 3 days per week for initial exercise prescription. Provide home exercise prescription and guidelines that participant acknowledges understanding prior to  discharge.  Activity Barriers & Risk Stratification: Activity Barriers & Cardiac Risk Stratification - 12/28/18 1130      Activity Barriers & Cardiac Risk Stratification   Activity Barriers  None    Cardiac Risk Stratification  High       6 Minute Walk: 6 Minute Walk    Row Name 12/28/18 1129         6 Minute Walk   Phase  Initial     Distance  1412 feet     Walk Time  6 minutes     # of Rest Breaks  0     MPH  2.6     METS  5.1     RPE  11     Perceived Dyspnea   0     VO2 Peak  18.04     Symptoms  No     Resting HR  90 bpm     Resting BP  92/60     Resting Oxygen Saturation   99 %     Exercise Oxygen Saturation  during 6 min walk  100 %     Max Ex. HR  102 bpm     Max Ex. BP  114/80     2 Minute Post BP  112/78        Oxygen Initial Assessment:   Oxygen Re-Evaluation:   Oxygen Discharge (Final Oxygen Re-Evaluation):   Initial Exercise Prescription: Initial Exercise Prescription - 12/28/18 1100      Date of Initial Exercise RX and Referring Provider   Date  12/28/18    Referring Provider  Dr. Katrinka Blazing    Expected Discharge Date  02/23/19      Recumbant Bike   Level  2    Watts  60    Minutes  15    METs  5.15      NuStep   Level  3    SPM  85    Minutes  15    METs  3      Prescription Details   Frequency (times per week)  3    Duration  Progress to 30 minutes of continuous aerobic without signs/symptoms of physical distress      Intensity   THRR 40-80% of Max Heartrate  73-146    Ratings of Perceived Exertion  11-13      Progression   Progression  Continue to progress workloads to maintain intensity without signs/symptoms of physical distress.      Resistance Training   Training Prescription  Yes    Weight  4 lbs.     Reps  10-15       Perform Capillary Blood Glucose checks as needed.  Exercise Prescription Changes:  Exercise Prescription Changes    Row Name 01/01/19 1100             Response to Exercise   Blood Pressure  (Admit)  88/54  Blood Pressure (Exercise)  118/80       Blood Pressure (Exit)  113/79       Heart Rate (Admit)  85 bpm       Heart Rate (Exercise)  127 bpm       Heart Rate (Exit)  95 bpm       Rating of Perceived Exertion (Exercise)  12       Symptoms  None       Comments  Pt first day of CR program.        Duration  Continue with 30 min of aerobic exercise without signs/symptoms of physical distress.       Intensity  THRR unchanged         Progression   Progression  Continue to progress workloads to maintain intensity without signs/symptoms of physical distress.       Average METs  4.1         Resistance Training   Training Prescription  Yes       Weight  4 lbs.        Reps  10-15       Time  10 Minutes         Interval Training   Interval Training  No         Recumbant Bike   Level  2       Watts  60       Minutes  15       METs  5.3         NuStep   Level  3       SPM  85       Minutes  15       METs  3          Exercise Comments:  Exercise Comments    Row Name 01/01/19 1149           Exercise Comments  Pt first day of CR program. Pt tolerated exercise Rx well.          Exercise Goals and Review:  Exercise Goals    Row Name 12/28/18 1132             Exercise Goals   Increase Physical Activity  Yes       Intervention  Provide advice, education, support and counseling about physical activity/exercise needs.;Develop an individualized exercise prescription for aerobic and resistive training based on initial evaluation findings, risk stratification, comorbidities and participant's personal goals.       Expected Outcomes  Short Term: Attend rehab on a regular basis to increase amount of physical activity.;Long Term: Add in home exercise to make exercise part of routine and to increase amount of physical activity.;Long Term: Exercising regularly at least 3-5 days a week.       Increase Strength and Stamina  Yes       Intervention  Provide advice,  education, support and counseling about physical activity/exercise needs.;Develop an individualized exercise prescription for aerobic and resistive training based on initial evaluation findings, risk stratification, comorbidities and participant's personal goals.       Expected Outcomes  Short Term: Increase workloads from initial exercise prescription for resistance, speed, and METs.;Short Term: Perform resistance training exercises routinely during rehab and add in resistance training at home;Long Term: Improve cardiorespiratory fitness, muscular endurance and strength as measured by increased METs and functional capacity (6MWT)       Able to understand and use rate of perceived exertion (RPE) scale  Yes       Intervention  Provide education and explanation on how to use RPE scale       Expected Outcomes  Short Term: Able to use RPE daily in rehab to express subjective intensity level;Long Term:  Able to use RPE to guide intensity level when exercising independently       Knowledge and understanding of Target Heart Rate Range (THRR)  Yes       Intervention  Provide education and explanation of THRR including how the numbers were predicted and where they are located for reference       Expected Outcomes  Short Term: Able to state/look up THRR;Long Term: Able to use THRR to govern intensity when exercising independently;Short Term: Able to use daily as guideline for intensity in rehab       Able to check pulse independently  Yes       Intervention  Provide education and demonstration on how to check pulse in carotid and radial arteries.;Review the importance of being able to check your own pulse for safety during independent exercise       Expected Outcomes  Short Term: Able to explain why pulse checking is important during independent exercise;Long Term: Able to check pulse independently and accurately       Understanding of Exercise Prescription  Yes       Intervention  Provide education, explanation,  and written materials on patient's individual exercise prescription       Expected Outcomes  Short Term: Able to explain program exercise prescription;Long Term: Able to explain home exercise prescription to exercise independently          Exercise Goals Re-Evaluation : Exercise Goals Re-Evaluation    Row Name 01/01/19 1147             Exercise Goal Re-Evaluation   Exercise Goals Review  Increase Physical Activity;Understanding of Exercise Prescription;Increase Strength and Stamina;Knowledge and understanding of Target Heart Rate Range (THRR);Able to understand and use rate of perceived exertion (RPE) scale       Comments  Pt first day of CR program. Pt tolerated exercise Rx well. Pt understadns THRR, RPE scale, and exercise Rx.       Expected Outcomes  Will continue to monitor and progress Pt as tolerated.           Discharge Exercise Prescription (Final Exercise Prescription Changes): Exercise Prescription Changes - 01/01/19 1100      Response to Exercise   Blood Pressure (Admit)  88/54    Blood Pressure (Exercise)  118/80    Blood Pressure (Exit)  113/79    Heart Rate (Admit)  85 bpm    Heart Rate (Exercise)  127 bpm    Heart Rate (Exit)  95 bpm    Rating of Perceived Exertion (Exercise)  12    Symptoms  None    Comments  Pt first day of CR program.     Duration  Continue with 30 min of aerobic exercise without signs/symptoms of physical distress.    Intensity  THRR unchanged      Progression   Progression  Continue to progress workloads to maintain intensity without signs/symptoms of physical distress.    Average METs  4.1      Resistance Training   Training Prescription  Yes    Weight  4 lbs.     Reps  10-15    Time  10 Minutes      Interval Training   Interval Training  No      Recumbant Bike   Level  2    Watts  60    Minutes  15    METs  5.3      NuStep   Level  3    SPM  85    Minutes  15    METs  3       Nutrition:  Target Goals:  Understanding of nutrition guidelines, daily intake of sodium 1500mg , cholesterol 200mg , calories 30% from fat and 7% or less from saturated fats, daily to have 5 or more servings of fruits and vegetables.  Biometrics: Pre Biometrics - 12/28/18 1133      Pre Biometrics   Height   (1.676 m)    Weight  127 lb 13.9 oz (58 kg)    Waist Circumference  30 inches    Hip Circumference  34 inches    Waist to Hip Ratio  0.88 %    BMI (Calculated)  20.65    Triceps Skinfold  18 mm    % Body Fat  20.2 %    Grip Strength  33 kg    Flexibility  11 in    Single Leg Stand  30 seconds        Nutrition Therapy Plan and Nutrition Goals:   Nutrition Assessments:   Nutrition Goals Re-Evaluation:   Nutrition Goals Discharge (Final Nutrition Goals Re-Evaluation):   Psychosocial: Target Goals: Acknowledge presence or absence of significant depression and/or stress, maximize coping skills, provide positive support system. Participant is able to verbalize types and ability to use techniques and skills needed for reducing stress and depression.  Initial Review & Psychosocial Screening: Initial Psych Review & Screening - 12/28/18 1201      Initial Review   Current issues with  Current Stress Concerns    Source of Stress Concerns  Chronic Illness    Comments  patient currently on short term disability from his job. Wants to have time to recover fully from his heart attack before returning to work.      Family Dynamics   Good Support System?  Yes   Seiya has his wife for support     Barriers   Psychosocial barriers to participate in program  The patient should benefit from training in stress management and relaxation.      Screening Interventions   Interventions  Encouraged to exercise;Provide feedback about the scores to participant;To provide support and resources with identified psychosocial needs    Expected Outcomes  Short Term goal: Utilizing psychosocial counselor, staff and  physician to assist with identification of specific Stressors or current issues interfering with healing process. Setting desired goal for each stressor or current issue identified.;Long Term Goal: Stressors or current issues are controlled or eliminated.;Short Term goal: Identification and review with participant of any Quality of Life or Depression concerns found by scoring the questionnaire.       Quality of Life Scores: Quality of Life - 12/28/18 1100      Quality of Life   Select  Quality of Life      Quality of Life Scores   Health/Function Pre  18.17 %    Socioeconomic Pre  25.64 %    Psych/Spiritual Pre  26.57 %    Family Pre  25.2 %    GLOBAL Pre  22.47 %      Scores of 19 and below usually indicate a poorer quality of life in these areas.  A difference of  2-3 points is a clinically meaningful difference.  A difference of 2-3 points in the total score of the Quality of Life Index has been associated with significant improvement in overall quality of life, self-image, physical symptoms, and general health in studies assessing change in quality of life.  PHQ-9: Recent Review Flowsheet Data    Depression screen [facility] 2/9 01/01/2019   Decreased Interest 0   Down, Depressed, Hopeless 0   PHQ - 2 Score 0     Interpretation of Total Score  Total Score Depression Severity:  1-4 = Minimal depression, 5-9 = Mild depression, 10-14 = Moderate depression, 15-19 = Moderately severe depression, 20-27 = Severe depression   Psychosocial Evaluation and Intervention:   Psychosocial Re-Evaluation:   Psychosocial Discharge (Final Psychosocial Re-Evaluation):   Vocational Rehabilitation: Provide vocational rehab assistance to qualifying candidates.   Vocational Rehab Evaluation & Intervention: Vocational Rehab - 12/28/18 1204      Initial Vocational Rehab Evaluation & Intervention   Assessment shows need for Vocational Rehabilitation  No       Education: Education Goals:  Education classes will be provided on a weekly basis, covering required topics. Participant will state understanding/return demonstration of topics presented.  Learning Barriers/Preferences: Learning Barriers/Preferences - 12/28/18 1134      Learning Barriers/Preferences   Learning Barriers  Language    Learning Preferences  Written Material       Education Topics: Hypertension, Hypertension Reduction -Define heart disease and high blood pressure. Discus how high blood pressure affects the body and ways to reduce high blood pressure.   Exercise and Your Heart -Discuss why it is important to exercise, the FITT principles of exercise, normal and abnormal responses to exercise, and how to exercise safely.   Angina -Discuss definition of angina, causes of angina, treatment of angina, and how to decrease risk of having angina.   Cardiac Medications -Review what the following cardiac medications are used for, how they affect the body, and side effects that may occur when taking the medications.  Medications include Aspirin, Beta blockers, calcium channel blockers, ACE Inhibitors, angiotensin receptor blockers, diuretics, digoxin, and antihyperlipidemics.   Congestive Heart Failure -Discuss the definition of CHF, how to live with CHF, the signs and symptoms of CHF, and how keep track of weight and sodium intake.   Heart Disease and Intimacy -Discus the effect sexual activity has on the heart, how changes occur during intimacy as we age, and safety during sexual activity.   Smoking Cessation / COPD -Discuss different methods to quit smoking, the health benefits of quitting smoking, and the definition of COPD.   Nutrition I: Fats -Discuss the types of cholesterol, what cholesterol does to the heart, and how cholesterol levels can be controlled.   Nutrition II: Labels -Discuss the different components of food labels and how to read food label   Heart Parts/Heart Disease and  PAD -Discuss the anatomy of the heart, the pathway of blood circulation through the heart, and these are affected by heart disease.   Stress I: Signs and Symptoms -Discuss the causes of stress, how stress may lead to anxiety and depression, and ways to limit stress.   Stress II: Relaxation -Discuss different types of relaxation techniques to limit stress.   Warning Signs of Stroke / TIA -Discuss definition of a stroke, what the signs and symptoms are of a stroke, and how to identify when someone is having stroke.   Knowledge Questionnaire Score: Knowledge Questionnaire Score - 12/28/18 1100  Knowledge Questionnaire Score   Pre Score  21/24       Core Components/Risk Factors/Patient Goals at Admission: Personal Goals and Risk Factors at Admission - 12/28/18 1205      Core Components/Risk Factors/Patient Goals on Admission   Intervention  Weight Management: Develop a combined nutrition and exercise program designed to reach desired caloric intake, while maintaining appropriate intake of nutrient and fiber, sodium and fats, and appropriate energy expenditure required for the weight goal.;Weight Management: Provide education and appropriate resources to help participant work on and attain dietary goals.    Admit Weight  127 lb 13.9 oz (58 kg)    Expected Outcomes  Weight Gain: Understanding of general recommendations for a high calorie, high protein meal plan that promotes weight gain by distributing calorie intake throughout the day with the consumption for 4-5 meals, snacks, and/or supplements;Understanding recommendations for meals to include 15-35% energy as protein, 25-35% energy from fat, 35-60% energy from carbohydrates, less than  of dietary cholesterol, 20-35 gm of total fiber daily;Understanding of distribution of calorie intake throughout the day with the consumption of 4-5 meals/snacks;Weight Maintenance: Understanding of the daily nutrition guidelines, which includes  25-35% calories from fat, 7% or less cal from saturated fats, less than  cholesterol, less than 1.5gm of sodium, & 5 or more servings of fruits and vegetables daily;Long Term: Adherence to nutrition and physical activity/exercise program aimed toward attainment of established weight goal;Short Term: Continue to assess and modify interventions until short term weight is achieved    Heart Failure  Yes    Intervention  --   provide education to patient and give educational resources as needed   Expected Outcomes  Improve functional capacity of life;Short term: Attendance in program 2-3 days a week with increased exercise capacity. Reported lower sodium intake. Reported increased fruit and vegetable intake. Reports medication compliance.;Short term: Daily weights obtained and reported for increase. Utilizing diuretic protocols set by physician.;Long term: Adoption of self-care skills and reduction of barriers for early signs and symptoms recognition and intervention leading to self-care maintenance.    Lipids  Yes    Intervention  Provide education and support for participant on nutrition & aerobic/resistive exercise along with prescribed medications to achieve LDL 70mg , HDL >40mg .    Expected Outcomes  Short Term: Participant states understanding of desired cholesterol values and is compliant with medications prescribed. Participant is following exercise prescription and nutrition guidelines.;Long Term: Cholesterol controlled with medications as prescribed, with individualized exercise RX and with personalized nutrition plan. Value goals: LDL < , HDL > 40 mg.    Stress  Yes    Intervention  Offer individual and/or small group education and counseling on adjustment to heart disease, stress management and health-related lifestyle change. Teach and support self-help strategies.    Expected Outcomes  Short Term: Participant demonstrates changes in health-related behavior, relaxation and other stress  management skills, ability to obtain effective social support, and compliance with psychotropic medications if prescribed.;Long Term: Emotional wellbeing is indicated by absence of clinically significant psychosocial distress or social isolation.       Core Components/Risk Factors/Patient Goals Review:  Goals and Risk Factor Review    Row Name 01/01/19 1040             Core Components/Risk Factors/Patient Goals Review   Personal Goals Review  Weight Management/Obesity;Heart Failure;Lipids;Stress       Review  Hades started exercise at cardiac rehab on 01/01/19 tolerated his first day of exercise without difficulty  Expected Outcomes  Revel has multiplr risk factors and is willing to participate in phase 2 cardiac rehab to assist in imporving risk factor modifications          Core Components/Risk Factors/Patient Goals at Discharge (Final Review):  Goals and Risk Factor Review - 01/01/19 1040      Core Components/Risk Factors/Patient Goals Review   Personal Goals Review  Weight Management/Obesity;Heart Failure;Lipids;Stress    Review  Earnestine started exercise at cardiac rehab on 01/01/19 tolerated his first day of exercise without difficulty    Expected Outcomes  Yvette has multiplr risk factors and is willing to participate in phase 2 cardiac rehab to assist in imporving risk factor modifications       ITP Comments: ITP Comments    Row Name 12/28/18 1025 01/02/19 1531         ITP Comments  Dr. Armanda Magic, Medical Director  30 Day ITP Review. Patient completed his first day of exercise at cardiac rehab without difficulty.         Comments: See ITP comments. Patient reminded to make sure to wear his life vest except for showering as he did not have his life vest on his first day of exercise.Gladstone Lighter, RN,BSN 01/04/2019 1:26 PM

## 2019-01-03 ENCOUNTER — Other Ambulatory Visit: Payer: Self-pay

## 2019-01-03 ENCOUNTER — Encounter (HOSPITAL_COMMUNITY)
Admission: RE | Admit: 2019-01-03 | Discharge: 2019-01-03 | Disposition: A | Discharge status: Home / Self Care | Payer: BC Managed Care – PPO | Source: Ambulatory Visit | Attending: Interventional Cardiology | Admitting: Interventional Cardiology

## 2019-01-03 DIAGNOSIS — I2102 ST elevation (STEMI) myocardial infarction involving left anterior descending coronary artery: Secondary | ICD-10-CM | POA: Diagnosis not present

## 2019-01-03 DIAGNOSIS — Z955 Presence of coronary angioplasty implant and graft: Secondary | ICD-10-CM

## 2019-01-05 ENCOUNTER — Encounter (HOSPITAL_COMMUNITY)
Admission: RE | Admit: 2019-01-05 | Discharge: 2019-01-05 | Disposition: A | Discharge status: Home / Self Care | Payer: BC Managed Care – PPO | Source: Ambulatory Visit | Attending: Interventional Cardiology | Admitting: Interventional Cardiology

## 2019-01-05 ENCOUNTER — Other Ambulatory Visit: Payer: Self-pay

## 2019-01-05 DIAGNOSIS — I2102 ST elevation (STEMI) myocardial infarction involving left anterior descending coronary artery: Secondary | ICD-10-CM

## 2019-01-05 DIAGNOSIS — Z955 Presence of coronary angioplasty implant and graft: Secondary | ICD-10-CM

## 2019-01-08 ENCOUNTER — Other Ambulatory Visit: Payer: Self-pay

## 2019-01-08 ENCOUNTER — Encounter (HOSPITAL_COMMUNITY)
Admission: RE | Admit: 2019-01-08 | Discharge: 2019-01-08 | Disposition: A | Discharge status: Home / Self Care | Payer: BC Managed Care – PPO | Source: Ambulatory Visit | Attending: Interventional Cardiology | Admitting: Interventional Cardiology

## 2019-01-08 DIAGNOSIS — Z955 Presence of coronary angioplasty implant and graft: Secondary | ICD-10-CM

## 2019-01-08 DIAGNOSIS — I2102 ST elevation (STEMI) myocardial infarction involving left anterior descending coronary artery: Secondary | ICD-10-CM | POA: Diagnosis not present

## 2019-01-10 ENCOUNTER — Encounter (HOSPITAL_COMMUNITY)
Admission: RE | Admit: 2019-01-10 | Discharge: 2019-01-10 | Disposition: A | Discharge status: Home / Self Care | Payer: BC Managed Care – PPO | Source: Ambulatory Visit | Attending: Interventional Cardiology | Admitting: Interventional Cardiology

## 2019-01-10 ENCOUNTER — Telehealth: Payer: Self-pay | Admitting: Interventional Cardiology

## 2019-01-10 ENCOUNTER — Other Ambulatory Visit: Payer: Self-pay

## 2019-01-10 DIAGNOSIS — Z955 Presence of coronary angioplasty implant and graft: Secondary | ICD-10-CM | POA: Diagnosis not present

## 2019-01-10 DIAGNOSIS — I2102 ST elevation (STEMI) myocardial infarction involving left anterior descending coronary artery: Secondary | ICD-10-CM | POA: Diagnosis not present

## 2019-01-10 NOTE — Telephone Encounter (Signed)
Spoke with Lattie Haw from [facility].  She states pt had LifeVest placed while in the hospital in July.  Pt coming up on 3 months of wearing the vest.  He was asking about extending the LifeVest until he finishes Cardiac Rehab at the beginning of Dec.  Advised I would have to send to Dr. Tamala Julian for review.    Pt was to have Cardiac MRI tomorrow but called and rescheduled to 01/30/2019.  Also currently on Entresto.

## 2019-01-10 NOTE — Progress Notes (Signed)
I have reviewed a Home Exercise Prescription with [name] . James [name] is currently exercising at home.  The patient was advised to walk 5-7 days a week for 30-45 minutes.  Demitris and I discussed how to progress their exercise prescription.  The patient stated that their goals were to walk 30 minutes two times per day 2-4 days per week in addition to CR program.  The patient stated that they understand the exercise prescription.  We reviewed exercise guidelines, target heart rate during exercise, RPE Scale, weather conditions, NTG use, endpoints for exercise, warmup and cool down.  Patient is encouraged to come to me with any questions. I will continue to follow up with the patient to assist them with progression and safety.    Deitra Mayo BS, ACSM CEP 01/10/2019 10:26 AM

## 2019-01-10 NOTE — Telephone Encounter (Signed)
Spoke with Lattie Haw and made her aware of information provided by Dr. Tamala Julian.  She will update their system.  Lattie Haw appreciative for call.

## 2019-01-10 NOTE — Telephone Encounter (Signed)
Lisa from [place] called. She is the patient's case Freight forwarder. The patient contacted [employer] in regards to a LifeVest monitor. The patient told Lattie Haw he was to wear this monitor for 3 months. The patient recently started Cardiac Rehab, and would like to have the orders extended to wear the vest for the duration of his cardiac rehab.  Lattie Haw could not determine where the initial order came from, so she reached out to [place] for help. The contact number provided is her direct number

## 2019-01-10 NOTE — Telephone Encounter (Signed)
We are waiting to get updated LifeVest. If EF still low he will need a defibrillator not continuing the LifeVest.

## 2019-01-10 NOTE — Progress Notes (Signed)
Nutrition Note  Spoke with patient today about lipid management. Reviewed lipid panel. Discussed diet changes for out of range values. Reviewed heart healthy diet. Daily recommendation of 25-40 g fiber, 12-16 g saturated fat, 0 g trans fat, and 1500 mg sodium. Recommended eating pattern of lean protein at every meal or snack, 2 servings fatty fish per week, 3-5 servings non starchy vegetables per day, 2-3 servings fruit per day, 1 oz nuts per day. Provided handouts and recipes. Collaborated with pt for goal setting. Pt verbalized understanding.   Michaele Offer, MS, RDN, LDN

## 2019-01-11 ENCOUNTER — Ambulatory Visit (HOSPITAL_COMMUNITY): Payer: BC Managed Care – PPO

## 2019-01-12 ENCOUNTER — Encounter (HOSPITAL_COMMUNITY): Payer: BC Managed Care – PPO

## 2019-01-15 ENCOUNTER — Other Ambulatory Visit: Payer: Self-pay

## 2019-01-15 ENCOUNTER — Encounter (HOSPITAL_COMMUNITY)
Admission: RE | Admit: 2019-01-15 | Discharge: 2019-01-15 | Disposition: A | Discharge status: Home / Self Care | Payer: BC Managed Care – PPO | Source: Ambulatory Visit | Attending: Interventional Cardiology | Admitting: Interventional Cardiology

## 2019-01-15 DIAGNOSIS — Z955 Presence of coronary angioplasty implant and graft: Secondary | ICD-10-CM

## 2019-01-15 DIAGNOSIS — I2102 ST elevation (STEMI) myocardial infarction involving left anterior descending coronary artery: Secondary | ICD-10-CM

## 2019-01-17 ENCOUNTER — Encounter (HOSPITAL_COMMUNITY)
Admission: RE | Admit: 2019-01-17 | Discharge: 2019-01-17 | Disposition: A | Discharge status: Home / Self Care | Payer: BC Managed Care – PPO | Source: Ambulatory Visit | Attending: Interventional Cardiology | Admitting: Interventional Cardiology

## 2019-01-17 ENCOUNTER — Other Ambulatory Visit: Payer: Self-pay

## 2019-01-17 DIAGNOSIS — Z955 Presence of coronary angioplasty implant and graft: Secondary | ICD-10-CM | POA: Diagnosis not present

## 2019-01-17 DIAGNOSIS — I2102 ST elevation (STEMI) myocardial infarction involving left anterior descending coronary artery: Secondary | ICD-10-CM | POA: Diagnosis not present

## 2019-01-19 ENCOUNTER — Encounter (HOSPITAL_COMMUNITY): Payer: BC Managed Care – PPO

## 2019-01-22 ENCOUNTER — Telehealth (HOSPITAL_COMMUNITY): Payer: Self-pay | Admitting: *Deleted

## 2019-01-22 ENCOUNTER — Encounter (HOSPITAL_COMMUNITY): Payer: BC Managed Care – PPO

## 2019-01-22 NOTE — Telephone Encounter (Signed)
Patient says he was out due to other obligations and plans to return to exercise on Wednesday.Barnet Pall, RN,BSN 01/22/2019 12:18 PM

## 2019-01-24 ENCOUNTER — Other Ambulatory Visit: Payer: Self-pay

## 2019-01-24 ENCOUNTER — Encounter (HOSPITAL_COMMUNITY)
Admission: RE | Admit: 2019-01-24 | Discharge: 2019-01-24 | Disposition: A | Discharge status: Home / Self Care | Payer: BC Managed Care – PPO | Source: Ambulatory Visit | Attending: Interventional Cardiology | Admitting: Interventional Cardiology

## 2019-01-24 DIAGNOSIS — Z955 Presence of coronary angioplasty implant and graft: Secondary | ICD-10-CM | POA: Insufficient documentation

## 2019-01-24 DIAGNOSIS — I2102 ST elevation (STEMI) myocardial infarction involving left anterior descending coronary artery: Secondary | ICD-10-CM | POA: Insufficient documentation

## 2019-01-26 ENCOUNTER — Other Ambulatory Visit: Payer: Self-pay

## 2019-01-26 ENCOUNTER — Encounter (HOSPITAL_COMMUNITY)
Admission: RE | Admit: 2019-01-26 | Discharge: 2019-01-26 | Disposition: A | Discharge status: Home / Self Care | Payer: BC Managed Care – PPO | Source: Ambulatory Visit | Attending: Interventional Cardiology | Admitting: Interventional Cardiology

## 2019-01-26 DIAGNOSIS — I2102 ST elevation (STEMI) myocardial infarction involving left anterior descending coronary artery: Secondary | ICD-10-CM | POA: Diagnosis not present

## 2019-01-26 DIAGNOSIS — Z955 Presence of coronary angioplasty implant and graft: Secondary | ICD-10-CM

## 2019-01-29 ENCOUNTER — Telehealth (HOSPITAL_COMMUNITY): Payer: Self-pay | Admitting: Emergency Medicine

## 2019-01-29 ENCOUNTER — Other Ambulatory Visit: Payer: Self-pay

## 2019-01-29 ENCOUNTER — Encounter (HOSPITAL_COMMUNITY)
Admission: RE | Admit: 2019-01-29 | Discharge: 2019-01-29 | Disposition: A | Discharge status: Home / Self Care | Payer: BC Managed Care – PPO | Source: Ambulatory Visit | Attending: Interventional Cardiology | Admitting: Interventional Cardiology

## 2019-01-29 DIAGNOSIS — Z955 Presence of coronary angioplasty implant and graft: Secondary | ICD-10-CM | POA: Diagnosis not present

## 2019-01-29 DIAGNOSIS — I2102 ST elevation (STEMI) myocardial infarction involving left anterior descending coronary artery: Secondary | ICD-10-CM

## 2019-01-29 NOTE — Telephone Encounter (Signed)
Reaching out to patient to offer assistance regarding upcoming cardiac imaging study; pt verbalizes understanding of appt date/time, parking situation and where to check in, and verified current allergies; name and call back number provided for further questions should they arise Cina Klumpp RN Navigator Cardiac Imaging [facility] [phone] office [phone] cell 

## 2019-01-30 ENCOUNTER — Ambulatory Visit (HOSPITAL_COMMUNITY)
Admission: RE | Admit: 2019-01-30 | Discharge: 2019-01-30 | Disposition: A | Discharge status: Home / Self Care | Payer: BC Managed Care – PPO | Source: Ambulatory Visit | Attending: Cardiology | Admitting: Cardiology

## 2019-01-30 DIAGNOSIS — Q245 Malformation of coronary vessels: Secondary | ICD-10-CM | POA: Diagnosis not present

## 2019-01-30 DIAGNOSIS — I255 Ischemic cardiomyopathy: Secondary | ICD-10-CM | POA: Diagnosis not present

## 2019-01-30 MED ORDER — GADOBUTROL 1 MMOL/ML IV SOLN
9.0000 mL | Freq: Once | INTRAVENOUS | Status: AC | PRN
  Administered 2019-01-30: 13:00:00 9 mL via INTRAVENOUS

## 2019-01-30 NOTE — Progress Notes (Signed)
Cardiac Individual Treatment Plan  Patient Details  Name: James [name] MRN: [id number] Date of Birth: 1980/06/13 Referring Provider:     [facility] from 12/28/2018 in [place]  Referring Provider  Dr. Tamala Julian      Initial Encounter Date:    CARDIAC REHAB PHASE II ORIENTATION from 12/28/2018 in [place]  Date  12/28/18      Visit Diagnosis: Acute ST elevation myocardial infarction (STEMI) involving left anterior descending (LAD) coronary artery ([place]) 10/12/18  Status post coronary artery stent placement S/P DES LAD 10/12/18  Patient's Home Medications on Admission:  Current Outpatient Medications:  .  aspirin 81 MG chewable tablet, Chew 1 tablet (81 mg total) by mouth daily., Disp: 90 tablet, Rfl: 1 .  metoprolol succinate (TOPROL-XL) 25 MG 24 hr tablet, Take 0.5 tablets (12.5 mg total) by mouth daily., Disp: 15 tablet, Rfl: 6 .  nitroGLYCERIN (NITROSTAT) 0.4 MG SL tablet, Place 1 tablet (0.4 mg total) under the tongue every 5 (five) minutes x 3 doses as needed for chest pain. (Patient not taking: Reported on 12/26/2018), Disp: 25 tablet, Rfl: 2 .  pantoprazole (PROTONIX) 40 MG tablet, Take 1 tablet (40 mg total) by mouth daily., Disp: 30 tablet, Rfl: 1 .  rosuvastatin (CRESTOR) 10 MG tablet, Take 1 tablet (10 mg total) by mouth daily. Do not start taking until 12/04/2018, Disp: 90 tablet, Rfl: 3 .  sacubitril-valsartan (ENTRESTO) 24-26 MG, Take 1 tablet by mouth 2 (two) times daily., Disp: 60 tablet, Rfl: 6 .  ticagrelor (BRILINTA) 90 MG TABS tablet, Take 1 tablet (90 mg total) by mouth 2 (two) times daily., Disp: 180 tablet, Rfl: 3  Past Medical History: Past Medical History:  Diagnosis Date  . Acute combined systolic and diastolic heart failure ([place]) 11/06/18  . Acute ST elevation myocardial infarction (STEMI) ([place]) 10/12/2018  . At risk for sudden cardiac death 11-06-18  .  Cardiomyopathy, ischemic 11/14/2018  . CHF (congestive heart failure) ([place])   . 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 Coronary artery disease involving native coronary artery of native heart with unstable angina pectoris ([place]) 11-06-18  . Delayed presentation of acute anterolateral STEMI 10/12/2018  . Hyperlipidemia    possible elevated triglycerides  . Ischemic cardiomyopathy    EF 30-35% // Echo 11/2018: Anteroseptal and anterior hypokinesis, EF 30-35, small circumferential pericardial effusion, no intracardiac thrombi   . Presence of drug coated stent in LAD coronary artery Nov 06, 2018   Ostial LAD DES PCI: Resolute Onyx 3.5 mm x 18 mm - 3.6 mm  . STEMI (ST elevation myocardial infarction) ([place]) 10/12/2018    Tobacco Use: Social History   Tobacco Use  Smoking Status Never Smoker  Smokeless Tobacco Never Used    Labs: Recent Review Flowsheet Data    Labs for ITP Cardiac and Pulmonary Rehab Latest Ref Rng & Units 11/13/2018 11/13/2018 11/13/2018 11/13/2018 12/01/2018   Cholestrol 100 - 199 mg/dL - - - - 157   LDLCALC 0 - 99 mg/dL - - - - 92   HDL >39 mg/dL - - - - 29(L)   Trlycerides 0 - 149 mg/dL - - - - 212(H)   Hemoglobin A1c 4.8 - 5.6 % - - - - -   HCO3 20.0 - 28.0 mmol/L 20.9 20.8 20.9 20.0 -   TCO2 22 - 32 mmol/L _0 21(L) -   ACIDBASEDEF 0.0 - 2.0 mmol/L 4.0(H) 4.0(H) 4.0(H)  5.0(H) -   O2SAT % 76.0 75.0 79.0 85.0 -      Capillary Blood Glucose: Lab Results  Component Value Date   GLUCAP 117 (H) 10/12/2018     Exercise Target Goals: Exercise Program Goal: Individual exercise prescription set using results from initial 6 min walk test and THRR while considering  patient's activity barriers and safety.   Exercise Prescription Goal: Starting with aerobic activity 30 plus minutes a day, 3 days per week for initial exercise prescription. Provide home exercise prescription and guidelines that participant acknowledges understanding prior to  discharge.  Activity Barriers & Risk Stratification: Activity Barriers & Cardiac Risk Stratification - 12/28/18 1130      Activity Barriers & Cardiac Risk Stratification   Activity Barriers  None    Cardiac Risk Stratification  High       6 Minute Walk: 6 Minute Walk    Row Name 12/28/18 1129         6 Minute Walk   Phase  Initial     Distance  1412 feet     Walk Time  6 minutes     # of Rest Breaks  0     MPH  2.6     METS  5.1     RPE  11     Perceived Dyspnea   0     VO2 Peak  18.04     Symptoms  No     Resting HR  90 bpm     Resting BP  92/60     Resting Oxygen Saturation   99 %     Exercise Oxygen Saturation  during 6 min walk  100 %     Max Ex. HR  102 bpm     Max Ex. BP  114/80     2 Minute Post BP  112/78        Oxygen Initial Assessment:   Oxygen Re-Evaluation:   Oxygen Discharge (Final Oxygen Re-Evaluation):   Initial Exercise Prescription: Initial Exercise Prescription - 12/28/18 1100      Date of Initial Exercise RX and Referring Provider   Date  12/28/18    Referring Provider  Dr. Tamala Julian    Expected Discharge Date  02/23/19      Recumbant Bike   Level  2    Watts  60    Minutes  15    METs  5.15      NuStep   Level  3    SPM  85    Minutes  15    METs  3      Prescription Details   Frequency (times per week)  3    Duration  Progress to 30 minutes of continuous aerobic without signs/symptoms of physical distress      Intensity   THRR 40-80% of Max Heartrate  73-146    Ratings of Perceived Exertion  11-13      Progression   Progression  Continue to progress workloads to maintain intensity without signs/symptoms of physical distress.      Resistance Training   Training Prescription  Yes    Weight  4 lbs.     Reps  10-15       Perform Capillary Blood Glucose checks as needed.  Exercise Prescription Changes:  Exercise Prescription Changes    Row Name 01/01/19 1100 01/10/19 1100 01/31/19 0915         Response to  Exercise   Blood Pressure (Admit)  88/54  102/76  100/62     Blood Pressure (Exercise)  118/80  120/68  120/64     Blood Pressure (Exit)  113/79  102/59  98/60     Heart Rate (Admit)  85 bpm  85 bpm  92 bpm     Heart Rate (Exercise)  127 bpm  130 bpm  126 bpm     Heart Rate (Exit)  95 bpm  93 bpm  95 bpm     Rating of Perceived Exertion (Exercise)  _0 Symptoms  None  None  None     Comments  Pt first day of CR program.   -  -     Duration  Continue with 30 min of aerobic exercise without signs/symptoms of physical distress.  Continue with 30 min of aerobic exercise without signs/symptoms of physical distress.  Continue with 30 min of aerobic exercise without signs/symptoms of physical distress.     Intensity  THRR unchanged  THRR unchanged  THRR unchanged       Progression   Progression  Continue to progress workloads to maintain intensity without signs/symptoms of physical distress.  Continue to progress workloads to maintain intensity without signs/symptoms of physical distress.  Continue to progress workloads to maintain intensity without signs/symptoms of physical distress.     Average METs  4.1  5  5.7       Resistance Training   Training Prescription  Yes  No  -     Weight  4 lbs.   -  -     Reps  10-15  -  -     Time  10 Minutes  -  -       Interval Training   Interval Training  No  No  No       Recumbant Bike   Level  _1 Watts  60  60  -     Minutes  _2 METs  5.3  7  7.4       NuStep   Level  _3 SPM  85  85  85     Minutes  _4 METs  3  3.1  4       Home Exercise Plan   Plans to continue exercise at  -  Home (comment)  Home (comment)     Frequency  -  Add 3 additional days to program exercise sessions.  Add 3 additional days to program exercise sessions.     Initial Home Exercises Provided  -  01/10/19  01/10/19        Exercise Comments:  Exercise Comments    Row Name 01/01/19 1149 01/10/19 1027 02/01/19 0856        Exercise Comments  Pt first day of CR program. Pt tolerated exercise Rx well.  Reviewed HEP with Pt. Pt understands goals and is exercising at home.  Reviewed METs and goals with Pt. Pt is progressing well.        Exercise Goals and Review:  Exercise Goals    Row Name 12/28/18 1132             Exercise Goals   Increase Physical Activity  Yes       Intervention  Provide advice, education, support and counseling about physical activity/exercise  needs.;Develop an individualized exercise prescription for aerobic and resistive training based on initial evaluation findings, risk stratification, comorbidities and participant's personal goals.       Expected Outcomes  Short Term: Attend rehab on a regular basis to increase amount of physical activity.;Long Term: Add in home exercise to make exercise part of routine and to increase amount of physical activity.;Long Term: Exercising regularly at least 3-5 days a week.       Increase Strength and Stamina  Yes       Intervention  Provide advice, education, support and counseling about physical activity/exercise needs.;Develop an individualized exercise prescription for aerobic and resistive training based on initial evaluation findings, risk stratification, comorbidities and participant's personal goals.       Expected Outcomes  Short Term: Increase workloads from initial exercise prescription for resistance, speed, and METs.;Short Term: Perform resistance training exercises routinely during rehab and add in resistance training at home;Long Term: Improve cardiorespiratory fitness, muscular endurance and strength as measured by increased METs and functional capacity (6MWT)       Able to understand and use rate of perceived exertion (RPE) scale  Yes       Intervention  Provide education and explanation on how to use RPE scale       Expected Outcomes  Short Term: Able to use RPE daily in rehab to express subjective intensity level;Long Term:  Able to  use RPE to guide intensity level when exercising independently       Knowledge and understanding of Target Heart Rate Range (THRR)  Yes       Intervention  Provide education and explanation of THRR including how the numbers were predicted and where they are located for reference       Expected Outcomes  Short Term: Able to state/look up THRR;Long Term: Able to use THRR to govern intensity when exercising independently;Short Term: Able to use daily as guideline for intensity in rehab       Able to check pulse independently  Yes       Intervention  Provide education and demonstration on how to check pulse in carotid and radial arteries.;Review the importance of being able to check your own pulse for safety during independent exercise       Expected Outcomes  Short Term: Able to explain why pulse checking is important during independent exercise;Long Term: Able to check pulse independently and accurately       Understanding of Exercise Prescription  Yes       Intervention  Provide education, explanation, and written materials on patient's individual exercise prescription       Expected Outcomes  Short Term: Able to explain program exercise prescription;Long Term: Able to explain home exercise prescription to exercise independently          Exercise Goals Re-Evaluation : Exercise Goals Re-Evaluation    Row Name 01/01/19 1147 01/10/19 1026 02/01/19 0855         Exercise Goal Re-Evaluation   Exercise Goals Review  Increase Physical Activity;Understanding of Exercise Prescription;Increase Strength and Stamina;Knowledge and understanding of Target Heart Rate Range (THRR);Able to understand and use rate of perceived exertion (RPE) scale  Increase Physical Activity;Increase Strength and Stamina;Able to understand and use rate of perceived exertion (RPE) scale;Knowledge and understanding of Target Heart Rate Range (THRR);Able to check pulse independently;Understanding of Exercise Prescription  Increase  Physical Activity;Increase Strength and Stamina;Able to understand and use rate of perceived exertion (RPE) scale;Knowledge and understanding of Target Heart Rate Range (THRR);Able  to check pulse independently;Understanding of Exercise Prescription     Comments  Pt first day of CR program. Pt tolerated exercise Rx well. Pt understadns THRR, RPE scale, and exercise Rx.  Reviewed HEP with Pt. Pt understands home exercise goals, THRR< RPE scale, weather precautions, end points of exercise, pulse counting, and warm up and cool down stretches. Pt is currently wlaking 30 minutes two times per day 2-4 days per week in addition to CR program.  Reviewed METs and goals with Pt. Pt is progressing well and has a MET level of 5.3. Pt continues to increase workloads. Pt is exercising at home 2-4 days per week for 30-45 minutes per day in addition to CR program.     Expected Outcomes  Will continue to monitor and progress Pt as tolerated.  Will continue to monitor and progress Pt as tolerated.  Will continue to monitor and proress Pt as tolerated.         Discharge Exercise Prescription (Final Exercise Prescription Changes): Exercise Prescription Changes - 01/31/19 0915      Response to Exercise   Blood Pressure (Admit)  100/62    Blood Pressure (Exercise)  120/64    Blood Pressure (Exit)  98/60    Heart Rate (Admit)  92 bpm    Heart Rate (Exercise)  126 bpm    Heart Rate (Exit)  95 bpm    Rating of Perceived Exertion (Exercise)  12    Symptoms  None    Duration  Continue with 30 min of aerobic exercise without signs/symptoms of physical distress.    Intensity  THRR unchanged      Progression   Progression  Continue to progress workloads to maintain intensity without signs/symptoms of physical distress.    Average METs  5.7      Interval Training   Interval Training  No      Recumbant Bike   Level  3    Minutes  15    METs  7.4      NuStep   Level  4    SPM  85    Minutes  15    METs  4       Home Exercise Plan   Plans to continue exercise at  Home (comment)    Frequency  Add 3 additional days to program exercise sessions.    Initial Home Exercises Provided  01/10/19       Nutrition:  Target Goals: Understanding of nutrition guidelines, daily intake of sodium <1571m, cholesterol <2075m calories 30% from fat and 7% or less from saturated fats, daily to have 5 or more servings of fruits and vegetables.  Biometrics: Pre Biometrics - 12/28/18 1133      Pre Biometrics   Height  _0  (1.676 m)    Weight  58 kg    Waist Circumference  30 inches    Hip Circumference  34 inches    Waist to Hip Ratio  0.88 %    BMI (Calculated)  20.65    Triceps Skinfold  18 mm    % Body Fat  20.2 %    Grip Strength  33 kg    Flexibility  11 in    Single Leg Stand  30 seconds        Nutrition Therapy Plan and Nutrition Goals:   Nutrition Assessments:   Nutrition Goals Re-Evaluation:   Nutrition Goals Discharge (Final Nutrition Goals Re-Evaluation):   Psychosocial: Target Goals: Acknowledge presence or absence of significant  depression and/or stress, maximize coping skills, provide positive support system. Participant is able to verbalize types and ability to use techniques and skills needed for reducing stress and depression.  Initial Review & Psychosocial Screening: Initial Psych Review & Screening - 12/28/18 1201      Initial Review   Current issues with  Current Stress Concerns    Source of Stress Concerns  Chronic Illness    Comments  patient currently on short term disability from his job. Wants to have time to recover fully from his heart attack before returning to work.      Family Dynamics   Good Support System?  Yes   Javone has his wife for support     Barriers   Psychosocial barriers to participate in program  The patient should benefit from training in stress management and relaxation.      Screening Interventions   Interventions  Encouraged to  exercise;Provide feedback about the scores to participant;To provide support and resources with identified psychosocial needs    Expected Outcomes  Short Term goal: Utilizing psychosocial counselor, staff and physician to assist with identification of specific Stressors or current issues interfering with healing process. Setting desired goal for each stressor or current issue identified.;Long Term Goal: Stressors or current issues are controlled or eliminated.;Short Term goal: Identification and review with participant of any Quality of Life or Depression concerns found by scoring the questionnaire.       Quality of Life Scores: Quality of Life - 12/28/18 1100      Quality of Life   Select  Quality of Life      Quality of Life Scores   Health/Function Pre  18.17 %    Socioeconomic Pre  25.64 %    Psych/Spiritual Pre  26.57 %    Family Pre  25.2 %    GLOBAL Pre  22.47 %      Scores of 19 and below usually indicate a poorer quality of life in these areas.  A difference of  2-3 points is a clinically meaningful difference.  A difference of 2-3 points in the total score of the Quality of Life Index has been associated with significant improvement in overall quality of life, self-image, physical symptoms, and general health in studies assessing change in quality of life.  PHQ-9: Recent Review Flowsheet Data    Depression screen [facility] 2/9 01/01/2019   Decreased Interest 0   Down, Depressed, Hopeless 0   PHQ - 2 Score 0     Interpretation of Total Score  Total Score Depression Severity:  1-4 = Minimal depression, 5-9 = Mild depression, 10-14 = Moderate depression, 15-19 = Moderately severe depression, 20-27 = Severe depression   Psychosocial Evaluation and Intervention:   Psychosocial Re-Evaluation: Psychosocial Re-Evaluation    [place] Name 01/30/19 1322             Psychosocial Re-Evaluation   Current issues with  Current Stress Concerns       Comments  Salih has not voiced any  increased stressors since he has been participating in phase 2 cardiac rehab.       Expected Outcomes  Will continue to monitor for stressors and offer support as needed       Interventions  Encouraged to attend Cardiac Rehabilitation for the exercise;Stress management education       Continue Psychosocial Services   No Follow up required       Comments  patient currently on short term disability from his job.  Wants to have time to recover fully from his heart attack before returning to work.         Initial Review   Source of Stress Concerns  Chronic Illness          Psychosocial Discharge (Final Psychosocial Re-Evaluation): Psychosocial Re-Evaluation - 01/30/19 1322      Psychosocial Re-Evaluation   Current issues with  Current Stress Concerns    Comments  Glyn has not voiced any increased stressors since he has been participating in phase 2 cardiac rehab.    Expected Outcomes  Will continue to monitor for stressors and offer support as needed    Interventions  Encouraged to attend Cardiac Rehabilitation for the exercise;Stress management education    Continue Psychosocial Services   No Follow up required    Comments  patient currently on short term disability from his job. Wants to have time to recover fully from his heart attack before returning to work.      Initial Review   Source of Stress Concerns  Chronic Illness       Vocational Rehabilitation: Provide vocational rehab assistance to qualifying candidates.   Vocational Rehab Evaluation & Intervention: Vocational Rehab - 12/28/18 1204      Initial Vocational Rehab Evaluation & Intervention   Assessment shows need for Vocational Rehabilitation  No       Education: Education Goals: Education classes will be provided on a weekly basis, covering required topics. Participant will state understanding/return demonstration of topics presented.  Learning Barriers/Preferences: Learning Barriers/Preferences - 12/28/18 1134       Learning Barriers/Preferences   Learning Barriers  Language    Learning Preferences  Written Material       Education Topics: Hypertension, Hypertension Reduction -Define heart disease and high blood pressure. Discus how high blood pressure affects the body and ways to reduce high blood pressure.   Exercise and Your Heart -Discuss why it is important to exercise, the FITT principles of exercise, normal and abnormal responses to exercise, and how to exercise safely.   Angina -Discuss definition of angina, causes of angina, treatment of angina, and how to decrease risk of having angina.   Cardiac Medications -Review what the following cardiac medications are used for, how they affect the body, and side effects that may occur when taking the medications.  Medications include Aspirin, Beta blockers, calcium channel blockers, ACE Inhibitors, angiotensin receptor blockers, diuretics, digoxin, and antihyperlipidemics.   Congestive Heart Failure -Discuss the definition of CHF, how to live with CHF, the signs and symptoms of CHF, and how keep track of weight and sodium intake.   Heart Disease and Intimacy -Discus the effect sexual activity has on the heart, how changes occur during intimacy as we age, and safety during sexual activity.   Smoking Cessation / COPD -Discuss different methods to quit smoking, the health benefits of quitting smoking, and the definition of COPD.   Nutrition I: Fats -Discuss the types of cholesterol, what cholesterol does to the heart, and how cholesterol levels can be controlled.   Nutrition II: Labels -Discuss the different components of food labels and how to read food label   Heart Parts/Heart Disease and PAD -Discuss the anatomy of the heart, the pathway of blood circulation through the heart, and these are affected by heart disease.   Stress I: Signs and Symptoms -Discuss the causes of stress, how stress may lead to anxiety and depression,  and ways to limit stress.   Stress II: Relaxation -Discuss different types  of relaxation techniques to limit stress.   Warning Signs of Stroke / TIA -Discuss definition of a stroke, what the signs and symptoms are of a stroke, and how to identify when someone is having stroke.   Knowledge Questionnaire Score: Knowledge Questionnaire Score - 12/28/18 1100      Knowledge Questionnaire Score   Pre Score  21/24       Core Components/Risk Factors/Patient Goals at Admission: Personal Goals and Risk Factors at Admission - 12/28/18 1205      Core Components/Risk Factors/Patient Goals on Admission   Intervention  Weight Management: Develop a combined nutrition and exercise program designed to reach desired caloric intake, while maintaining appropriate intake of nutrient and fiber, sodium and fats, and appropriate energy expenditure required for the weight goal.;Weight Management: Provide education and appropriate resources to help participant work on and attain dietary goals.    Admit Weight  127 lb 13.9 oz (58 kg)    Expected Outcomes  Weight Gain: Understanding of general recommendations for a high calorie, high protein meal plan that promotes weight gain by distributing calorie intake throughout the day with the consumption for 4-5 meals, snacks, and/or supplements;Understanding recommendations for meals to include 15-35% energy as protein, 25-35% energy from fat, 35-60% energy from carbohydrates, less than 222m of dietary cholesterol, 20-35 gm of total fiber daily;Understanding of distribution of calorie intake throughout the day with the consumption of 4-5 meals/snacks;Weight Maintenance: Understanding of the daily nutrition guidelines, which includes 25-35% calories from fat, 7% or less cal from saturated fats, less than 2070mcholesterol, less than 1.5gm of sodium, & 5 or more servings of fruits and vegetables daily;Long Term: Adherence to nutrition and physical activity/exercise program  aimed toward attainment of established weight goal;Short Term: Continue to assess and modify interventions until short term weight is achieved    Heart Failure  Yes    Intervention  --   provide education to patient and give educational resources as needed   Expected Outcomes  Improve functional capacity of life;Short term: Attendance in program 2-3 days a week with increased exercise capacity. Reported lower sodium intake. Reported increased fruit and vegetable intake. Reports medication compliance.;Short term: Daily weights obtained and reported for increase. Utilizing diuretic protocols set by physician.;Long term: Adoption of self-care skills and reduction of barriers for early signs and symptoms recognition and intervention leading to self-care maintenance.    Lipids  Yes    Intervention  Provide education and support for participant on nutrition & aerobic/resistive exercise along with prescribed medications to achieve LDL <7020mHDL >10m52m  Expected Outcomes  Short Term: Participant states understanding of desired cholesterol values and is compliant with medications prescribed. Participant is following exercise prescription and nutrition guidelines.;Long Term: Cholesterol controlled with medications as prescribed, with individualized exercise RX and with personalized nutrition plan. Value goals: LDL < 70mg59mL > 40 mg.    Stress  Yes    Intervention  Offer individual and/or small group education and counseling on adjustment to heart disease, stress management and health-related lifestyle change. Teach and support self-help strategies.    Expected Outcomes  Short Term: Participant demonstrates changes in health-related behavior, relaxation and other stress management skills, ability to obtain effective social support, and compliance with psychotropic medications if prescribed.;Long Term: Emotional wellbeing is indicated by absence of clinically significant psychosocial distress or social isolation.        Core Components/Risk Factors/Patient Goals Review:  Goals and Risk Factor Review    Row Name 01/01/19  1040 01/30/19 1458 01/31/19 1430         Core Components/Risk Factors/Patient Goals Review   Personal Goals Review  Weight Management/Obesity;Heart Failure;Lipids;Stress  Weight Management/Obesity;Heart Failure;Lipids;Stress  Weight Management/Obesity;Heart Failure;Lipids;Stress     Review  Brailen started exercise at cardiac rehab on 01/01/19 tolerated his first day of exercise without difficulty  -  Carey has been doing well with exercise and reports feeling stronger. Montoya continues to wear his lifevest. Some resting systolic blood pressures in the 90's has been asymptomatic     Expected Outcomes  Kale has multiplr risk factors and is willing to participate in phase 2 cardiac rehab to assist in imporving risk factor modifications  -  Shahil will continue to participate in phase 2 cardiac rehab for exercise, nutrtion and lifestyle modifications.        Core Components/Risk Factors/Patient Goals at Discharge (Final Review):  Goals and Risk Factor Review - 01/31/19 1430      Core Components/Risk Factors/Patient Goals Review   Personal Goals Review  Weight Management/Obesity;Heart Failure;Lipids;Stress    Review  Issam has been doing well with exercise and reports feeling stronger. Tigran continues to wear his lifevest. Some resting systolic blood pressures in the 90's has been asymptomatic    Expected Outcomes  Philip will continue to participate in phase 2 cardiac rehab for exercise, nutrtion and lifestyle modifications.       ITP Comments: ITP Comments    Row Name 12/28/18 1025 01/02/19 1531 01/30/19 1318       ITP Comments  Dr. Fransico Him, Medical Director  30 Day ITP Review. Patient completed his first day of exercise at cardiac rehab without difficulty.  30 Day ITP Review. Shizuo is with good participation and attendance in phase 2 cardiac rehab.        Comments: See ITP  comments.Barnet Pall, RN,BSN 02/01/2019 1:13 PM

## 2019-01-31 ENCOUNTER — Other Ambulatory Visit: Payer: Self-pay

## 2019-01-31 ENCOUNTER — Encounter (HOSPITAL_COMMUNITY)
Admission: RE | Admit: 2019-01-31 | Discharge: 2019-01-31 | Disposition: A | Discharge status: Home / Self Care | Payer: BC Managed Care – PPO | Source: Ambulatory Visit | Attending: Interventional Cardiology | Admitting: Interventional Cardiology

## 2019-01-31 DIAGNOSIS — I2102 ST elevation (STEMI) myocardial infarction involving left anterior descending coronary artery: Secondary | ICD-10-CM | POA: Diagnosis not present

## 2019-01-31 DIAGNOSIS — Z955 Presence of coronary angioplasty implant and graft: Secondary | ICD-10-CM

## 2019-02-02 ENCOUNTER — Encounter (HOSPITAL_COMMUNITY): Payer: BC Managed Care – PPO

## 2019-02-02 ENCOUNTER — Telehealth: Payer: Self-pay | Admitting: *Deleted

## 2019-02-02 DIAGNOSIS — I5022 Chronic systolic (congestive) heart failure: Secondary | ICD-10-CM

## 2019-02-02 DIAGNOSIS — I255 Ischemic cardiomyopathy: Secondary | ICD-10-CM

## 2019-02-02 NOTE — Telephone Encounter (Signed)
Spoke with pt and went over results and recommendations. Scheduled pt to come in 11/16 and see Dr. Tamala Julian. Pt verbalized understanding and was appreciative for call.

## 2019-02-02 NOTE — Telephone Encounter (Signed)
-----   Message from [name], MD sent at 02/01/2019  5:32 PM EST ----- Let the patient know he needs to be referred to Dr. Rayann Heman to discuss AICD A copy will be sent to Patient, No Pcp Per

## 2019-02-03 NOTE — Progress Notes (Signed)
Cardiology Office Note:    Date:  02/05/2019   ID:  James [name], DOB 03/30/1980, MRN [id number]  PCP:  Patient, No Pcp Per  Cardiologist:  Lesleigh NoeHenry W Didier Brandenburg III, MD   Referring MD: No ref. provider found   Chief Complaint  Patient presents with  . Coronary Artery Disease  . Congestive Heart Failure    History of Present Illness:    James De BurrsKumar Gens is a 38 y.o. 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  He is improving.  He is enjoying cardiac rehab.  Exertional tolerance is improving.  He still has atypical chest pain that has lessened over the months.  Because of persisting discomfort he underwent repeat cardiac catheterization in August that demonstrated widely patent stent.  He still has relatively low blood pressures.  The chest discomfort is not exertional.  He is compliant with his medications.  He still has occasional episodes of dizziness.  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  These could be a source of pain.  He denies orthopnea, PND, and heart failure symptoms.  He still has a life Vest but does not wear it 24/7.  He rarely uses it while at home.  He takes it to cardiac rehab with him.  A recent MRI demonstrated predominant scar involving the anterior and apical segments of the heart with greater than 70% nonviability.  Most recent EF was 34% by MRI which is similar to the 30 to 35% range estimated by echocardiography in July and again in September 2020.  He works in [employer].  He has not gone back to work since his myocardial infarction in July.  Past Medical History:  Diagnosis Date  . Acute combined systolic and diastolic heart failure (HCC) 10/13/2018  . Acute ST elevation myocardial infarction (STEMI) (HCC) 10/12/2018  . At risk for sudden cardiac death  10/13/2018  . Cardiomyopathy, ischemic 11/14/2018  . CHF (congestive heart failure) (HCC)   . 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 Coronary artery disease involving native coronary artery of native heart with unstable angina pectoris (HCC) 10/13/2018  . Delayed presentation of acute anterolateral STEMI 10/12/2018  . Hyperlipidemia    possible elevated triglycerides  . Ischemic cardiomyopathy    EF 30-35% // Echo 11/2018: Anteroseptal and anterior hypokinesis, EF 30-35, small circumferential pericardial effusion, no intracardiac thrombi   . Presence of drug coated stent in LAD coronary artery 10/13/2018   Ostial LAD DES PCI: Resolute Onyx 3.5 mm x 18 mm - 3.6 mm  . STEMI (ST elevation myocardial infarction) (HCC) 10/12/2018    Past Surgical History:  Procedure Laterality Date  . CARDIAC CATHETERIZATION    . CORONARY/GRAFT ACUTE MI REVASCULARIZATION N/A 10/12/2018   Procedure: CORONARY/GRAFT ACUTE MI REVASCULARIZATION;  Surgeon: Lyn RecordsSmith, Anden Bartolo W, MD;  [facility];  Service: Cardiovascular;  Laterality: N/A;  . LEFT HEART CATH AND CORONARY ANGIOGRAPHY N/A 10/12/2018   Procedure: LEFT HEART CATH AND CORONARY ANGIOGRAPHY;  Surgeon: Lyn RecordsSmith, Gates Jividen W, MD;  Location: [facility] INVASIVE CV LAB;  Service: Cardiovascular;  Laterality: N/A;  . RIGHT/LEFT HEART CATH AND CORONARY ANGIOGRAPHY N/A 11/13/2018   Procedure: RIGHT/LEFT HEART CATH AND CORONARY ANGIOGRAPHY;  Surgeon: Yvonne KendallEnd, Christopher, MD;  Location: [facility] INVASIVE CV LAB;  Service: Cardiovascular;  Laterality: N/A;  Current Medications: Current Meds  Medication Sig  . aspirin 81 MG chewable tablet Chew 1 tablet (81 mg total) by mouth daily.  . metoprolol succinate (TOPROL-XL) 25 MG 24 hr tablet Take 0.5 tablets (12.5 mg total) by mouth daily.  . nitroGLYCERIN (NITROSTAT) 0.4 MG SL tablet Place 1 tablet (0.4 mg total) under the tongue every 5 (five) minutes x 3 doses as needed for chest pain.  . pantoprazole (PROTONIX) 40  MG tablet Take 1 tablet (40 mg total) by mouth daily.  . rosuvastatin (CRESTOR) 10 MG tablet Take 1 tablet (10 mg total) by mouth daily. Do not start taking until 12/04/2018  . ticagrelor (BRILINTA) 90 MG TABS tablet Take 1 tablet (90 mg total) by mouth 2 (two) times daily.  . [DISCONTINUED] sacubitril-valsartan (ENTRESTO) 24-26 MG Take 1 tablet by mouth 2 (two) times daily.     Allergies:   Patient has no known allergies.   Social History   Socioeconomic History  . Marital status: Married    Spouse name: Not on file  . Number of children: Not on file  . Years of education: 37  . Highest education level: Not on file  Occupational History  . Occupation: Building control surveyor: [employer]   Social Needs  . Financial resource strain: Not hard at all  . Food insecurity    Worry: Never true    Inability: Never true  . Transportation needs    Medical: No    Non-medical: No  Tobacco Use  . Smoking status: Never Smoker  . Smokeless tobacco: Never Used  Substance and Sexual Activity  . Alcohol use: Yes    Alcohol/week: 2.0 standard drinks    Types: 2 Cans of beer per week    Comment: 2 packs/month   . Drug use: Never  . Sexual activity: Not on file  Lifestyle  . Physical activity    Days per week: 4 days    Minutes per session: 40 min  . Stress: Only a little  Relationships  . Social Herbalist on phone: Not on file    Gets together: Not on file    Attends religious service: Not on file    Active member of club or organization: Not on file    Attends meetings of clubs or organizations: Not on file    Relationship status: Not on file  Other Topics Concern  . Not on file  Social History Narrative   Patient lives with wife and small child     Family History: The patient's family history is negative for Heart disease.  ROS:   Please see the history of present illness.    Denies insomnia, peripheral edema, palpitations, and syncope.  All other systems reviewed and are  negative.  EKGs/Labs/Other Studies Reviewed:    The following studies were reviewed today:  CARDIAC MRI 01/30/19: IMPRESSION: 1. Normal LV size with EF 34%, wall motion abnormalities in LAD infarction pattern as noted above.  2.  Normal RV size and systolic function, EF 96%.  3. Dense LAD territory scar, this myocardium is unlikely to be viable.  EKG:  EKG sinus rhythm with Q waves V1 through V5.  This is the EKG from November 14, 2018.  Recent Labs: 10/13/2018: B Natriuretic Peptide 325.1; TSH 3.547 10/14/2018: Magnesium 2.1 11/12/2018: Platelets 213 11/13/2018: Hemoglobin 11.9 12/01/2018: ALT 14 12/29/2018: BUN 11; Creatinine, Ser 1.15; Potassium 4.5; Sodium 141  Recent Lipid Panel    Component Value Date/Time  CHOL 157 12/01/2018 0836   TRIG 212 (H) 12/01/2018 0836   HDL 29 (L) 12/01/2018 0836   CHOLHDL 5.4 (H) 12/01/2018 0836   CHOLHDL 4.6 10/13/2018 0317   VLDL 35 10/13/2018 0317   LDLCALC 92 12/01/2018 0836    Physical Exam:    VS:  BP 110/68   Pulse 78   Ht  (1.676 m)   Wt 126 lb (57.2 kg)   SpO2 100%   BMI 20.34 kg/m     Wt Readings from Last 3 Encounters:  02/05/19 126 lb (57.2 kg)  12/28/18 127 lb 13.9 oz (58 kg)  12/18/18 128 lb 12.8 oz (58.4 kg)     GEN: Young and healthy-appearing.  Wearing his LifeVest.. No acute distress HEENT: Normal NECK: No JVD. LYMPHATICS: No lymphadenopathy CARDIAC:  RRR without murmur, gallop, or edema. VASCULAR:  Normal Pulses. No bruits. RESPIRATORY:  Clear to auscultation without rales, wheezing or rhonchi  ABDOMEN: Soft, non-tender, non-distended, No pulsatile mass, MUSCULOSKELETAL: No deformity  SKIN: Warm and dry NEUROLOGIC:  Alert and oriented x 3 PSYCHIATRIC:  Normal affect   ASSESSMENT:    1. Ischemic cardiomyopathy   2. Chronic systolic CHF (congestive heart failure) (HCC)   3. Coronary artery disease involving native coronary artery of native heart without angina pectoris   4. Hyperlipidemia with  target LDL less than 70   5. Essential hypertension   6. Educated about COVID-19 virus infection    PLAN:    In order of problems listed above:  1. Please see the MRI report above.  Near full-thickness scar in the anterior and apical regions.  LVEF is 34%.  I have asked him to stop wearing the LifeVest.  He will consult with Dr. Hillis Range today.  This will give an opinion concerning the advisability of ICD implantation.  The patient nor I are wild about that idea.  We will move in the direction recommended by Dr. Johney Frame. 2. No evidence of volume overload.  Still attempting to optimize heart failure therapy.  We have enough blood pressure to attempt increasing Entresto to 49/51 mg twice daily.  We will repeat an echo again in early January. 3. Secondary prevention discussed and all measures are being adhered to. 4. LDL was 92, 2 months ago.  Rosuvastatin intensity should be increased to 20 mg/day. 5. Blood pressure is higher today.  Systolic is 124 mmHg.  With this, we will take the opportunity to attempt up titration of Entresto to 49/51 mg twice daily.  6-week follow-up with basic metabolic panel. 6. Handwashing, mask wearing, and social distancing is being practiced by the patient.  Guideline directed therapy for left ventricular systolic dysfunction: Angiotensin receptor-neprilysin inhibitor (ARNI)-Entresto; beta-blocker therapy - carvedilol or metoprolol succinate; mineralocorticoid receptor antagonist (MRA) therapy -spironolactone or eplerenone.  These therapies have been shown to improve clinical outcomes including reduction of rehospitalization survival, and acute heart failure.  Electrophysiology opinion from Dr. Johney Frame concerning ICD.  I have asked the patient to discontinue the LifeVest as he is wearing it less than 50% of the time.  It would not be safe for him to return to work until first of the year, perhaps January 4.  He needs to complete cardiac rehab.  We need to fully titrate  heart failure therapy.  We will also have made a final decision concerning defibrillator implantation by that time.   Medication Adjustments/Labs and Tests Ordered: Current medicines are reviewed at length with the patient today.  Concerns regarding medicines are  outlined above.  No orders of the defined types were placed in this encounter.  Meds ordered this encounter  Medications  . sacubitril-valsartan (ENTRESTO) 49-51 MG    Sig: Take 1 tablet by mouth 2 (two) times daily.    Dispense:  60 tablet    Refill:  11    Dose change    Patient Instructions  Medication Instructions:  1) INCREASE Entresto to 49/51 twice daily.  Start by taking 1 tablet of your 24/26 in the morning and 2 tablets in the evening.  After 3-5 days increase it by taking 2 tablets in the morning and 2 tablets in the evening.  If that dose is tolerable, pick up the new tablets and start taking those.   *If you need a refill on your cardiac medications before your next appointment, please call your pharmacy*  Lab Work: None If you have labs (blood work) drawn today and your tests are completely normal, you will receive your results only by: Marland Kitchen MyChart Message (if you have MyChart) OR . A paper copy in the mail If you have any lab test that is abnormal or we need to change your treatment, we will call you to review the results.  Testing/Procedures: None  Follow-Up: At [facility], you and your health needs are our priority.  As part of our continuing mission to provide you with exceptional heart care, we have created designated Provider Care Teams.  These Care Teams include your primary Cardiologist (physician) and Advanced Practice Providers (APPs -  Physician Assistants and Nurse Practitioners) who all work together to provide you with the care you need, when you need it.  Your next appointment:   4-6 weeks  The format for your next appointment:   In Person  Provider:   You may see Lesleigh Noe, MD  or one of the following Advanced Practice Providers on your designated Care Team:    Norma Fredrickson, NP  Nada Boozer, NP  Georgie Chard, NP   Other Instructions  You may discontinue your LifeVest.      Signed, Lesleigh Noe, MD  02/05/2019 1:00 PM    [facility]

## 2019-02-05 ENCOUNTER — Ambulatory Visit (INDEPENDENT_AMBULATORY_CARE_PROVIDER_SITE_OTHER): Payer: BC Managed Care – PPO | Admitting: Interventional Cardiology

## 2019-02-05 ENCOUNTER — Encounter: Payer: Self-pay | Admitting: Internal Medicine

## 2019-02-05 ENCOUNTER — Other Ambulatory Visit: Payer: Self-pay

## 2019-02-05 ENCOUNTER — Telehealth (INDEPENDENT_AMBULATORY_CARE_PROVIDER_SITE_OTHER): Payer: BC Managed Care – PPO | Admitting: Internal Medicine

## 2019-02-05 ENCOUNTER — Encounter: Payer: Self-pay | Admitting: Interventional Cardiology

## 2019-02-05 ENCOUNTER — Encounter (HOSPITAL_COMMUNITY)
Admission: RE | Admit: 2019-02-05 | Discharge: 2019-02-05 | Disposition: A | Discharge status: Home / Self Care | Payer: BC Managed Care – PPO | Source: Ambulatory Visit | Attending: Interventional Cardiology | Admitting: Interventional Cardiology

## 2019-02-05 ENCOUNTER — Encounter: Payer: Self-pay | Admitting: *Deleted

## 2019-02-05 VITALS — BP 110/68 | HR 78 | Ht 66.0 in | Wt 126.0 lb

## 2019-02-05 DIAGNOSIS — E785 Hyperlipidemia, unspecified: Secondary | ICD-10-CM

## 2019-02-05 DIAGNOSIS — Z955 Presence of coronary angioplasty implant and graft: Secondary | ICD-10-CM

## 2019-02-05 DIAGNOSIS — I255 Ischemic cardiomyopathy: Secondary | ICD-10-CM

## 2019-02-05 DIAGNOSIS — I5022 Chronic systolic (congestive) heart failure: Secondary | ICD-10-CM

## 2019-02-05 DIAGNOSIS — Z7189 Other specified counseling: Secondary | ICD-10-CM

## 2019-02-05 DIAGNOSIS — I2510 Atherosclerotic heart disease of native coronary artery without angina pectoris: Secondary | ICD-10-CM | POA: Diagnosis not present

## 2019-02-05 DIAGNOSIS — I10 Essential (primary) hypertension: Secondary | ICD-10-CM

## 2019-02-05 DIAGNOSIS — I2102 ST elevation (STEMI) myocardial infarction involving left anterior descending coronary artery: Secondary | ICD-10-CM

## 2019-02-05 MED ORDER — ENTRESTO 49-51 MG PO TABS: 1.0000 | ORAL_TABLET | Freq: Two times a day (BID) | ORAL | 11 refills | Status: DC

## 2019-02-05 NOTE — Patient Instructions (Signed)
Medication Instructions:  1) INCREASE Entresto to 49/51 twice daily.  Start by taking 1 tablet of your 24/26 in the morning and 2 tablets in the evening.  After 3-5 days increase it by taking 2 tablets in the morning and 2 tablets in the evening.  If that dose is tolerable, pick up the new tablets and start taking those.   *If you need a refill on your cardiac medications before your next appointment, please call your pharmacy*  Lab Work: None If you have labs (blood work) drawn today and your tests are completely normal, you will receive your results only by: Marland Kitchen MyChart Message (if you have MyChart) OR . A paper copy in the mail If you have any lab test that is abnormal or we need to change your treatment, we will call you to review the results.  Testing/Procedures: None  Follow-Up: At [facility], you and your health needs are our priority.  As part of our continuing mission to provide you with exceptional heart care, we have created designated Provider Care Teams.  These Care Teams include your primary Cardiologist (physician) and Advanced Practice Providers (APPs -  Physician Assistants and Nurse Practitioners) who all work together to provide you with the care you need, when you need it.  Your next appointment:   4-6 weeks  The format for your next appointment:   In Person  Provider:   You may see Sinclair Grooms, MD or one of the following Advanced Practice Providers on your designated Care Team:    Truitt Merle, NP  Cecilie Kicks, NP  Kathyrn Drown, NP   Other Instructions  You may discontinue your LifeVest.

## 2019-02-05 NOTE — Progress Notes (Signed)
Electrophysiology TeleHealth Note   Due to national recommendations of social distancing due to COVID 19, Audio  telehealth visit is felt to be most appropriate for this patient at this time.  See MyChart message from today for patient consent regarding telehealth for [facility].   Date:  02/05/2019   ID:  James [name], DOB 16-Sep-1980, MRN [id number]  Location: home  Provider location:  [facility] Evaluation Performed: New patient consult  PCP:  Patient, No Pcp Per  Cardiologist:  Lesleigh Noe, MD  Electrophysiologist:  None   Chief Complaint:  Ischemic CM  History of Present Illness:    James [name] is a 38 y.o. male who presents via audio conferencing for a telehealth visit today.   The patient is referred for new consultation regarding risk stratification of sudden death by Dr Katrinka Blazing.  The patient presented late with acute anterior MI in July for which he underwent revascularization with PCI to the LAD.  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  EF 34% after aggressively medical therapy with large anterior scar noted on MRI. He is actively participating in cardiac rehab.  He is wearing a lifevest intermittently.   He reports some chest pain with moderate activity.  Today, he denies symptoms of palpitations, shortness of breath, orthopnea, PND, lower extremity edema, claudication, dizziness, presyncope, syncope, bleeding, or neurologic sequela. The patient is tolerating medications without difficulties and is otherwise without complaint today.     Past Medical History:  Diagnosis Date  . Acute combined systolic and diastolic heart failure (HCC) 2018-10-16  . Acute ST elevation myocardial infarction (STEMI) (HCC) 10/12/2018  . At risk for sudden cardiac death 10/16/2018  . Cardiomyopathy, ischemic 11/14/2018  . CHF (congestive heart failure) (HCC)   . 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 Coronary artery  disease involving native coronary artery of native heart with unstable angina pectoris (HCC) 2018/10/16  . Delayed presentation of acute anterolateral STEMI 10/12/2018  . Hyperlipidemia    possible elevated triglycerides  . Ischemic cardiomyopathy    EF 30-35% // Echo 11/2018: Anteroseptal and anterior hypokinesis, EF 30-35, small circumferential pericardial effusion, no intracardiac thrombi   . Presence of drug coated stent in LAD coronary artery October 16, 2018   Ostial LAD DES PCI: Resolute Onyx 3.5 mm x 18 mm - 3.6 mm  . STEMI (ST elevation myocardial infarction) (HCC) 10/12/2018    Past Surgical History:  Procedure Laterality Date  . CARDIAC CATHETERIZATION    . CORONARY/GRAFT ACUTE MI REVASCULARIZATION N/A 10/12/2018   Procedure: CORONARY/GRAFT ACUTE MI REVASCULARIZATION;  Surgeon: Lyn Records, MD;  Location: [facility] INVASIVE CV LAB;  Service: Cardiovascular;  Laterality: N/A;  . LEFT HEART CATH AND CORONARY ANGIOGRAPHY N/A 10/12/2018   Procedure: LEFT HEART CATH AND CORONARY ANGIOGRAPHY;  Surgeon: Lyn Records, MD;  Location: [facility] INVASIVE CV LAB;  Service: Cardiovascular;  Laterality: N/A;  . RIGHT/LEFT HEART CATH AND CORONARY ANGIOGRAPHY N/A 11/13/2018   Procedure: RIGHT/LEFT HEART CATH AND CORONARY ANGIOGRAPHY;  Surgeon: Yvonne Kendall, MD;  Location: [facility] INVASIVE CV LAB;  Service: Cardiovascular;  Laterality: N/A;    Current Outpatient Medications  Medication Sig Dispense Refill  . aspirin 81 MG chewable tablet Chew 1 tablet (81 mg total) by mouth daily. 90 tablet 1  . metoprolol succinate (TOPROL-XL) 25 MG 24 hr tablet Take 0.5 tablets (12.5 mg total) by mouth daily. 15 tablet 6  . nitroGLYCERIN (NITROSTAT)  0.4 MG SL tablet Place 1 tablet (0.4 mg total) under the tongue every 5 (five) minutes x 3 doses as needed for chest pain. 25 tablet 2  . pantoprazole (PROTONIX) 40 MG tablet Take 1 tablet (40 mg total) by mouth daily. 30 tablet 1  . rosuvastatin (CRESTOR) 10 MG tablet Take 1 tablet (10  mg total) by mouth daily. Do not start taking until 12/04/2018 90 tablet 3  . sacubitril-valsartan (ENTRESTO) 49-51 MG Take 1 tablet by mouth 3 (three) times daily. I tablet in morning and 2 tablets in the evening    . ticagrelor (BRILINTA) 90 MG TABS tablet Take 1 tablet (90 mg total) by mouth 2 (two) times daily. 180 tablet 3   No current facility-administered medications for this visit.     Allergies:   Patient has no known allergies.   Social History:  The patient  reports that he has never smoked. He has never used smokeless tobacco. He reports current alcohol use of about 2.0 standard drinks of alcohol per week. He reports that he does not use drugs.   Family History:  The patient's  family history includes Diabetes in his brother; Thyroid disease in his brother and mother.    ROS:  Please see the history of present illness.   All other systems are personally reviewed and negative.    Exam:    Vital Signs:  BP 110/68   Pulse 78   Ht 5\' 6"  (1.676 m)   Wt 126 lb (57.2 kg)   BMI 20.34 kg/m    Well sounding, alert and conversant    Labs/Other Tests and Data Reviewed:    Recent Labs: 10/13/2018: B Natriuretic Peptide 325.1; TSH 3.547 10/14/2018: Magnesium 2.1 11/12/2018: Platelets 213 11/13/2018: Hemoglobin 11.9 12/01/2018: ALT 14 12/29/2018: BUN 11; Creatinine, Ser 1.15; Potassium 4.5; Sodium 141   Wt Readings from Last 3 Encounters:  02/05/19 126 lb (57.2 kg)  02/05/19 126 lb (57.2 kg)  12/28/18 127 lb 13.9 oz (58 kg)     Other studies personally reviewed: Additional studies/ records that were reviewed today include: Dr Michaelle CopasSmith's notes,  prior MRI, prior caths   ASSESSMENT & PLAN:    1. The patient has an ischemic CM (EF 34%), NYHA Class III CHF, and CAD.  He is referred by Dr Katrinka BlazingSmith for risk stratification of sudden death and consideration of ICD implantation.  At this time, he meets MADIT II/ SCD-HeFT criteria for ICD implantation for primary prevention of sudden death.   Given QRS < 130 msec, he does not meet criteria for CRT.   I have had a thorough discussion with the patient reviewing options.  The patient has had opportunities to ask questions and have them answered.     Risks, benefits, alternatives to single chamber ICD implantation were discussed in detail with the patient today.    At this time, he would prefer to continue medicine optimization with Dr Katrinka BlazingSmith.  He states that he will follow up with Dr Katrinka BlazingSmith in 5 weeks and then reconsider ICD at that time.  The patient and Dr Katrinka BlazingSmith have decided to discontinue lifevest at this time.  If the patient decides to proceed with ICD, it would be nice if possible to switch ticagrelor to plavix to reduce bleeding risks if OK with Dr Katrinka BlazingSmith.   Follow-up:  With Dr Katrinka BlazingSmith as scheduled I will see as needed  Current medicines are reviewed at length with the patient today.   The patient does not have concerns  regarding his medicines.  The following changes were made today:  none  Labs/ tests ordered today include:  No orders of the defined types were placed in this encounter.   Patient Risk:  after full review of this patients clinical status, I feel that they are at moderate risk at this time.   Today, I have spent 10 minutes with the patient with telehealth technology discussing risk stratification of sudden death .    SignedThompson Grayer MD, [facility] 02/05/2019 3:09 PM   [place] [address] [phone] (office) [phone] (fax)

## 2019-02-06 ENCOUNTER — Telehealth: Payer: Self-pay | Admitting: Interventional Cardiology

## 2019-02-06 NOTE — Telephone Encounter (Signed)
Returned call to [place] with [employer].  Left detailed VM.  Advised to fax request to office.  Unable to determine what she is calling about by message taken.  Advised to call with further information or fax to office.  Fax # and phone # provided.

## 2019-02-06 NOTE — Telephone Encounter (Signed)
Ann, calling from [employer].  She states patient was advised by Dr. Tamala Julian to contact you for any forms or record request he may need.  Lelon Frohlich can be reach [phone] ext [phone].

## 2019-02-07 ENCOUNTER — Other Ambulatory Visit: Payer: Self-pay

## 2019-02-07 ENCOUNTER — Telehealth: Payer: Self-pay | Admitting: Interventional Cardiology

## 2019-02-07 ENCOUNTER — Encounter (HOSPITAL_COMMUNITY)
Admission: RE | Admit: 2019-02-07 | Discharge: 2019-02-07 | Disposition: A | Discharge status: Home / Self Care | Payer: BC Managed Care – PPO | Source: Ambulatory Visit | Attending: Interventional Cardiology | Admitting: Interventional Cardiology

## 2019-02-07 DIAGNOSIS — I2102 ST elevation (STEMI) myocardial infarction involving left anterior descending coronary artery: Secondary | ICD-10-CM | POA: Diagnosis not present

## 2019-02-07 DIAGNOSIS — Z955 Presence of coronary angioplasty implant and graft: Secondary | ICD-10-CM

## 2019-02-07 NOTE — Telephone Encounter (Signed)
STD disability form received. Sending to [employer] at [place] bldg. 02/07/19 vlm

## 2019-02-09 ENCOUNTER — Other Ambulatory Visit: Payer: Self-pay

## 2019-02-09 ENCOUNTER — Encounter (HOSPITAL_COMMUNITY)
Admission: RE | Admit: 2019-02-09 | Discharge: 2019-02-09 | Disposition: A | Discharge status: Home / Self Care | Payer: BC Managed Care – PPO | Source: Ambulatory Visit | Attending: Interventional Cardiology | Admitting: Interventional Cardiology

## 2019-02-09 DIAGNOSIS — I2102 ST elevation (STEMI) myocardial infarction involving left anterior descending coronary artery: Secondary | ICD-10-CM | POA: Diagnosis not present

## 2019-02-09 DIAGNOSIS — Z955 Presence of coronary angioplasty implant and graft: Secondary | ICD-10-CM | POA: Diagnosis not present

## 2019-02-12 ENCOUNTER — Encounter (HOSPITAL_COMMUNITY): Payer: BC Managed Care – PPO

## 2019-02-13 ENCOUNTER — Other Ambulatory Visit: Payer: Self-pay | Admitting: Cardiology

## 2019-02-13 MED ORDER — PANTOPRAZOLE SODIUM 40 MG PO TBEC: 40.0000 mg | DELAYED_RELEASE_TABLET | Freq: Every day | ORAL | 3 refills | Status: DC

## 2019-02-14 ENCOUNTER — Encounter (HOSPITAL_COMMUNITY)
Admission: RE | Admit: 2019-02-14 | Discharge: 2019-02-14 | Disposition: A | Discharge status: Home / Self Care | Payer: BC Managed Care – PPO | Source: Ambulatory Visit | Attending: Interventional Cardiology | Admitting: Interventional Cardiology

## 2019-02-14 ENCOUNTER — Other Ambulatory Visit: Payer: Self-pay

## 2019-02-14 DIAGNOSIS — Z955 Presence of coronary angioplasty implant and graft: Secondary | ICD-10-CM | POA: Diagnosis not present

## 2019-02-14 DIAGNOSIS — I2102 ST elevation (STEMI) myocardial infarction involving left anterior descending coronary artery: Secondary | ICD-10-CM | POA: Diagnosis not present

## 2019-02-16 ENCOUNTER — Encounter (HOSPITAL_COMMUNITY): Payer: BC Managed Care – PPO

## 2019-02-19 ENCOUNTER — Other Ambulatory Visit: Payer: Self-pay | Admitting: *Deleted

## 2019-02-19 ENCOUNTER — Encounter (HOSPITAL_COMMUNITY): Payer: BC Managed Care – PPO

## 2019-02-19 DIAGNOSIS — E785 Hyperlipidemia, unspecified: Secondary | ICD-10-CM

## 2019-02-19 DIAGNOSIS — I5022 Chronic systolic (congestive) heart failure: Secondary | ICD-10-CM

## 2019-02-19 DIAGNOSIS — I10 Essential (primary) hypertension: Secondary | ICD-10-CM

## 2019-02-20 ENCOUNTER — Other Ambulatory Visit: Payer: Self-pay

## 2019-02-20 ENCOUNTER — Other Ambulatory Visit (INDEPENDENT_AMBULATORY_CARE_PROVIDER_SITE_OTHER): Payer: BC Managed Care – PPO

## 2019-02-20 DIAGNOSIS — E785 Hyperlipidemia, unspecified: Secondary | ICD-10-CM | POA: Diagnosis not present

## 2019-02-20 DIAGNOSIS — I5022 Chronic systolic (congestive) heart failure: Secondary | ICD-10-CM | POA: Diagnosis not present

## 2019-02-20 DIAGNOSIS — I10 Essential (primary) hypertension: Secondary | ICD-10-CM | POA: Diagnosis not present

## 2019-02-20 LAB — HEPATIC FUNCTION PANEL
ALT: 12 IU/L (ref 0–44)
AST: 17 IU/L (ref 0–40)
Albumin: 4.5 g/dL (ref 4.0–5.0)
Alkaline Phosphatase: 73 IU/L (ref 39–117)
Bilirubin Total: 0.7 mg/dL (ref 0.0–1.2)
Bilirubin, Direct: 0.16 mg/dL (ref 0.00–0.40)
Total Protein: 6.8 g/dL (ref 6.0–8.5)

## 2019-02-20 LAB — LIPID PANEL
Chol/HDL Ratio: 4.8 ratio (ref 0.0–5.0)
Cholesterol, Total: 159 mg/dL (ref 100–199)
HDL: 33 mg/dL — ABNORMAL LOW (ref >39)
LDL Chol Calc (NIH): 90 mg/dL (ref 0–99)
Triglycerides: 214 mg/dL — ABNORMAL HIGH (ref 0–149)
VLDL Cholesterol Cal: 36 mg/dL (ref 5–40)

## 2019-02-21 ENCOUNTER — Encounter (HOSPITAL_COMMUNITY)
Admission: RE | Admit: 2019-02-21 | Discharge: 2019-02-21 | Disposition: A | Discharge status: Home / Self Care | Payer: BC Managed Care – PPO | Source: Ambulatory Visit | Attending: Interventional Cardiology | Admitting: Interventional Cardiology

## 2019-02-21 ENCOUNTER — Other Ambulatory Visit: Payer: Self-pay | Admitting: *Deleted

## 2019-02-21 DIAGNOSIS — I2102 ST elevation (STEMI) myocardial infarction involving left anterior descending coronary artery: Secondary | ICD-10-CM | POA: Diagnosis not present

## 2019-02-21 DIAGNOSIS — E785 Hyperlipidemia, unspecified: Secondary | ICD-10-CM

## 2019-02-21 DIAGNOSIS — Z955 Presence of coronary angioplasty implant and graft: Secondary | ICD-10-CM | POA: Insufficient documentation

## 2019-02-21 MED ORDER — ROSUVASTATIN CALCIUM 20 MG PO TABS: 20.0000 mg | ORAL_TABLET | Freq: Every day | ORAL | 3 refills | Status: DC

## 2019-02-23 ENCOUNTER — Encounter (HOSPITAL_COMMUNITY)
Admission: RE | Admit: 2019-02-23 | Discharge: 2019-02-23 | Disposition: A | Discharge status: Home / Self Care | Payer: BC Managed Care – PPO | Source: Ambulatory Visit | Attending: Interventional Cardiology | Admitting: Interventional Cardiology

## 2019-02-23 ENCOUNTER — Other Ambulatory Visit: Payer: Self-pay

## 2019-02-23 DIAGNOSIS — I2102 ST elevation (STEMI) myocardial infarction involving left anterior descending coronary artery: Secondary | ICD-10-CM | POA: Diagnosis not present

## 2019-02-23 DIAGNOSIS — Z955 Presence of coronary angioplasty implant and graft: Secondary | ICD-10-CM

## 2019-02-26 ENCOUNTER — Encounter (HOSPITAL_COMMUNITY)
Admission: RE | Admit: 2019-02-26 | Discharge: 2019-02-26 | Disposition: A | Discharge status: Home / Self Care | Payer: BC Managed Care – PPO | Source: Ambulatory Visit | Attending: Interventional Cardiology | Admitting: Interventional Cardiology

## 2019-02-26 ENCOUNTER — Other Ambulatory Visit: Payer: Self-pay

## 2019-02-26 DIAGNOSIS — I2102 ST elevation (STEMI) myocardial infarction involving left anterior descending coronary artery: Secondary | ICD-10-CM | POA: Diagnosis not present

## 2019-02-26 DIAGNOSIS — Z955 Presence of coronary angioplasty implant and graft: Secondary | ICD-10-CM

## 2019-02-28 ENCOUNTER — Encounter (HOSPITAL_COMMUNITY): Payer: BC Managed Care – PPO

## 2019-03-01 NOTE — Progress Notes (Signed)
Cardiac Individual Treatment Plan  Patient Details  Name: James [name] MRN: [id number] Date of Birth: August 30, 1980 Referring Provider:     [facility] from 12/28/2018 in [place]  Referring Provider  Dr. Tamala Julian      Initial Encounter Date:    CARDIAC REHAB PHASE II ORIENTATION from 12/28/2018 in [place]  Date  12/28/18      Visit Diagnosis: Acute ST elevation myocardial infarction (STEMI) involving left anterior descending (LAD) coronary artery ([place]) 10/12/18  Status post coronary artery stent placement S/P DES LAD 10/12/18  Patient's Home Medications on Admission:  Current Outpatient Medications:  .  aspirin 81 MG chewable tablet, Chew 1 tablet (81 mg total) by mouth daily., Disp: 90 tablet, Rfl: 1 .  metoprolol succinate (TOPROL-XL) 25 MG 24 hr tablet, Take 0.5 tablets (12.5 mg total) by mouth daily., Disp: 15 tablet, Rfl: 6 .  nitroGLYCERIN (NITROSTAT) 0.4 MG SL tablet, Place 1 tablet (0.4 mg total) under the tongue every 5 (five) minutes x 3 doses as needed for chest pain., Disp: 25 tablet, Rfl: 2 .  pantoprazole (PROTONIX) 40 MG tablet, Take 1 tablet (40 mg total) by mouth daily., Disp: 90 tablet, Rfl: 3 .  rosuvastatin (CRESTOR) 20 MG tablet, Take 1 tablet (20 mg total) by mouth daily., Disp: 90 tablet, Rfl: 3 .  sacubitril-valsartan (ENTRESTO) 49-51 MG, Take 1 tablet by mouth 3 (three) times daily. I tablet in morning and 2 tablets in the evening, Disp: , Rfl:  .  ticagrelor (BRILINTA) 90 MG TABS tablet, Take 1 tablet (90 mg total) by mouth 2 (two) times daily., Disp: 180 tablet, Rfl: 3  Past Medical History: Past Medical History:  Diagnosis Date  . Acute combined systolic and diastolic heart failure ([place]) Nov 10, 2018  . Acute ST elevation myocardial infarction (STEMI) ([place]) 10/12/2018  . At risk for sudden cardiac death 11-10-2018  . Cardiomyopathy, ischemic 11/14/2018  . CHF  (congestive heart failure) ([place])   . 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 Coronary artery disease involving native coronary artery of native heart with unstable angina pectoris ([place]) 11-10-18  . Delayed presentation of acute anterolateral STEMI 10/12/2018  . Hyperlipidemia    possible elevated triglycerides  . Ischemic cardiomyopathy    EF 30-35% // Echo 11/2018: Anteroseptal and anterior hypokinesis, EF 30-35, small circumferential pericardial effusion, no intracardiac thrombi   . Presence of drug coated stent in LAD coronary artery 11/10/18   Ostial LAD DES PCI: Resolute Onyx 3.5 mm x 18 mm - 3.6 mm  . STEMI (ST elevation myocardial infarction) ([place]) 10/12/2018    Tobacco Use: Social History   Tobacco Use  Smoking Status Never Smoker  Smokeless Tobacco Never Used    Labs: Recent Review Flowsheet Data    Labs for ITP Cardiac and Pulmonary Rehab Latest Ref Rng & Units 11/13/2018 11/13/2018 11/13/2018 12/01/2018 02/20/2019   Cholestrol 100 - 199 mg/dL - - - 157 159   LDLCALC 0 - 99 mg/dL - - - 92 90   HDL >39 mg/dL - - - 29(L) 33(L)   Trlycerides 0 - 149 mg/dL - - - 212(H) 214(H)   Hemoglobin A1c 4.8 - 5.6 % - - - - -   HCO3 20.0 - 28.0 mmol/L 20.8 20.9 20.0 - -   TCO2 22 - 32 mmol/L 22 22 21(L) - -   ACIDBASEDEF 0.0 - 2.0 mmol/L 4.0(H) 4.0(H) 5.0(H) - -  O2SAT % 75.0 79.0 85.0 - -      Capillary Blood Glucose: Lab Results  Component Value Date   GLUCAP 117 (H) 10/12/2018     Exercise Target Goals: Exercise Program Goal: Individual exercise prescription set using results from initial 6 min walk test and THRR while considering  patient's activity barriers and safety.   Exercise Prescription Goal: Starting with aerobic activity 30 plus minutes a day, 3 days per week for initial exercise prescription. Provide home exercise prescription and guidelines that participant acknowledges understanding prior to discharge.  Activity Barriers & Risk  Stratification: Activity Barriers & Cardiac Risk Stratification - 12/28/18 1130      Activity Barriers & Cardiac Risk Stratification   Activity Barriers  None    Cardiac Risk Stratification  High       6 Minute Walk: 6 Minute Walk    Row Name 12/28/18 1129         6 Minute Walk   Phase  Initial     Distance  1412 feet     Walk Time  6 minutes     # of Rest Breaks  0     MPH  2.6     METS  5.1     RPE  11     Perceived Dyspnea   0     VO2 Peak  18.04     Symptoms  No     Resting HR  90 bpm     Resting BP  92/60     Resting Oxygen Saturation   99 %     Exercise Oxygen Saturation  during 6 min walk  100 %     Max Ex. HR  102 bpm     Max Ex. BP  114/80     2 Minute Post BP  112/78        Oxygen Initial Assessment:   Oxygen Re-Evaluation:   Oxygen Discharge (Final Oxygen Re-Evaluation):   Initial Exercise Prescription: Initial Exercise Prescription - 12/28/18 1100      Date of Initial Exercise RX and Referring Provider   Date  12/28/18    Referring Provider  Dr. Tamala Julian    Expected Discharge Date  02/23/19      Recumbant Bike   Level  2    Watts  60    Minutes  15    METs  5.15      NuStep   Level  3    SPM  85    Minutes  15    METs  3      Prescription Details   Frequency (times per week)  3    Duration  Progress to 30 minutes of continuous aerobic without signs/symptoms of physical distress      Intensity   THRR 40-80% of Max Heartrate  73-146    Ratings of Perceived Exertion  11-13      Progression   Progression  Continue to progress workloads to maintain intensity without signs/symptoms of physical distress.      Resistance Training   Training Prescription  Yes    Weight  4 lbs.     Reps  10-15       Perform Capillary Blood Glucose checks as needed.  Exercise Prescription Changes:  Exercise Prescription Changes    Row Name 01/01/19 1100 01/10/19 1100 01/31/19 0915 02/23/19 1100       Response to Exercise   Blood Pressure  (Admit)  88/54  102/76  100/62  98/60    Blood Pressure (Exercise)  118/80  120/68  120/64  128/64    Blood Pressure (Exit)  113/79  102/59  98/60  110/60    Heart Rate (Admit)  85 bpm  85 bpm  92 bpm  93 bpm    Heart Rate (Exercise)  127 bpm  130 bpm  126 bpm  145 bpm    Heart Rate (Exit)  95 bpm  93 bpm  95 bpm  97 bpm    Rating of Perceived Exertion (Exercise)  _0 Symptoms  None  None  None  None    Comments  Pt first day of CR program.   --  --  --    Duration  Continue with 30 min of aerobic exercise without signs/symptoms of physical distress.  Continue with 30 min of aerobic exercise without signs/symptoms of physical distress.  Continue with 30 min of aerobic exercise without signs/symptoms of physical distress.  Continue with 30 min of aerobic exercise without signs/symptoms of physical distress.    Intensity  THRR unchanged  THRR unchanged  THRR unchanged  THRR unchanged      Progression   Progression  Continue to progress workloads to maintain intensity without signs/symptoms of physical distress.  Continue to progress workloads to maintain intensity without signs/symptoms of physical distress.  Continue to progress workloads to maintain intensity without signs/symptoms of physical distress.  Continue to progress workloads to maintain intensity without signs/symptoms of physical distress.    Average METs  4.1  5  5.7  4.5      Resistance Training   Training Prescription  Yes  No  --  Yes    Weight  4 lbs.   --  --  4 lbs.     Reps  10-15  --  --  10-15    Time  10 Minutes  --  --  10 Minutes      Interval Training   Interval Training  No  No  No  No      Treadmill   MPH  --  --  --  3.3    Grade  --  --  --  1    Minutes  --  --  --  15    METs  --  --  --  3.98      Recumbant Bike   Level  _1 --    Watts  60  60  --  --    Minutes  _2 --    METs  5.3  7  7.4  --      NuStep   Level  _3 SPM  85  85  85  85    Minutes  _4 METs  3  3.1  4  5.1      Home Exercise Plan   Plans to continue exercise at  --  Home (comment)  Home (comment)  Home (comment)    Frequency  --  Add 3 additional days to program exercise sessions.  Add 3 additional days to program exercise sessions.  Add 3 additional days to program exercise sessions.    Initial Home Exercises Provided  --  01/10/19  01/10/19  01/10/19       Exercise Comments:  Exercise  Comments    Row Name 01/01/19 1149 01/10/19 1027 02/01/19 0856 02/27/19 1446     Exercise Comments  Pt first day of CR program. Pt tolerated exercise Rx well.  Reviewed HEP with Pt. Pt understands goals and is exercising at home.  Reviewed METs and goals with Pt. Pt is progressing well.  Reviewed METs and goals with Pt. Pt is progressing well.       Exercise Goals and Review:  Exercise Goals    Row Name 12/28/18 1132             Exercise Goals   Increase Physical Activity  Yes       Intervention  Provide advice, education, support and counseling about physical activity/exercise needs.;Develop an individualized exercise prescription for aerobic and resistive training based on initial evaluation findings, risk stratification, comorbidities and participant's personal goals.       Expected Outcomes  Short Term: Attend rehab on a regular basis to increase amount of physical activity.;Long Term: Add in home exercise to make exercise part of routine and to increase amount of physical activity.;Long Term: Exercising regularly at least 3-5 days a week.       Increase Strength and Stamina  Yes       Intervention  Provide advice, education, support and counseling about physical activity/exercise needs.;Develop an individualized exercise prescription for aerobic and resistive training based on initial evaluation findings, risk stratification, comorbidities and participant's personal goals.       Expected Outcomes  Short Term: Increase workloads from initial exercise prescription for  resistance, speed, and METs.;Short Term: Perform resistance training exercises routinely during rehab and add in resistance training at home;Long Term: Improve cardiorespiratory fitness, muscular endurance and strength as measured by increased METs and functional capacity (6MWT)       Able to understand and use rate of perceived exertion (RPE) scale  Yes       Intervention  Provide education and explanation on how to use RPE scale       Expected Outcomes  Short Term: Able to use RPE daily in rehab to express subjective intensity level;Long Term:  Able to use RPE to guide intensity level when exercising independently       Knowledge and understanding of Target Heart Rate Range (THRR)  Yes       Intervention  Provide education and explanation of THRR including how the numbers were predicted and where they are located for reference       Expected Outcomes  Short Term: Able to state/look up THRR;Long Term: Able to use THRR to govern intensity when exercising independently;Short Term: Able to use daily as guideline for intensity in rehab       Able to check pulse independently  Yes       Intervention  Provide education and demonstration on how to check pulse in carotid and radial arteries.;Review the importance of being able to check your own pulse for safety during independent exercise       Expected Outcomes  Short Term: Able to explain why pulse checking is important during independent exercise;Long Term: Able to check pulse independently and accurately       Understanding of Exercise Prescription  Yes       Intervention  Provide education, explanation, and written materials on patient's individual exercise prescription       Expected Outcomes  Short Term: Able to explain program exercise prescription;Long Term: Able to explain home exercise prescription to exercise independently  Exercise Goals Re-Evaluation : Exercise Goals Re-Evaluation    Row Name 01/01/19 1147 01/10/19 1026 02/01/19 0855  02/27/19 1441       Exercise Goal Re-Evaluation   Exercise Goals Review  Increase Physical Activity;Understanding of Exercise Prescription;Increase Strength and Stamina;Knowledge and understanding of Target Heart Rate Range (THRR);Able to understand and use rate of perceived exertion (RPE) scale  Increase Physical Activity;Increase Strength and Stamina;Able to understand and use rate of perceived exertion (RPE) scale;Knowledge and understanding of Target Heart Rate Range (THRR);Able to check pulse independently;Understanding of Exercise Prescription  Increase Physical Activity;Increase Strength and Stamina;Able to understand and use rate of perceived exertion (RPE) scale;Knowledge and understanding of Target Heart Rate Range (THRR);Able to check pulse independently;Understanding of Exercise Prescription  Increase Physical Activity;Increase Strength and Stamina;Able to understand and use rate of perceived exertion (RPE) scale;Knowledge and understanding of Target Heart Rate Range (THRR);Able to check pulse independently;Understanding of Exercise Prescription    Comments  Pt first day of CR program. Pt tolerated exercise Rx well. Pt understadns THRR, RPE scale, and exercise Rx.  Reviewed HEP with Pt. Pt understands home exercise goals, THRR< RPE scale, weather precautions, end points of exercise, pulse counting, and warm up and cool down stretches. Pt is currently wlaking 30 minutes two times per day 2-4 days per week in addition to CR program.  Reviewed METs and goals with Pt. Pt is progressing well and has a MET level of 5.3. Pt continues to increase workloads. Pt is exercising at home 2-4 days per week for 30-45 minutes per day in addition to CR program.  Reviewed METs and goals with Pt. Pt is progressing well and has a MET level of 4.5. Pt continues to increase workloads and MET levels. Pt stated he is walking and cycling 2-4 days for 30-45 minutes in addition to CR program.    Expected Outcomes  Will  continue to monitor and progress Pt as tolerated.  Will continue to monitor and progress Pt as tolerated.  Will continue to monitor and proress Pt as tolerated.  Will continue to monitor and progress Pt as tolerated.        Discharge Exercise Prescription (Final Exercise Prescription Changes): Exercise Prescription Changes - 02/23/19 1100      Response to Exercise   Blood Pressure (Admit)  98/60    Blood Pressure (Exercise)  128/64    Blood Pressure (Exit)  110/60    Heart Rate (Admit)  93 bpm    Heart Rate (Exercise)  145 bpm    Heart Rate (Exit)  97 bpm    Rating of Perceived Exertion (Exercise)  12    Symptoms  None    Duration  Continue with 30 min of aerobic exercise without signs/symptoms of physical distress.    Intensity  THRR unchanged      Progression   Progression  Continue to progress workloads to maintain intensity without signs/symptoms of physical distress.    Average METs  4.5      Resistance Training   Training Prescription  Yes    Weight  4 lbs.     Reps  10-15    Time  10 Minutes      Interval Training   Interval Training  No      Treadmill   MPH  3.3    Grade  1    Minutes  15    METs  3.98      NuStep   Level  4    SPM  85    Minutes  15    METs  5.1      Home Exercise Plan   Plans to continue exercise at  Home (comment)    Frequency  Add 3 additional days to program exercise sessions.    Initial Home Exercises Provided  01/10/19       Nutrition:  Target Goals: Understanding of nutrition guidelines, daily intake of sodium <1555m, cholesterol <2072m calories 30% from fat and 7% or less from saturated fats, daily to have 5 or more servings of fruits and vegetables.  Biometrics: Pre Biometrics - 12/28/18 1133      Pre Biometrics   Height  _0  (1.676 m)    Weight  58 kg    Waist Circumference  30 inches    Hip Circumference  34 inches    Waist to Hip Ratio  0.88 %    BMI (Calculated)  20.65    Triceps Skinfold  18 mm    % Body Fat   20.2 %    Grip Strength  33 kg    Flexibility  11 in    Single Leg Stand  30 seconds        Nutrition Therapy Plan and Nutrition Goals:   Nutrition Assessments:   Nutrition Goals Re-Evaluation:   Nutrition Goals Discharge (Final Nutrition Goals Re-Evaluation):   Psychosocial: Target Goals: Acknowledge presence or absence of significant depression and/or stress, maximize coping skills, provide positive support system. Participant is able to verbalize types and ability to use techniques and skills needed for reducing stress and depression.  Initial Review & Psychosocial Screening: Initial Psych Review & Screening - 12/28/18 1201      Initial Review   Current issues with  Current Stress Concerns    Source of Stress Concerns  Chronic Illness    Comments  patient currently on short term disability from his job. Wants to have time to recover fully from his heart attack before returning to work.      Family Dynamics   Good Support System?  Yes   James [name] has his wife for support     Barriers   Psychosocial barriers to participate in program  The patient should benefit from training in stress management and relaxation.      Screening Interventions   Interventions  Encouraged to exercise;Provide feedback about the scores to participant;To provide support and resources with identified psychosocial needs    Expected Outcomes  Short Term goal: Utilizing psychosocial counselor, staff and physician to assist with identification of specific Stressors or current issues interfering with healing process. Setting desired goal for each stressor or current issue identified.;Long Term Goal: Stressors or current issues are controlled or eliminated.;Short Term goal: Identification and review with participant of any Quality of Life or Depression concerns found by scoring the questionnaire.       Quality of Life Scores: Quality of Life - 12/28/18 1100      Quality of Life   Select  Quality of  Life      Quality of Life Scores   Health/Function Pre  18.17 %    Socioeconomic Pre  25.64 %    Psych/Spiritual Pre  26.57 %    Family Pre  25.2 %    GLOBAL Pre  22.47 %      Scores of 19 and below usually indicate a poorer quality of life in these areas.  A difference of  2-3 points is a clinically meaningful difference.  A difference of  2-3 points in the total score of the Quality of Life Index has been associated with significant improvement in overall quality of life, self-image, physical symptoms, and general health in studies assessing change in quality of life.  PHQ-9: Recent Review Flowsheet Data    Depression screen [facility] 2/9 01/01/2019   Decreased Interest 0   Down, Depressed, Hopeless 0   PHQ - 2 Score 0     Interpretation of Total Score  Total Score Depression Severity:  1-4 = Minimal depression, 5-9 = Mild depression, 10-14 = Moderate depression, 15-19 = Moderately severe depression, 20-27 = Severe depression   Psychosocial Evaluation and Intervention:   Psychosocial Re-Evaluation: Psychosocial Re-Evaluation    Row Name 01/30/19 1322 03/01/19 1611 03/01/19 1616         Psychosocial Re-Evaluation   Current issues with  Current Stress Concerns  --  Current Stress Concerns     Comments  James [name] has not voiced any increased stressors since he has been participating in phase 2 cardiac rehab.  James [name] has not voiced any increased stressors since he has been participating in phase 2 cardiac rehab.  --     Expected Outcomes  Will continue to monitor for stressors and offer support as needed  Will continue to monitor for stressors and offer support as needed  --     Interventions  Encouraged to attend Cardiac Rehabilitation for the exercise;Stress management education  Encouraged to attend Cardiac Rehabilitation for the exercise;Stress management education  --     Continue Psychosocial Services   No Follow up required  No Follow up required  --     Comments  patient currently on  short term disability from his job. Wants to have time to recover fully from his heart attack before returning to work.  patient currently on short term disability from his job. Wants to have time to recover fully from his heart attack before returning to work.  --       Initial Review   Source of Stress Concerns  Chronic Illness  Chronic Illness  --        Psychosocial Discharge (Final Psychosocial Re-Evaluation): Psychosocial Re-Evaluation - 03/01/19 1616      Psychosocial Re-Evaluation   Current issues with  Current Stress Concerns       Vocational Rehabilitation: Provide vocational rehab assistance to qualifying candidates.   Vocational Rehab Evaluation & Intervention: Vocational Rehab - 12/28/18 1204      Initial Vocational Rehab Evaluation & Intervention   Assessment shows need for Vocational Rehabilitation  No       Education: Education Goals: Education classes will be provided on a weekly basis, covering required topics. Participant will state understanding/return demonstration of topics presented.  Learning Barriers/Preferences: Learning Barriers/Preferences - 12/28/18 1134      Learning Barriers/Preferences   Learning Barriers  Language    Learning Preferences  Written Material       Education Topics: Hypertension, Hypertension Reduction -Define heart disease and high blood pressure. Discus how high blood pressure affects the body and ways to reduce high blood pressure.   Exercise and Your Heart -Discuss why it is important to exercise, the FITT principles of exercise, normal and abnormal responses to exercise, and how to exercise safely.   Angina -Discuss definition of angina, causes of angina, treatment of angina, and how to decrease risk of having angina.   Cardiac Medications -Review what the following cardiac medications are used for, how they affect the body, and side effects that  may occur when taking the medications.  Medications include Aspirin,  Beta blockers, calcium channel blockers, ACE Inhibitors, angiotensin receptor blockers, diuretics, digoxin, and antihyperlipidemics.   Congestive Heart Failure -Discuss the definition of CHF, how to live with CHF, the signs and symptoms of CHF, and how keep track of weight and sodium intake.   Heart Disease and Intimacy -Discus the effect sexual activity has on the heart, how changes occur during intimacy as we age, and safety during sexual activity.   Smoking Cessation / COPD -Discuss different methods to quit smoking, the health benefits of quitting smoking, and the definition of COPD.   Nutrition I: Fats -Discuss the types of cholesterol, what cholesterol does to the heart, and how cholesterol levels can be controlled.   Nutrition II: Labels -Discuss the different components of food labels and how to read food label   Heart Parts/Heart Disease and PAD -Discuss the anatomy of the heart, the pathway of blood circulation through the heart, and these are affected by heart disease.   Stress I: Signs and Symptoms -Discuss the causes of stress, how stress may lead to anxiety and depression, and ways to limit stress.   Stress II: Relaxation -Discuss different types of relaxation techniques to limit stress.   Warning Signs of Stroke / TIA -Discuss definition of a stroke, what the signs and symptoms are of a stroke, and how to identify when someone is having stroke.   Knowledge Questionnaire Score: Knowledge Questionnaire Score - 12/28/18 1100      Knowledge Questionnaire Score   Pre Score  21/24       Core Components/Risk Factors/Patient Goals at Admission: Personal Goals and Risk Factors at Admission - 12/28/18 1205      Core Components/Risk Factors/Patient Goals on Admission   Intervention  Weight Management: Develop a combined nutrition and exercise program designed to reach desired caloric intake, while maintaining appropriate intake of nutrient and fiber, sodium and  fats, and appropriate energy expenditure required for the weight goal.;Weight Management: Provide education and appropriate resources to help participant work on and attain dietary goals.    Admit Weight  127 lb 13.9 oz (58 kg)    Expected Outcomes  Weight Gain: Understanding of general recommendations for a high calorie, high protein meal plan that promotes weight gain by distributing calorie intake throughout the day with the consumption for 4-5 meals, snacks, and/or supplements;Understanding recommendations for meals to include 15-35% energy as protein, 25-35% energy from fat, 35-60% energy from carbohydrates, less than 224m of dietary cholesterol, 20-35 gm of total fiber daily;Understanding of distribution of calorie intake throughout the day with the consumption of 4-5 meals/snacks;Weight Maintenance: Understanding of the daily nutrition guidelines, which includes 25-35% calories from fat, 7% or less cal from saturated fats, less than 2050mcholesterol, less than 1.5gm of sodium, & 5 or more servings of fruits and vegetables daily;Long Term: Adherence to nutrition and physical activity/exercise program aimed toward attainment of established weight goal;Short Term: Continue to assess and modify interventions until short term weight is achieved    Heart Failure  Yes    Intervention  --   provide education to patient and give educational resources as needed   Expected Outcomes  Improve functional capacity of life;Short term: Attendance in program 2-3 days a week with increased exercise capacity. Reported lower sodium intake. Reported increased fruit and vegetable intake. Reports medication compliance.;Short term: Daily weights obtained and reported for increase. Utilizing diuretic protocols set by physician.;Long term: Adoption of self-care skills and  reduction of barriers for early signs and symptoms recognition and intervention leading to self-care maintenance.    Lipids  Yes    Intervention  Provide  education and support for participant on nutrition & aerobic/resistive exercise along with prescribed medications to achieve LDL <56m, HDL >410m    Expected Outcomes  Short Term: Participant states understanding of desired cholesterol values and is compliant with medications prescribed. Participant is following exercise prescription and nutrition guidelines.;Long Term: Cholesterol controlled with medications as prescribed, with individualized exercise RX and with personalized nutrition plan. Value goals: LDL < 7075mHDL > 40 mg.    Stress  Yes    Intervention  Offer individual and/or small group education and counseling on adjustment to heart disease, stress management and health-related lifestyle change. Teach and support self-help strategies.    Expected Outcomes  Short Term: Participant demonstrates changes in health-related behavior, relaxation and other stress management skills, ability to obtain effective social support, and compliance with psychotropic medications if prescribed.;Long Term: Emotional wellbeing is indicated by absence of clinically significant psychosocial distress or social isolation.       Core Components/Risk Factors/Patient Goals Review:  Goals and Risk Factor Review    Row Name 01/01/19 1040 01/30/19 1458 01/31/19 1430 03/01/19 1611       Core Components/Risk Factors/Patient Goals Review   Personal Goals Review  Weight Management/Obesity;Heart Failure;Lipids;Stress  Weight Management/Obesity;Heart Failure;Lipids;Stress  Weight Management/Obesity;Heart Failure;Lipids;Stress  Weight Management/Obesity;Heart Failure;Lipids;Stress    Review  James [name] started exercise at cardiac rehab on 01/01/19 tolerated his first day of exercise without difficulty  --  James [name] has been doing well with exercise and reports feeling stronger. James [name] continues to wear his lifevest. Some resting systolic blood pressures in the 90's has been asymptomatic  James [name] has been doing well with exercise and  reports feeling stronger. James [name] graduates from phase 2 cardiac rehab 03/02/19    Expected Outcomes  James [name] has multiplr risk factors and is willing to participate in phase 2 cardiac rehab to assist in imporving risk factor modifications  --  James [name] will continue to participate in phase 2 cardiac rehab for exercise, nutrtion and lifestyle modifications.  James [name] will continue to participate in phase 2 cardiac rehab for exercise, nutrtion and lifestyle modifications.       Core Components/Risk Factors/Patient Goals at Discharge (Final Review):  Goals and Risk Factor Review - 03/01/19 1611      Core Components/Risk Factors/Patient Goals Review   Personal Goals Review  Weight Management/Obesity;Heart Failure;Lipids;Stress    Review  James [name] has been doing well with exercise and reports feeling stronger. James [name] graduates from phase 2 cardiac rehab 03/02/19    Expected Outcomes  James [name] will continue to participate in phase 2 cardiac rehab for exercise, nutrtion and lifestyle modifications.       ITP Comments: ITP Comments    Row Name 12/28/18 1025 01/02/19 1531 01/30/19 1318 03/01/19 1611     ITP Comments  Dr. TraFransico Himedical Director  30 Day ITP Review. Patient completed his first day of exercise at cardiac rehab without difficulty.  30 Day ITP Review. Thierry is with good participation and attendance in phase 2 cardiac rehab.  30 Day ITP Review. Christianjames is with good participation and attendance in phase 2 cardiac rehab.       Comments: See ITP comments. Karel graduates tomorrow.MarBarnet PallN,BSN 03/01/2019 4:17 PM

## 2019-03-02 ENCOUNTER — Ambulatory Visit (HOSPITAL_COMMUNITY): Payer: BC Managed Care – PPO

## 2019-03-02 ENCOUNTER — Encounter (HOSPITAL_COMMUNITY): Payer: BC Managed Care – PPO

## 2019-03-07 ENCOUNTER — Encounter (HOSPITAL_COMMUNITY)
Admission: RE | Admit: 2019-03-07 | Discharge: 2019-03-07 | Disposition: A | Discharge status: Home / Self Care | Payer: BC Managed Care – PPO | Source: Ambulatory Visit | Attending: Interventional Cardiology | Admitting: Interventional Cardiology

## 2019-03-07 ENCOUNTER — Other Ambulatory Visit: Payer: Self-pay

## 2019-03-07 VITALS — Ht 66.0 in | Wt 119.7 lb

## 2019-03-07 DIAGNOSIS — Z955 Presence of coronary angioplasty implant and graft: Secondary | ICD-10-CM

## 2019-03-07 DIAGNOSIS — I2102 ST elevation (STEMI) myocardial infarction involving left anterior descending coronary artery: Secondary | ICD-10-CM | POA: Diagnosis not present

## 2019-03-07 NOTE — Progress Notes (Signed)
Discharge Progress Report  Patient Details  Name: James [name] MRN: [id number] Date of Birth: May 22, 1980 Referring Provider:     [place] from 12/28/2018 in [place]  Referring Provider  Dr. Tamala Julian       Number of Visits: 19  Reason for Discharge:  Patient reached a stable level of exercise. Patient independent in their exercise. Patient has met program and personal goals.  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 Tamala Julian    Expected Discharge Date  02/23/19      Recumbant Bike   Level  2    Watts  60    Minutes  15    METs  5.15      NuStep   Level  3    SPM  85    Minutes  15    METs  3      Prescription Details   Frequency (times per week)  3    Duration  Progress to 30 minutes of continuous aerobic without signs/symptoms of physical distress      Intensity   THRR 40-80% of Max Heartrate  73-146    Ratings of Perceived Exertion  11-13      Progression   Progression  Continue to progress workloads to maintain intensity without signs/symptoms of physical distress.      Resistance Training   Training Prescription  Yes    Weight  4 lbs.     Reps  10-15       Discharge Exercise Prescription (Final Exercise Prescription Changes): Exercise Prescription Changes - 03/07/19 1500      Response to Exercise   Blood Pressure (Admit)  100/70    Blood Pressure (Exercise)  122/66    Blood Pressure (Exit)  100/60    Heart Rate (Admit)  83 bpm     Heart Rate (Exercise)  100 bpm    Heart Rate (Exit)  84 bpm    Rating of Perceived Exertion (Exercise)  12    Symptoms  None    Comments  Pt last day of exercise.     Duration  Continue with 30 min of aerobic exercise without signs/symptoms of physical distress.    Intensity  THRR unchanged      Progression   Progression  Continue to progress workloads to maintain intensity without signs/symptoms of physical distress.    Average METs  4.5      Resistance Training   Training Prescription  No      Home Exercise Plan   Plans to continue exercise at  Home (comment)    Frequency  Add 3 additional days to program exercise sessions.    Initial Home Exercises Provided  01/10/19       Functional Capacity: 6 Minute Walk    Row Name 12/28/18 1129 03/07/19 1537       6 Minute Walk   Phase  Initial  Initial    Distance  1412 feet  1484 feet  Distance % Change  --  5.1 %    Distance Feet Change  --  72 ft    Walk Time  6 minutes  6 minutes    # of Rest Breaks  0  0    MPH  2.6  2.8    METS  5.1  5.5    RPE  11  12    Perceived Dyspnea   0  0    VO2 Peak  18.04  19.4    Symptoms  No  No    Resting HR  90 bpm  83 bpm    Resting BP  92/60  100/70    Resting Oxygen Saturation   99 %  --    Exercise Oxygen Saturation  during 6 min walk  100 %  --    Max Ex. HR  102 bpm  100 bpm    Max Ex. BP  114/80  122/66    2 Minute Post BP  112/78  100/60       Psychological, QOL, Others - Outcomes: PHQ 2/9: Depression screen [facility] 2/9 03/08/2019 03/07/2019 01/01/2019  Decreased Interest 0 0 0  Down, Depressed, Hopeless 0 0 0  PHQ - 2 Score 0 0 0    Quality of Life: Quality of Life - 03/07/19 1511      Quality of Life   Select  Quality of Life      Quality of Life Scores   Health/Function Post  24.4 %    Socioeconomic Post  28.29 %    Psych/Spiritual Post  24.86 %    Family Post  27.6 %    GLOBAL Post  25.76 %       Personal Goals: Goals established at orientation with  interventions provided to work toward goal. Personal Goals and Risk Factors at Admission - 12/28/18 1205      Core Components/Risk Factors/Patient Goals on Admission   Intervention  Weight Management: Develop a combined nutrition and exercise program designed to reach desired caloric intake, while maintaining appropriate intake of nutrient and fiber, sodium and fats, and appropriate energy expenditure required for the weight goal.;Weight Management: Provide education and appropriate resources to help participant work on and attain dietary goals.    Admit Weight  127 lb 13.9 oz (58 kg)    Expected Outcomes  Weight Gain: Understanding of general recommendations for a high calorie, high protein meal plan that promotes weight gain by distributing calorie intake throughout the day with the consumption for 4-5 meals, snacks, and/or supplements;Understanding recommendations for meals to include 15-35% energy as protein, 25-35% energy from fat, 35-60% energy from carbohydrates, less than 277m of dietary cholesterol, 20-35 gm of total fiber daily;Understanding of distribution of calorie intake throughout the day with the consumption of 4-5 meals/snacks;Weight Maintenance: Understanding of the daily nutrition guidelines, which includes 25-35% calories from fat, 7% or less cal from saturated fats, less than 2080mcholesterol, less than 1.5gm of sodium, & 5 or more servings of fruits and vegetables daily;Long Term: Adherence to nutrition and physical activity/exercise program aimed toward attainment of established weight goal;Short Term: Continue to assess and modify interventions until short term weight is achieved    Heart Failure  Yes    Intervention  --   provide education to patient and give educational resources as needed   Expected Outcomes  Improve functional capacity of life;Short term: Attendance in program 2-3 days a week with increased exercise capacity. Reported lower sodium intake. Reported increased  fruit and vegetable intake. Reports medication compliance.;Short term: Daily weights obtained and reported for increase. Utilizing diuretic protocols set by physician.;Long term: Adoption of self-care skills and reduction of barriers for early signs and symptoms recognition and intervention leading to self-care maintenance.    Lipids  Yes    Intervention  Provide education and support for participant on nutrition & aerobic/resistive exercise along with prescribed medications to achieve LDL <74m, HDL >490m    Expected Outcomes  Short Term: Participant states understanding of desired cholesterol values and is compliant with medications prescribed. Participant is following exercise prescription and nutrition guidelines.;Long Term: Cholesterol controlled with medications as prescribed, with individualized exercise RX and with personalized nutrition plan. Value goals: LDL < 7026mHDL > 40 mg.    Stress  Yes    Intervention  Offer individual and/or small group education and counseling on adjustment to heart disease, stress management and health-related lifestyle change. Teach and support self-help strategies.    Expected Outcomes  Short Term: Participant demonstrates changes in health-related behavior, relaxation and other stress management skills, ability to obtain effective social support, and compliance with psychotropic medications if prescribed.;Long Term: Emotional wellbeing is indicated by absence of clinically significant psychosocial distress or social isolation.        Personal Goals Discharge: Goals and Risk Factor Review    Row Name 01/01/19 1040 01/30/19 1458 01/31/19 1430 03/01/19 1611 03/07/19 1352     Core Components/Risk Factors/Patient Goals Review   Personal Goals Review  Weight Management/Obesity;Heart Failure;Lipids;Stress  Weight Management/Obesity;Heart Failure;Lipids;Stress  Weight Management/Obesity;Heart Failure;Lipids;Stress  Weight Management/Obesity;Heart Failure;Lipids;Stress   Weight Management/Obesity;Heart Failure;Lipids;Stress   Review  Jeremyah started exercise at cardiac rehab on 01/01/19 tolerated his first day of exercise without difficulty  --  Josedejesus has been doing well with exercise and reports feeling stronger. Shubham continues to wear his lifevest. Some resting systolic blood pressures in the 90's has been asymptomatic  Hughes has been doing well with exercise and reports feeling stronger. Amir graduates from phase 2 cardiac rehab 03/02/19  Cass has been doing well with exercise and reports feeling stronger. Ray graduates from phase 2 cardiac rehab 03/07/19. Kacy plans to continue exercising at home using his scifit bike and walking   Expected Outcomes  Griselda has multiplr risk factors and is willing to participate in phase 2 cardiac rehab to assist in imporving risk factor modifications  --  Zymire will continue to participate in phase 2 cardiac rehab for exercise, nutrtion and lifestyle modifications.  Karon will continue to participate in phase 2 cardiac rehab for exercise, nutrtion and lifestyle modifications.  Oshay will continue to exercise at home follow nutrtion and lifestyle modifications.      Exercise Goals and Review: Exercise Goals    Row Name 12/28/18 1132             Exercise Goals   Increase Physical Activity  Yes       Intervention  Provide advice, education, support and counseling about physical activity/exercise needs.;Develop an individualized exercise prescription for aerobic and resistive training based on initial evaluation findings, risk stratification, comorbidities and participant's personal goals.       Expected Outcomes  Short Term: Attend rehab on a regular basis to increase amount of physical activity.;Long Term: Add in home exercise to make exercise part of routine and to increase amount of physical activity.;Long Term: Exercising regularly at least 3-5 days a week.       Increase Strength and Stamina  Yes  Intervention   Provide advice, education, support and counseling about physical activity/exercise needs.;Develop an individualized exercise prescription for aerobic and resistive training based on initial evaluation findings, risk stratification, comorbidities and participant's personal goals.       Expected Outcomes  Short Term: Increase workloads from initial exercise prescription for resistance, speed, and METs.;Short Term: Perform resistance training exercises routinely during rehab and add in resistance training at home;Long Term: Improve cardiorespiratory fitness, muscular endurance and strength as measured by increased METs and functional capacity (6MWT)       Able to understand and use rate of perceived exertion (RPE) scale  Yes       Intervention  Provide education and explanation on how to use RPE scale       Expected Outcomes  Short Term: Able to use RPE daily in rehab to express subjective intensity level;Long Term:  Able to use RPE to guide intensity level when exercising independently       Knowledge and understanding of Target Heart Rate Range (THRR)  Yes       Intervention  Provide education and explanation of THRR including how the numbers were predicted and where they are located for reference       Expected Outcomes  Short Term: Able to state/look up THRR;Long Term: Able to use THRR to govern intensity when exercising independently;Short Term: Able to use daily as guideline for intensity in rehab       Able to check pulse independently  Yes       Intervention  Provide education and demonstration on how to check pulse in carotid and radial arteries.;Review the importance of being able to check your own pulse for safety during independent exercise       Expected Outcomes  Short Term: Able to explain why pulse checking is important during independent exercise;Long Term: Able to check pulse independently and accurately       Understanding of Exercise Prescription  Yes       Intervention  Provide  education, explanation, and written materials on patient's individual exercise prescription       Expected Outcomes  Short Term: Able to explain program exercise prescription;Long Term: Able to explain home exercise prescription to exercise independently          Exercise Goals Re-Evaluation: Exercise Goals Re-Evaluation    Row Name 01/01/19 1147 01/10/19 1026 02/01/19 0855 02/27/19 1441 03/07/19 1540     Exercise Goal Re-Evaluation   Exercise Goals Review  Increase Physical Activity;Understanding of Exercise Prescription;Increase Strength and Stamina;Knowledge and understanding of Target Heart Rate Range (THRR);Able to understand and use rate of perceived exertion (RPE) scale  Increase Physical Activity;Increase Strength and Stamina;Able to understand and use rate of perceived exertion (RPE) scale;Knowledge and understanding of Target Heart Rate Range (THRR);Able to check pulse independently;Understanding of Exercise Prescription  Increase Physical Activity;Increase Strength and Stamina;Able to understand and use rate of perceived exertion (RPE) scale;Knowledge and understanding of Target Heart Rate Range (THRR);Able to check pulse independently;Understanding of Exercise Prescription  Increase Physical Activity;Increase Strength and Stamina;Able to understand and use rate of perceived exertion (RPE) scale;Knowledge and understanding of Target Heart Rate Range (THRR);Able to check pulse independently;Understanding of Exercise Prescription  Increase Physical Activity;Understanding of Exercise Prescription;Increase Strength and Stamina;Knowledge and understanding of Target Heart Rate Range (THRR);Able to understand and use rate of perceived exertion (RPE) scale;Able to check pulse independently   Comments  Pt first day of CR program. Pt tolerated exercise Rx well. Pt understadns THRR, RPE scale,  and exercise Rx.  Reviewed HEP with Pt. Pt understands home exercise goals, THRR< RPE scale, weather precautions,  end points of exercise, pulse counting, and warm up and cool down stretches. Pt is currently wlaking 30 minutes two times per day 2-4 days per week in addition to CR program.  Reviewed METs and goals with Pt. Pt is progressing well and has a MET level of 5.3. Pt continues to increase workloads. Pt is exercising at home 2-4 days per week for 30-45 minutes per day in addition to CR program.  Reviewed METs and goals with Pt. Pt is progressing well and has a MET level of 4.5. Pt continues to increase workloads and MET levels. Pt stated he is walking and cycling 2-4 days for 30-45 minutes in addition to CR program.  Pt graduated CR program today. Pt progressed well in the program and ended with a MET level of 4.5. Pt increased workloads and MET levels gradually. Pt stated he would continue xercisign at home by walking and biking 5-7 days per week for 30-45 minutes.   Expected Outcomes  Will continue to monitor and progress Pt as tolerated.  Will continue to monitor and progress Pt as tolerated.  Will continue to monitor and proress Pt as tolerated.  Will continue to monitor and progress Pt as tolerated.  Pt will continue exercising at home.      Nutrition & Weight - Outcomes: Pre Biometrics - 12/28/18 1133      Pre Biometrics   Height  5' 6"  (1.676 m)    Weight  58 kg    Waist Circumference  30 inches    Hip Circumference  34 inches    Waist to Hip Ratio  0.88 %    BMI (Calculated)  20.65    Triceps Skinfold  18 mm    % Body Fat  20.2 %    Grip Strength  33 kg    Flexibility  11 in    Single Leg Stand  30 seconds      Post Biometrics - 03/07/19 1543       Post  Biometrics   Height  5' 6"  (1.676 m)    Weight  54.3 kg    Waist Circumference  28.5 inches    Hip Circumference  33 inches    Waist to Hip Ratio  0.86 %    BMI (Calculated)  19.33    Triceps Skinfold  17 mm    % Body Fat  18.8 %    Grip Strength  36 kg    Flexibility  15.5 in    Single Leg Stand  35 seconds        Nutrition:   Nutrition Discharge:   Education Questionnaire Score: Knowledge Questionnaire Score - 03/07/19 1511      Knowledge Questionnaire Score   Post Score  23/24       Goals reviewed with patient; copy given to patient.Pt graduated from cardiac rehab program today with completion of 19 exercise sessions in Phase II. Pt maintained good attendance and progressed nicely during his participation in rehab as evidenced by increased MET level.   Medication list reconciled. Repeat  PHQ score- 0 .  Pt has made significant lifestyle changes and should be commended for his success. Pt feels he has achieved his goals during cardiac rehab.   Pt plans to continue exercise by using his sci fit bike and walking. Foch hopes to return to work in January.Symeon increased his distance on his post  exercise walk test and lost 3.7 kg. We are proud of Drew's progress. Lamon says that he feels stronger and has more confidence when exercising.Barnet Pall, RN,BSN 03/08/2019 3:12 PM

## 2019-03-13 NOTE — Progress Notes (Signed)
Cardiology Office Note:    Date:  03/14/2019   ID:  James [name], DOB 05/05/1980, MRN [id number]  PCP:  Patient, No Pcp Per  Cardiologist:  Sinclair Grooms, MD   Referring MD: No ref. provider found   Chief Complaint  Patient presents with  . Coronary Artery Disease    History of Present Illness:    James [name] is a 38 y.o. 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  James [name] is doing well.  He still has atypical left axillary and lateral pectoral discomfort.  It occurs with activity.  He denies orthopnea, PND, palpitations, and lower extremity edema.  He is stopped taking statin therapy as he feels that it was causing chest pain.  I have no current lipid levels.  We discussed the importance of lipid control to avoid future cardiac events.  He is having these levels monitored at an outside lab and will provide information to me.  We discussed the target LDL being less than 70 and at his age possibly should be less than 55 to give maximal protection.  Discussed coronary artery to pulmonary fistula from the left main coronary and native right coronary to the PDA.  We will redemonstrate these with coronary CT at some point in the future.  Consider embolization either here or at a [facility] depending upon findings.   Past Medical History:  Diagnosis Date  . Acute combined systolic and diastolic heart failure ([place]) 10-30-18  . Acute ST elevation myocardial infarction (STEMI) ([place]) 10/12/2018  . At risk for sudden cardiac death 10/30/2018  . Cardiomyopathy, ischemic 11/14/2018  . CHF (congestive heart failure) ([place])   . 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 Coronary artery disease involving native coronary artery of native heart with unstable  angina pectoris ([place]) 2018/10/30  . Delayed presentation of acute anterolateral STEMI 10/12/2018  . Hyperlipidemia    possible elevated triglycerides  . Ischemic cardiomyopathy    EF 30-35% // Echo 11/2018: Anteroseptal and anterior hypokinesis, EF 30-35, small circumferential pericardial effusion, no intracardiac thrombi   . Presence of drug coated stent in LAD coronary artery Oct 30, 2018   Ostial LAD DES PCI: Resolute Onyx 3.5 mm x 18 mm - 3.6 mm  . STEMI (ST elevation myocardial infarction) ([place]) 10/12/2018    Past Surgical History:  Procedure Laterality Date  . CARDIAC CATHETERIZATION    . CORONARY/GRAFT ACUTE MI REVASCULARIZATION N/A 10/12/2018   Procedure: CORONARY/GRAFT ACUTE MI REVASCULARIZATION;  Surgeon: [name], MD;  Location: [facility];  Service: Cardiovascular;  Laterality: N/A;  . LEFT HEART CATH AND CORONARY ANGIOGRAPHY N/A 10/12/2018   Procedure: LEFT HEART CATH AND CORONARY ANGIOGRAPHY;  Surgeon: [name], MD;  Location: [facility];  Service: Cardiovascular;  Laterality: N/A;  . RIGHT/LEFT HEART CATH AND CORONARY ANGIOGRAPHY N/A 11/13/2018   Procedure: RIGHT/LEFT HEART CATH AND CORONARY ANGIOGRAPHY;  Surgeon: [name], MD;  Location: [facility];  Service: Cardiovascular;  Laterality: N/A;    Current Medications: Current Meds  Medication Sig  . aspirin 81 MG chewable tablet Chew 1 tablet (81 mg total) by mouth daily.  . metoprolol succinate (TOPROL-XL) 25 MG 24 hr tablet Take 0.5 tablets (12.5 mg total) by mouth daily.  . nitroGLYCERIN (NITROSTAT) 0.4  MG SL tablet Place 1 tablet (0.4 mg total) under the tongue every 5 (five) minutes x 3 doses as needed for chest pain.  . pantoprazole (PROTONIX) 40 MG tablet Take 1 tablet (40 mg total) by mouth daily.  . sacubitril-valsartan (ENTRESTO) 49-51 MG Take 1 tablet by mouth 2 (two) times daily.   . ticagrelor (BRILINTA) 90 MG TABS tablet Take 1 tablet (90 mg total) by mouth 2 (two) times  daily.     Allergies:   Patient has no known allergies.   Social History   Socioeconomic History  . Marital status: Married    Spouse name: Not on file  . Number of children: Not on file  . Years of education: 2312  . Highest education level: Not on file  Occupational History  . Occupation: Primary school teacherT    Employer: [employer]   Tobacco Use  . Smoking status: Never Smoker  . Smokeless tobacco: Never Used  Substance and Sexual Activity  . Alcohol use: Yes    Alcohol/week: 2.0 standard drinks    Types: 2 Cans of beer per week    Comment: 2 packs/month   . Drug use: Never  . Sexual activity: Not on file  Other Topics Concern  . Not on file  Social History Narrative   Patient lives [place] in [place] and small child   Works in Consulting civil engineerT for [employer]   Social Determinants of Health   Financial Resource Strain: Low Risk   . Difficulty of Paying Living Expenses: Not hard at all  Food Insecurity: No Food Insecurity  . Worried About Programme researcher, broadcasting/film/videounning Out of Food in the Last Year: Never true  . Ran Out of Food in the Last Year: Never true  Transportation Needs: No Transportation Needs  . Lack of Transportation (Medical): No  . Lack of Transportation (Non-Medical): No  Physical Activity: Sufficiently Active  . Days of Exercise per Week: 4 days  . Minutes of Exercise per Session: 40 min  Stress: No Stress Concern Present  . Feeling of Stress : Only a little  Social Connections:   . Frequency of Communication with Friends and Family: Not on file  . Frequency of Social Gatherings with Friends and Family: Not on file  . Attends Religious Services: Not on file  . Active Member of Clubs or Organizations: Not on file  . Attends BankerClub or Organization Meetings: Not on file  . Marital Status: Not on file     Family History: The patient's family history includes Diabetes in his brother; Thyroid disease in his brother and mother. There is no history of Heart disease.  ROS:   Please see the history of  present illness.    Has anxiety concerning his long-term prognosis.  Wants to be cleared to do more vigorous activity.  Feels it has been some correlation between various types of chest pain and statin therapy.  All other systems reviewed and are negative.  EKGs/Labs/Other Studies Reviewed:    The following studies were reviewed today: No new data  EKG:  EKG not repeated  Recent Labs: 10/13/2018: B Natriuretic Peptide 325.1; TSH 3.547 10/14/2018: Magnesium 2.1 11/12/2018: Platelets 213 11/13/2018: Hemoglobin 11.9 12/29/2018: BUN 11; Creatinine, Ser 1.15; Potassium 4.5; Sodium 141 02/20/2019: ALT 12  Recent Lipid Panel    Component Value Date/Time   CHOL 159 02/20/2019 1115   TRIG 214 (H) 02/20/2019 1115   HDL 33 (L) 02/20/2019 1115   CHOLHDL 4.8 02/20/2019 1115   CHOLHDL 4.6 10/13/2018 0317   VLDL  35 10/13/2018 0317   LDLCALC 90 02/20/2019 1115    Physical Exam:    VS:  BP 108/78   Pulse 86   Ht 5\' 6"  (1.676 m)   Wt 123 lb (55.8 kg)   SpO2 100%   BMI 19.85 kg/m     Wt Readings from Last 3 Encounters:  03/14/19 123 lb (55.8 kg)  03/07/19 119 lb 11.4 oz (54.3 kg)  02/05/19 126 lb (57.2 kg)     GEN: Young healthy appearing. No acute distress HEENT: Normal NECK: No JVD. LYMPHATICS: No lymphadenopathy CARDIAC:  RRR without murmur, gallop, or edema. VASCULAR:  Normal Pulses. No bruits. RESPIRATORY:  Clear to auscultation without rales, wheezing or rhonchi  ABDOMEN: Soft, non-tender, non-distended, No pulsatile mass, MUSCULOSKELETAL: No deformity  SKIN: Warm and dry NEUROLOGIC:  Alert and oriented x 3 PSYCHIATRIC:  Normal affect   ASSESSMENT:    1. Coronary artery disease involving native coronary artery of native heart without angina pectoris   2. Chronic systolic CHF (congestive heart failure) (H[facility])   3. Hyperlipidemia with target LDL less than 70   4. Essential hypertension   5. Ischemic cardiomyopathy   6. 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 Educated about COVID-19 virus infection    PLAN:    In order of problems listed above:  1. Secondary prevention discussed 2. Ejection fraction is 34%.  He chose against proceeding with AICD placement.  We will recheck an ejection fraction in approximately 4 to 6 months. 3. LDL target less than 70.  He is not currently on statin therapy.  He was informed that this needs to be treated.  He will furnish the next laboratory data to me to make a decision about therapy.  May need to go to the lipid clinic to get started on alternative therapy, perhaps Zetia plus Nexletol. 4. 130/80 is the target.  This has not been an issue at all.  In fact, blood pressures have somewhat limited heart failure therapy.  We may be able to increase beta-blocker intensity in the future if he continues to run in the current range. 5. LAD occlusion with late presenting infarction before reperfusion.  No gallop is heard on exam. 6. Needs to be demonstrated by CT angio for embolization established.  We will probably do this sometime in 2021. 7. 3W's discussed to avoid COVID-19 infection.  Greater than 50% of the time during this office visit was spent in education, counseling, and coordination of care related to underlying disease process and testing as outlined.    Medication Adjustments/Labs and Tests Ordered: Current medicines are reviewed at length with the patient today.  Concerns regarding medicines are outlined above.  No orders of the defined types were placed in this encounter.  No orders of the defined types were placed in this encounter.   Patient Instructions  Medication Instructions:  Your physician recommends that you continue on your current medications as directed. Please refer to the Current Medication list given to you today.  *If you need a refill on your cardiac medications before your next appointment, please call your pharmacy*  Lab Work: None If you have labs (blood work) drawn today  and your tests are completely normal, you will receive your results only by: 2022 MyChart Message (if you have MyChart) OR . A paper copy in the mail If you have any lab test that is abnormal or we need to change your treatment, we will call you to review the  results.  Testing/Procedures: None  Follow-Up: At [facility], you and your health needs are our priority.  As part of our continuing mission to provide you with exceptional heart care, we have created designated Provider Care Teams.  These Care Teams include your primary Cardiologist (physician) and Advanced Practice Providers (APPs -  Physician Assistants and Nurse Practitioners) who all work together to provide you with the care you need, when you need it.  Your next appointment:   4 month(s)  The format for your next appointment:   In Person  Provider:   You may see Lesleigh Noe, MD or one of the following Advanced Practice Providers on your designated Care Team:    Norma Fredrickson, NP  Nada Boozer, NP  Georgie Chard, NP   Other Instructions      Signed, Lesleigh Noe, MD  03/14/2019 12:27 PM    [facility]

## 2019-03-14 ENCOUNTER — Encounter: Payer: Self-pay | Admitting: Interventional Cardiology

## 2019-03-14 ENCOUNTER — Other Ambulatory Visit: Payer: Self-pay

## 2019-03-14 ENCOUNTER — Ambulatory Visit (INDEPENDENT_AMBULATORY_CARE_PROVIDER_SITE_OTHER): Payer: BC Managed Care – PPO | Admitting: Interventional Cardiology

## 2019-03-14 VITALS — BP 108/78 | HR 86 | Ht 66.0 in | Wt 123.0 lb

## 2019-03-14 DIAGNOSIS — I2510 Atherosclerotic heart disease of native coronary artery without angina pectoris: Secondary | ICD-10-CM

## 2019-03-14 DIAGNOSIS — E785 Hyperlipidemia, unspecified: Secondary | ICD-10-CM

## 2019-03-14 DIAGNOSIS — I5022 Chronic systolic (congestive) heart failure: Secondary | ICD-10-CM | POA: Diagnosis not present

## 2019-03-14 DIAGNOSIS — I110 Hypertensive heart disease with heart failure: Secondary | ICD-10-CM | POA: Diagnosis not present

## 2019-03-14 DIAGNOSIS — I255 Ischemic cardiomyopathy: Secondary | ICD-10-CM

## 2019-03-14 DIAGNOSIS — I10 Essential (primary) hypertension: Secondary | ICD-10-CM

## 2019-03-14 DIAGNOSIS — Q245 Malformation of coronary vessels: Secondary | ICD-10-CM

## 2019-03-14 DIAGNOSIS — Z7189 Other specified counseling: Secondary | ICD-10-CM

## 2019-03-14 NOTE — Patient Instructions (Signed)
Medication Instructions:  Your physician recommends that you continue on your current medications as directed. Please refer to the Current Medication list given to you today.  *If you need a refill on your cardiac medications before your next appointment, please call your pharmacy*  Lab Work: None If you have labs (blood work) drawn today and your tests are completely normal, you will receive your results only by: . MyChart Message (if you have MyChart) OR . A paper copy in the mail If you have any lab test that is abnormal or we need to change your treatment, we will call you to review the results.  Testing/Procedures: None  Follow-Up: At [facility], you and your health needs are our priority.  As part of our continuing mission to provide you with exceptional heart care, we have created designated Provider Care Teams.  These Care Teams include your primary Cardiologist (physician) and Advanced Practice Providers (APPs -  Physician Assistants and Nurse Practitioners) who all work together to provide you with the care you need, when you need it.  Your next appointment:   4 month(s)  The format for your next appointment:   In Person  Provider:   You may see Henry W Smith III, MD or one of the following Advanced Practice Providers on your designated Care Team:    Lori Gerhardt, NP  Laura Ingold, NP  [name], NP   Other Instructions   

## 2019-05-21 ENCOUNTER — Other Ambulatory Visit: Payer: Self-pay

## 2019-05-21 ENCOUNTER — Other Ambulatory Visit: Payer: BC Managed Care – PPO | Admitting: *Deleted

## 2019-05-21 DIAGNOSIS — E785 Hyperlipidemia, unspecified: Secondary | ICD-10-CM

## 2019-05-21 LAB — LIPID PANEL
Chol/HDL Ratio: 5.1 ratio — ABNORMAL HIGH (ref 0.0–5.0)
Cholesterol, Total: 172 mg/dL (ref 100–199)
HDL: 34 mg/dL — ABNORMAL LOW (ref >39)
LDL Chol Calc (NIH): 93 mg/dL (ref 0–99)
Triglycerides: 270 mg/dL — ABNORMAL HIGH (ref 0–149)
VLDL Cholesterol Cal: 45 mg/dL — ABNORMAL HIGH (ref 5–40)

## 2019-05-21 LAB — HEPATIC FUNCTION PANEL
ALT: 9 IU/L (ref 0–44)
AST: 16 IU/L (ref 0–40)
Albumin: 4.6 g/dL (ref 4.0–5.0)
Alkaline Phosphatase: 71 IU/L (ref 39–117)
Bilirubin Total: 0.6 mg/dL (ref 0.0–1.2)
Bilirubin, Direct: 0.15 mg/dL (ref 0.00–0.40)
Total Protein: 6.5 g/dL (ref 6.0–8.5)

## 2019-05-31 NOTE — Progress Notes (Signed)
Patient ID: James [name]                 DOB: [birth year]                    MRN: [id number]     HPI: James [name] is a 39 y.o. male patient referred to lipid clinic by [facility]. 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 He was last seen by Dr. Tamala [name] on 03/14/19, and pt said he stopped taking rosuvastatin because he felt it was causing chest pain.   Patient arrives in good spirits today for initial visit. He states that when he was started on atorvastatin after his index event in July 2020, he had sudden, severe chest pain that stopped almost immediately after stopping the medication. Three weeks later, he tried taking rosuvastatin 10mg  daily. When he experienced the same pain again, he decreased the dose to 10mg  two times weekly, and eventually discontinued the dose all together due to the severe chest pain. The pain resolved upon discontinuation, and he is not interested in trying other statins. He sticks to a heart healthy diet with some servings of white rice and exercises rigorously on a daily basis, so he is curious as to why his cholesterol levels are elevated when maintains a healthy lifestyle and has no significant family history. He states that he had a heavy dinner at 10pm the night before his most recent labs on 05/21/19, and he wishes to recheck fasting lipid panel before starting any new medications.  Upon discussing new lipid-lowering medications, he expresses concern over the medication costs. He currently has Pharmacist, community through [place], but he is considering moving back to [place] for 1-2 years if he does not feel well living in the [place].  Current Medications: none  Intolerances: atorvastatin 80mg  daily (sudden severe muscle/chest pain), rosuvastatin 10mg  daily and 10mg  two times  weekly (sudden severe muscle/chest pain with both doses)  Risk Factors: premature CAD, CHF, prior MI     LDL goal: < 55   Diet: No meat or dairy or wheat. Eats brown rice and whole grains. Ate oatmeal with strawberries for breakfast, zucchini curry for lunch, white rice and dal (lentils) for dinner. Only drinks water with occasional unsweetened tea.  Exercise: Stationary bike 30-45 minutes daily, walks for 1 hour daily  Family History: The patient's family history includes Diabetes in his brother; Thyroid disease in his brother and mother. There is no history of Heart disease.  Social History: Never smoker, 2 cans beer weekly   Labs: 05/21/19: TC 172, TG 270, HDL 34, LDL 93 (none) 02/20/19: TC 159, TG 214, HDL 33, LDL 90 (none) 12/01/18: TC 157, TG 212, HDL 29, LDL 92 (none, stopped atorvastatin 80mg  3 weeks prior) 10-18-2018: TC 196, TG 174, HDL 43, LDL 118 (baseline)  Past Medical History:  Diagnosis Date  . Acute combined systolic and diastolic heart failure ([place]) 10/18/18  . Acute ST elevation myocardial infarction (STEMI) ([place]) 10/12/2018  . At risk for sudden cardiac death 2018-10-18  . Cardiomyopathy, ischemic 11/14/2018  . CHF (congestive heart failure) ([place])   . 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 Coronary artery disease involving native coronary artery of native heart with unstable angina pectoris ([place]) 10-18-2018  . Delayed presentation of acute  anterolateral STEMI 10/12/2018  . Hyperlipidemia    possible elevated triglycerides  . Ischemic cardiomyopathy    EF 30-35% // Echo 11/2018: Anteroseptal and anterior hypokinesis, EF 30-35, small circumferential pericardial effusion, no intracardiac thrombi   . Presence of drug coated stent in LAD coronary artery 10/13/2018   Ostial LAD DES PCI: Resolute Onyx 3.5 mm x 18 mm - 3.6 mm  . STEMI (ST elevation myocardial infarction) ([facility]) 10/12/2018    Current Outpatient Medications on File Prior to Visit  Medication Sig  Dispense Refill  . aspirin 81 MG chewable tablet Chew 1 tablet (81 mg total) by mouth daily. 90 tablet 1  . metoprolol succinate (TOPROL-XL) 25 MG 24 hr tablet Take 0.5 tablets (12.5 mg total) by mouth daily. 15 tablet 6  . nitroGLYCERIN (NITROSTAT) 0.4 MG SL tablet Place 1 tablet (0.4 mg total) under the tongue every 5 (five) minutes x 3 doses as needed for chest pain. 25 tablet 2  . pantoprazole (PROTONIX) 40 MG tablet Take 1 tablet (40 mg total) by mouth daily. 90 tablet 3  . sacubitril-valsartan (ENTRESTO) 49-51 MG Take 1 tablet by mouth 2 (two) times daily.     . ticagrelor (BRILINTA) 90 MG TABS tablet Take 1 tablet (90 mg total) by mouth 2 (two) times daily. 180 tablet 3   No current facility-administered medications on file prior to visit.    No Known Allergies  Assessment/Plan:  1. Hyperlipidemia - LDL is elevated and above goal of < 55. Patient does not want to try another statin given previous intolerance. Discussed the benefits of PCSK9 inhibitors and Nexlizet and recommended starting a PCSK9 injection to lower his LDL and provide cardiovascular benefit. Given his concerns regarding cost, patient would like to do more research and discuss with his family before deciding whether he would like to start either PCSK9 injections or Nexlizet. We talked about the use of copay cards to help with cost and the possibility of applying for patient assistance programs with the injections if he loses his insurance. TG also elevated and above goal of <150. Will submit prior authorization information for Vascepa. Discussed the benefits of maintaining regular exercise routine and a heart healthy diet low in saturated fats and carbohydrates. Pt will try to limit his carbohydrate intake. Will follow-up with pt via phone call next week to discuss whether he would like to start a PCSK9 or Nexlizet and will schedule labs at that time.   Tama Headings, PharmD PGY1 Pharmacy Resident  Olene Floss, Pharm.D,  BCPS, CPP [facility]  [address], [place], [place] [address]  Phone: [phone]; Fax: [phone]

## 2019-06-01 ENCOUNTER — Ambulatory Visit (INDEPENDENT_AMBULATORY_CARE_PROVIDER_SITE_OTHER): Payer: BC Managed Care – PPO | Admitting: Pharmacist

## 2019-06-01 ENCOUNTER — Other Ambulatory Visit: Payer: Self-pay

## 2019-06-01 DIAGNOSIS — E785 Hyperlipidemia, unspecified: Secondary | ICD-10-CM | POA: Diagnosis not present

## 2019-06-01 NOTE — Patient Instructions (Addendum)
It was nice to meet you today!  Your LDL is 93 which is above your goal of < 55  REPATHA and PRLALUENT are PCSK9 injections that will lower your LDL cholesterol by 60%. This medication is stored in the fridge, is given every 2 weeks into the fatty tissue of your stomach. These medications will cost $5-25 using a copay card.  NEXLIZET is a combination tablet that will lower your LDL cholesterol by 40%. This medication will cost $10 using a copay card.  Your TG is 270 which is above your goal of < 150.  I will submit information to your insurance to see if they will cover Vascepa. This medication is taken twice daily and will lower your triglyceride levels and provide long-term cardiovascular benefit.  Continue to maintain a regular exercise regimen and a diet low in saturated fats and carbohydrates.  I will call you sometime next week to discuss which medication you would like to start. Please call Wynona Canes or Efraim Kaufmann at [phone] if you have any questions.

## 2019-06-06 ENCOUNTER — Telehealth: Payer: Self-pay | Admitting: Pharmacist

## 2019-06-06 DIAGNOSIS — E785 Hyperlipidemia, unspecified: Secondary | ICD-10-CM

## 2019-06-06 MED ORDER — ICOSAPENT ETHYL 1 G PO CAPS: 2.0000 g | ORAL_CAPSULE | Freq: Two times a day (BID) | ORAL | 11 refills | Status: DC

## 2019-06-06 MED ORDER — VASCEPA 1 G PO CAPS: 2.0000 g | ORAL_CAPSULE | Freq: Two times a day (BID) | ORAL | 3 refills | Status: DC

## 2019-06-06 NOTE — Telephone Encounter (Signed)
Prior authorization approved for Vascepa from 05/07/19 to 06/05/22 (Case number [id number]). New rx sent to [facility] along with copay card information. Pt is aware of $9 copay.  Pt would also like to start Repatha injections. Will submit prior authorization and call in copay card information to pharmacy once approved.

## 2019-06-07 MED ORDER — REPATHA SURECLICK 140 MG/ML ~~LOC~~ SOAJ: 1.0000 "pen " | SUBCUTANEOUS | 11 refills | Status: DC

## 2019-06-07 NOTE — Telephone Encounter (Signed)
Prior authorization approved for Repatha injections from 05/08/19-06/06/20 (Case # [id number]). New Rx and copay card information sent to Pharmacy. Patient aware of $5 copay.   Patient would like to receive his first injection in clinic. He will call us when he picks up the medication to schedule a time when he can come into the office.  Scheduled follow-up fasting labs for 08/20/19.

## 2019-06-12 ENCOUNTER — Other Ambulatory Visit: Payer: Self-pay

## 2019-06-12 ENCOUNTER — Ambulatory Visit (INDEPENDENT_AMBULATORY_CARE_PROVIDER_SITE_OTHER): Payer: BC Managed Care – PPO | Admitting: Pharmacist

## 2019-06-12 DIAGNOSIS — E785 Hyperlipidemia, unspecified: Secondary | ICD-10-CM

## 2019-06-12 NOTE — Progress Notes (Signed)
Patient presents today for Repatha injection training. Patient successfully injected medication into his abdomen. Next injection is due June 26, 2019 and patient feels comfortable to self-administer injection at home. Follow-up fasting labs scheduled for 07/16/19.  Tama Headings, PharmD PGY1 Pharmacy Resident

## 2019-06-15 ENCOUNTER — Other Ambulatory Visit: Payer: Self-pay | Admitting: Cardiology

## 2019-06-18 ENCOUNTER — Other Ambulatory Visit: Payer: Self-pay

## 2019-06-18 MED ORDER — METOPROLOL SUCCINATE ER 25 MG PO TB24: 12.5000 mg | ORAL_TABLET | Freq: Every day | ORAL | 3 refills | Status: DC

## 2019-06-19 NOTE — Telephone Encounter (Signed)
Outpatient Medication Detail   Disp Refills Start End   metoprolol succinate (TOPROL-XL) 25 MG 24 hr tablet 15 tablet 3 06/18/2019    Sig - Route: Take 0.5 tablets (12.5 mg total) by mouth daily. - Oral   Sent to pharmacy as: metoprolol succinate (TOPROL-XL) 25 MG 24 hr tablet   E-Prescribing Status: Receipt confirmed by pharmacy (06/18/2019  3:54 PM EDT)   [phone] [place] [place] - [address] [facility]

## 2019-07-15 NOTE — Progress Notes (Addendum)
Cardiology Office Note:    Date:  07/16/2019   ID:  James [name], DOB [birth year], MRN [id number]  PCP:  Patient, No Pcp Per  Cardiologist:  Sinclair Grooms, MD   Referring MD: No ref. provider found   Chief Complaint  Patient presents with  . Coronary Artery Disease  . Congestive Heart Failure    History of Present Illness:    James [name] is a 39 y.o. 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  Vascepa is causing side effects.  These are heartburn and occasional feeling as though his heart is racing.  Episodes of chest discomfort have resolved.  Does state that he feels cold all the time.  He denies orthopnea, PND, syncope, lower extremity swelling, and unexpected dizziness.  He will be relocating in June.  Does not yet know where he will be.  Very vague information concerning the move.  Past Medical History:  Diagnosis Date  . Acute combined systolic and diastolic heart failure ([place]) 10/23/18  . Acute ST elevation myocardial infarction (STEMI) ([place]) 10/12/2018  . At risk for sudden cardiac death 10/23/18  . Cardiomyopathy, ischemic 11/14/2018  . CHF (congestive heart failure) ([place])   . 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 Coronary artery disease involving native coronary artery of native heart with unstable angina pectoris ([place]) 23-Oct-2018  . Delayed presentation of acute anterolateral STEMI 10/12/2018  . Hyperlipidemia    possible elevated triglycerides  . Ischemic cardiomyopathy    EF 30-35% // Echo 11/2018: Anteroseptal and anterior hypokinesis, EF 30-35, small circumferential pericardial effusion, no intracardiac thrombi   . Presence of drug coated stent in LAD coronary artery 10-23-18   Ostial LAD DES PCI: Resolute Onyx 3.5 mm x 18  mm - 3.6 mm  . STEMI (ST elevation myocardial infarction) ([place]) 10/12/2018    Past Surgical History:  Procedure Laterality Date  . CARDIAC CATHETERIZATION    . CORONARY/GRAFT ACUTE MI REVASCULARIZATION N/A 10/12/2018   Procedure: CORONARY/GRAFT ACUTE MI REVASCULARIZATION;  Surgeon: [name], MD;  Location: [facility];  Service: Cardiovascular;  Laterality: N/A;  . LEFT HEART CATH AND CORONARY ANGIOGRAPHY N/A 10/12/2018   Procedure: LEFT HEART CATH AND CORONARY ANGIOGRAPHY;  Surgeon: [name], MD;  Location: [facility];  Service: Cardiovascular;  Laterality: N/A;  . RIGHT/LEFT HEART CATH AND CORONARY ANGIOGRAPHY N/A 11/13/2018   Procedure: RIGHT/LEFT HEART CATH AND CORONARY ANGIOGRAPHY;  Surgeon: [name], MD;  Location: [facility];  Service: Cardiovascular;  Laterality: N/A;    Current Medications: Current Meds  Medication Sig  . aspirin 81 MG chewable tablet Chew 1 tablet (81 mg total) by mouth daily.  . Evolocumab (REPATHA SURECLICK) 381 MG/ML SOAJ Inject 1 pen into the skin every 14 (fourteen) days.  . metoprolol succinate (TOPROL-XL) 25 MG 24 hr tablet Take 0.5 tablets (12.5 mg total) by mouth daily.  . nitroGLYCERIN (NITROSTAT) 0.4 MG SL tablet Place 1 tablet (0.4 mg total) under the tongue every 5 (five) minutes x 3 doses as needed for chest pain.  . pantoprazole (PROTONIX) 40 MG tablet Take 1 tablet (40 mg total) by mouth daily.  . sacubitril-valsartan (ENTRESTO) 49-51 MG Take 1 tablet by mouth 2 (two) times daily.   Marland Kitchen  ticagrelor (BRILINTA) 90 MG TABS tablet Take 1 tablet (90 mg total) by mouth 2 (two) times daily.  . [DISCONTINUED] VASCEPA 1 g capsule Take 2 capsules (2 g total) by mouth 2 (two) times daily.     Allergies:   Patient has no known allergies.   Social History   Socioeconomic History  . Marital status: Married    Spouse name: Not on file  . Number of children: Not on file  . Years of education: 48  . Highest education  level: Not on file  Occupational History  . Occupation: Primary school teacher: [employer]   Tobacco Use  . Smoking status: Never Smoker  . Smokeless tobacco: Never Used  Substance and Sexual Activity  . Alcohol use: Yes    Alcohol/week: 2.0 standard drinks    Types: 2 Cans of beer per week    Comment: 2 packs/month   . Drug use: Never  . Sexual activity: Not on file  Other Topics Concern  . Not on file  Social History Narrative   Patient lives with wife in [place] and small child   Works in Consulting civil engineer for [employer]   Social Determinants of Health   Financial Resource Strain: Low Risk   . Difficulty of Paying Living Expenses: Not hard at all  Food Insecurity: No Food Insecurity  . Worried About Programme researcher, broadcasting/film/video in the Last Year: Never true  . Ran Out of Food in the Last Year: Never true  Transportation Needs: No Transportation Needs  . Lack of Transportation (Medical): No  . Lack of Transportation (Non-Medical): No  Physical Activity: Sufficiently Active  . Days of Exercise per Week: 4 days  . Minutes of Exercise per Session: 40 min  Stress: No Stress Concern Present  . Feeling of Stress : Only a little  Social Connections:   . Frequency of Communication with Friends and Family:   . Frequency of Social Gatherings with Friends and Family:   . Attends Religious Services:   . Active Member of Clubs or Organizations:   . Attends Banker Meetings:   Marland Kitchen Marital Status:      Family History: The patient's family history includes Diabetes in his brother; Thyroid disease in his brother and mother. There is no history of Heart disease.  ROS:   Please see the history of present illness.    Appetite is stable.  He has not needed nitroglycerin.  No sustained tachycardia.  All other systems reviewed and are negative.  EKGs/Labs/Other Studies Reviewed:    The following studies were reviewed today:  Cardiac MRI November 2020:  CLINICAL DATA:  Ischemic  cardiomyopathy  EXAM: CARDIAC MRI  TECHNIQUE: The patient was scanned on a 1.5 Tesla GE magnet. A dedicated cardiac coil was used. Functional imaging was done using Fiesta sequences. 2,3, and 4 chamber views were done to assess for RWMA's. Modified Simpson's rule using a short axis stack was used to calculate an ejection fraction on a dedicated work Research officer, [employer]. The patient received 8 cc of Gadavist. After 10 minutes inversion recovery sequences were used to assess for infiltration and scar tissue.  FINDINGS: Limited images of the lung fields showed no gross abnormalities.  Small pericardial effusion primarily inferiorly. Normal left ventricular size and wall thickness. Akinetic anteroseptal wall, mid-apical anterior wall, apical lateral wall, apical inferior wall, and true apex. EF 34%. Normal right ventricular size and systolic function, EF 60%. Normal left atrial size. Normal right atrial size. Trileaflet  aortic valve with no significant stenosis or regurgitation.  Delayed enhancement imaging:  76-99% wall thickness subendocardial late gadolinium enhancement (LGE) throughout the entire anteroseptal wall, the mid to apical anterior wall, and the apical inferior wall.  Full thickness scare at the true apex.  51-75% wall thickness subendocardial LGE mid to apical anterolateral wall.  Measurements:  LVEDV 126 mL  LVSV 43 mL  LVEF 34%  RVEDV 72 mL  RVSV 46 mL  RVEF 64%  IMPRESSION: 1. Normal LV size with EF 34%, wall motion abnormalities in LAD infarction pattern as noted above.  2.  Normal RV size and systolic function, EF 60%.  3. Dense LAD territory scar, this myocardium is unlikely to be viable.  ECHOCARDIOGRAM 11/2018: IMPRESSIONS    1. Mild dyskinesis of the left ventricular, entire apical segment.  2. Severe hypokinesis of the left ventricular, entire anteroseptal wall  and anterior wall.  3. The left  ventricle has moderate-severely reduced systolic function,  with an ejection fraction of 30-35%. The cavity size was normal. reduced  annular diastolic velocities for age.  4. Small circumferential pericardial effusion.  5. The aorta is normal unless otherwise noted.  6. No intracardiac thrombi or masses were visualized.   EKG:  EKG not performed today.  Recent Labs: 10/13/2018: B Natriuretic Peptide 325.1; TSH 3.547 10/14/2018: Magnesium 2.1 11/12/2018: Platelets 213 11/13/2018: Hemoglobin 11.9 12/29/2018: BUN 11; Creatinine, Ser 1.15; Potassium 4.5; Sodium 141 05/21/2019: ALT 9  Recent Lipid Panel    Component Value Date/Time   CHOL 172 05/21/2019 0838   TRIG 270 (H) 05/21/2019 0838   HDL 34 (L) 05/21/2019 0838   CHOLHDL 5.1 (H) 05/21/2019 0838   CHOLHDL 4.6 10/13/2018 0317   VLDL 35 10/13/2018 0317   LDLCALC 93 05/21/2019 0838    Physical Exam:    VS:  BP 94/66   Pulse 90   Ht 5\' 6"  (1.676 m)   Wt 119 lb 12.8 oz (54.3 kg)   SpO2 99%   BMI 19.34 kg/m     Wt Readings from Last 3 Encounters:  07/16/19 119 lb 12.8 oz (54.3 kg)  03/14/19 123 lb (55.8 kg)  03/07/19 119 lb 11.4 oz (54.3 kg)     GEN: Healthy-appearing. No acute distress HEENT: Normal NECK: No JVD. LYMPHATICS: No lymphadenopathy CARDIAC: No heave or dyskinesis of the apex.  RRR without murmur, gallop, or edema. VASCULAR:  Normal Pulses. No bruits. RESPIRATORY:  Clear to auscultation without rales, wheezing or rhonchi  ABDOMEN: Soft, non-tender, non-distended, No pulsatile mass, MUSCULOSKELETAL: No deformity  SKIN: Warm and dry NEUROLOGIC:  Alert and oriented x 3 PSYCHIATRIC:  Normal affect   ASSESSMENT:    1. Coronary artery disease involving native coronary artery of native heart with angina pectoris (HCC)   2. Chronic systolic CHF (congestive heart failure) (HCC)   3. 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 Essential hypertension   5. Ischemic cardiomyopathy   6. Educated  about COVID-19 virus infection   7. Hyperlipidemia with target LDL less than 70    PLAN:    In order of problems listed above:  1. Ostial LAD stent in the setting of acute anterior myocardial infarction.  Asymptomatic with reference to angina. 2. Significant LV systolic dysfunction as his anterior infarction was late presenting.  Echocardiography performed 6 months ago revealing EF of 30 to 35%.  He deferred consideration of AICD.  His echocardiogram should be repeated again in September 2021. 3. He may need to have  coiling of his pulmonary artery to coronary artery fistulae. 4. Blood pressure is relatively low but asymptomatic.  Continue Entresto, Toprol-XL, and close clinical follow-up.  And add on should probably be a mineralocorticoid antagonist, spironolactone, 12.5 mg/day.  He was not willing to accept additional medication added on at this time. 5. Ischemic cardiomyopathy with large scar burden and EF less than 35%. 6. COVID-19 vaccination is excepted.  We will help him schedule I to be done within the next 2 weeks. 7. Continue Repatha.  Discontinue Vascepa.  Vascepa is causing perceived side effects even at 1 g twice daily.  Guideline directed therapy for left ventricular systolic dysfunction: Angiotensin receptor-neprilysin inhibitor (ARNI)-Entresto; beta-blocker therapy - carvedilol or metoprolol succinate; mineralocorticoid receptor antagonist (MRA) therapy -spironolactone or eplerenone.  These therapies have been shown to improve clinical outcomes including reduction of rehospitalization survival, and acute heart failure.    Medication Adjustments/Labs and Tests Ordered: Current medicines are reviewed at length with the patient today.  Concerns regarding medicines are outlined above.  No orders of the defined types were placed in this encounter.  No orders of the defined types were placed in this encounter.   Patient Instructions  Medication Instructions:  1) DISCONTINUE  Vascepa  *If you need a refill on your cardiac medications before your next appointment, please call your pharmacy*   Lab Work: None If you have labs (blood work) drawn today and your tests are completely normal, you will receive your results only by: Marland Kitchen MyChart Message (if you have MyChart) OR . A paper copy in the mail If you have any lab test that is abnormal or we need to change your treatment, we will call you to review the results.   Testing/Procedures:  You will need to get an echocardiogram in September or October.  Establish with a Cardiologist as soon as possible when you move and make them aware of this.   Follow-Up: We recommend that you find a Cardiologist as soon as possible when you move.  Other Instructions      Signed, Lesleigh Noe, MD  07/16/2019 9:24 AM    [facility]

## 2019-07-16 ENCOUNTER — Other Ambulatory Visit: Payer: Self-pay

## 2019-07-16 ENCOUNTER — Ambulatory Visit (INDEPENDENT_AMBULATORY_CARE_PROVIDER_SITE_OTHER): Payer: BC Managed Care – PPO | Admitting: Interventional Cardiology

## 2019-07-16 ENCOUNTER — Other Ambulatory Visit (INDEPENDENT_AMBULATORY_CARE_PROVIDER_SITE_OTHER): Payer: BC Managed Care – PPO | Admitting: *Deleted

## 2019-07-16 ENCOUNTER — Encounter: Payer: Self-pay | Admitting: Interventional Cardiology

## 2019-07-16 VITALS — BP 94/66 | HR 90 | Ht 66.0 in | Wt 119.8 lb

## 2019-07-16 DIAGNOSIS — I10 Essential (primary) hypertension: Secondary | ICD-10-CM

## 2019-07-16 DIAGNOSIS — Q245 Malformation of coronary vessels: Secondary | ICD-10-CM

## 2019-07-16 DIAGNOSIS — I25119 Atherosclerotic heart disease of native coronary artery with unspecified angina pectoris: Secondary | ICD-10-CM | POA: Diagnosis not present

## 2019-07-16 DIAGNOSIS — Z7189 Other specified counseling: Secondary | ICD-10-CM

## 2019-07-16 DIAGNOSIS — I255 Ischemic cardiomyopathy: Secondary | ICD-10-CM

## 2019-07-16 DIAGNOSIS — I5022 Chronic systolic (congestive) heart failure: Secondary | ICD-10-CM

## 2019-07-16 DIAGNOSIS — E785 Hyperlipidemia, unspecified: Secondary | ICD-10-CM | POA: Diagnosis not present

## 2019-07-16 LAB — HEPATIC FUNCTION PANEL
ALT: 16 IU/L (ref 0–44)
AST: 16 IU/L (ref 0–40)
Albumin: 4.6 g/dL (ref 4.0–5.0)
Alkaline Phosphatase: 76 IU/L (ref 39–117)
Bilirubin Total: 0.7 mg/dL (ref 0.0–1.2)
Bilirubin, Direct: 0.23 mg/dL (ref 0.00–0.40)
Total Protein: 6.8 g/dL (ref 6.0–8.5)

## 2019-07-16 LAB — LIPID PANEL
Chol/HDL Ratio: 1.9 ratio (ref 0.0–5.0)
Cholesterol, Total: 74 mg/dL — ABNORMAL LOW (ref 100–199)
HDL: 40 mg/dL (ref >39)
LDL Chol Calc (NIH): 14 mg/dL (ref 0–99)
Triglycerides: 103 mg/dL (ref 0–149)
VLDL Cholesterol Cal: 20 mg/dL (ref 5–40)

## 2019-07-16 NOTE — Patient Instructions (Signed)
Medication Instructions:  1) DISCONTINUE Vascepa  *If you need a refill on your cardiac medications before your next appointment, please call your pharmacy*   Lab Work: None If you have labs (blood work) drawn today and your tests are completely normal, you will receive your results only by: Marland Kitchen MyChart Message (if you have MyChart) OR . A paper copy in the mail If you have any lab test that is abnormal or we need to change your treatment, we will call you to review the results.   Testing/Procedures:  You will need to get an echocardiogram in September or October.  Establish with a Cardiologist as soon as possible when you move and make them aware of this.   Follow-Up: We recommend that you find a Cardiologist as soon as possible when you move.  Other Instructions

## 2019-07-17 ENCOUNTER — Telehealth: Payer: Self-pay | Admitting: Pharmacist

## 2019-07-17 DIAGNOSIS — E785 Hyperlipidemia, unspecified: Secondary | ICD-10-CM

## 2019-07-17 NOTE — Telephone Encounter (Signed)
Called pt to discuss lipid panel results. Excellent response in LDL reduction after starting Repatha. TG have dropped too since starting Vascepa. Pt states he stopped this yesterday since it was causing his heart to race (there is a low potential for afib with this med). He will continue on Repatha injections but would like [place] to recheck his cholesterol in 1 month as he is concerned for LDL being too low. Discussed that there has been no negative data seen with LDL in the teens, just better prevention of MI and stroke. Will add a direct LDL as well as lipid panel since his TG will likely increase off of Vascepa.

## 2019-07-20 ENCOUNTER — Ambulatory Visit: Payer: BC Managed Care – PPO | Attending: Internal Medicine

## 2019-07-20 DIAGNOSIS — Z23 Encounter for immunization: Secondary | ICD-10-CM

## 2019-07-20 NOTE — Progress Notes (Signed)
   Covid-19 Vaccination Clinic  Name:  Kristi Hyer    MRN: [id number] DOB: 1980-11-24  07/20/2019  Mr. Cryan was observed post Covid-19 immunization for 15 minutes without incident. He was provided with Vaccine Information Sheet and instruction to access the V-Safe system.   Mr. Garriga was instructed to call 911 with any severe reactions post vaccine: Marland Kitchen Difficulty breathing  . Swelling of face and throat  . A fast heartbeat  . A bad rash all over body  . Dizziness and weakness   Immunizations Administered    Name Date Dose VIS Date Route   Pfizer COVID-19 Vaccine 07/20/2019  2:22 PM 0.3 mL 05/16/2018 Intramuscular   Manufacturer: [employer], [employer]   Lot: [id number]   NDC: [address]-4

## 2019-08-13 ENCOUNTER — Ambulatory Visit: Payer: BC Managed Care – PPO

## 2019-08-16 ENCOUNTER — Other Ambulatory Visit: Payer: Self-pay | Admitting: Interventional Cardiology

## 2019-08-18 ENCOUNTER — Ambulatory Visit: Payer: BC Managed Care – PPO | Attending: Internal Medicine

## 2019-08-18 DIAGNOSIS — Z23 Encounter for immunization: Secondary | ICD-10-CM

## 2019-08-18 NOTE — Progress Notes (Signed)
   Covid-19 Vaccination Clinic  Name:  James [name]    MRN: [id number] DOB: [birth year]  08/18/2019  [name] was observed post Covid-19 immunization for 15 minutes without incident. He was provided with Vaccine Information Sheet and instruction to access the V-Safe system.   [name] was instructed to call 911 with any severe reactions post vaccine: Marland Kitchen Difficulty breathing  . Swelling of face and throat  . A fast heartbeat  . A bad rash all over body  . Dizziness and weakness   Immunizations Administered    Name Date Dose VIS Date Route   Pfizer COVID-19 Vaccine 08/18/2019 11:52 AM 0.3 mL 05/16/2018 Intramuscular   Manufacturer: [employer], [employer]   Lot: [id number]   NDC: [address]-4

## 2019-08-21 ENCOUNTER — Other Ambulatory Visit: Payer: Self-pay

## 2019-08-21 ENCOUNTER — Other Ambulatory Visit (INDEPENDENT_AMBULATORY_CARE_PROVIDER_SITE_OTHER): Payer: BC Managed Care – PPO | Admitting: *Deleted

## 2019-08-21 ENCOUNTER — Other Ambulatory Visit: Payer: BC Managed Care – PPO

## 2019-08-21 DIAGNOSIS — E785 Hyperlipidemia, unspecified: Secondary | ICD-10-CM

## 2019-08-21 LAB — LIPID PANEL
Chol/HDL Ratio: 2.1 ratio (ref 0.0–5.0)
Cholesterol, Total: 90 mg/dL — ABNORMAL LOW (ref 100–199)
HDL: 43 mg/dL (ref >39)
LDL Chol Calc (NIH): 31 mg/dL (ref 0–99)
Triglycerides: 76 mg/dL (ref 0–149)
VLDL Cholesterol Cal: 16 mg/dL (ref 5–40)

## 2019-08-21 LAB — LDL CHOLESTEROL, DIRECT: LDL Direct: 35 mg/dL (ref 0–99)

## 2019-09-14 DIAGNOSIS — I25118 Atherosclerotic heart disease of native coronary artery with other forms of angina pectoris: Secondary | ICD-10-CM | POA: Diagnosis not present

## 2019-09-14 DIAGNOSIS — I10 Essential (primary) hypertension: Secondary | ICD-10-CM | POA: Diagnosis not present

## 2019-09-14 DIAGNOSIS — L290 Pruritus ani: Secondary | ICD-10-CM | POA: Diagnosis not present

## 2019-09-14 DIAGNOSIS — E785 Hyperlipidemia, unspecified: Secondary | ICD-10-CM | POA: Diagnosis not present

## 2019-10-16 ENCOUNTER — Ambulatory Visit: Payer: BC Managed Care – PPO | Admitting: Registered Nurse

## 2019-10-31 ENCOUNTER — Telehealth: Payer: Self-pay | Admitting: Radiology

## 2019-10-31 ENCOUNTER — Ambulatory Visit (INDEPENDENT_AMBULATORY_CARE_PROVIDER_SITE_OTHER): Payer: BC Managed Care – PPO | Admitting: Interventional Cardiology

## 2019-10-31 ENCOUNTER — Other Ambulatory Visit: Payer: Self-pay

## 2019-10-31 ENCOUNTER — Encounter: Payer: Self-pay | Admitting: Interventional Cardiology

## 2019-10-31 VITALS — BP 96/60 | HR 80 | Ht 65.0 in | Wt 118.0 lb

## 2019-10-31 DIAGNOSIS — I25119 Atherosclerotic heart disease of native coronary artery with unspecified angina pectoris: Secondary | ICD-10-CM | POA: Diagnosis not present

## 2019-10-31 DIAGNOSIS — R002 Palpitations: Secondary | ICD-10-CM

## 2019-10-31 DIAGNOSIS — I5022 Chronic systolic (congestive) heart failure: Secondary | ICD-10-CM

## 2019-10-31 NOTE — Patient Instructions (Signed)
Medication Instructions:  Your physician recommends that you continue on your current medications as directed. Please refer to the Current Medication list given to you today.  *If you need a refill on your cardiac medications before your next appointment, please call your pharmacy*   Lab Work: None If you have labs (blood work) drawn today and your tests are completely normal, you will receive your results only by: Marland Kitchen MyChart Message (if you have MyChart) OR . A paper copy in the mail If you have any lab test that is abnormal or we need to change your treatment, we will call you to review the results.   Testing/Procedures: Your physician has requested that you have an echocardiogram. Echocardiography is a painless test that uses sound waves to create images of your heart. It provides your doctor with information about the size and shape of your heart and how well your heart's chambers and valves are working. This procedure takes approximately one hour. There are no restrictions for this procedure.  Your physician has recommended that you wear an event monitor. Event monitors are medical devices that record the heart's electrical activity. Doctors most often [place] these monitors to diagnose arrhythmias. Arrhythmias are problems with the speed or rhythm of the heartbeat. The monitor is a small, portable device. You can wear one while you do your normal daily activities. This is usually used to diagnose what is causing palpitations/syncope (passing out).    Follow-Up: At [facility], you and your health needs are our priority.  As part of our continuing mission to provide you with exceptional heart care, we have created designated Provider Care Teams.  These Care Teams include your primary Cardiologist (physician) and Advanced Practice Providers (APPs -  Physician Assistants and Nurse Practitioners) who all work together to provide you with the care you need, when you need it.  We recommend  signing up for the patient portal called "MyChart".  Sign up information is provided on this After Visit Summary.  MyChart is used to connect with patients for Virtual Visits (Telemedicine).  Patients are able to view lab/test results, encounter notes, upcoming appointments, etc.  Non-urgent messages can be sent to your provider as well.   To learn more about what you can do with MyChart, go to [email or web address].    Your next appointment:   6 month(s)  The format for your next appointment:   In Person  Provider:   You may see Lesleigh Noe, MD or one of the following Advanced Practice Providers on your designated Care Team:    Norma Fredrickson, NP  Nada Boozer, NP  Georgie Chard, NP    Other Instructions

## 2019-10-31 NOTE — Telephone Encounter (Signed)
Enrolled patient for a 30 day [employer] Event monitor to be mailed to patients home.  

## 2019-10-31 NOTE — Progress Notes (Signed)
Cardiology Office Note:    Date:  10/31/2019   ID:  James [name], DOB Aug 02, 1980, MRN [id number]  PCP:  Patient, No Pcp Per  Cardiologist:  Lesleigh Noe, MD   Referring MD: No ref. provider found   Chief Complaint  Patient presents with  . Coronary Artery Disease  . Congestive Heart Failure    History of Present Illness:    James [name] is a 39 y.o. 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  5 hours after that he felt the sensation of his heart pounding.  He was not dizzy, lightheaded, and denies shortness of breath.  Since then he has done well.  He has not needed nitroglycerin.  He denies orthopnea, PND, lower extremity swelling, and bleeding.  Past Medical History:  Diagnosis Date  . Acute combined systolic and diastolic heart failure (HCC) 2018-11-06  . Acute ST elevation myocardial infarction (STEMI) (HCC) 10/12/2018  . At risk for sudden cardiac death Nov 06, 2018  . Cardiomyopathy, ischemic 11/14/2018  . CHF (congestive heart failure) (HCC)   . 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 Coronary artery disease involving native coronary artery of native heart with unstable angina pectoris (HCC) Nov 06, 2018  . Delayed presentation of acute anterolateral STEMI 10/12/2018  . Hyperlipidemia    possible elevated triglycerides  . Ischemic cardiomyopathy    EF 30-35% // Echo 11/2018: Anteroseptal and anterior hypokinesis, EF 30-35, small circumferential pericardial effusion, no intracardiac thrombi   . Presence of drug coated stent in LAD coronary artery 06-Nov-2018   Ostial LAD DES PCI: Resolute Onyx 3.5 mm x 18 mm - 3.6 mm  .  STEMI (ST elevation myocardial infarction) (HCC) 10/12/2018    Past Surgical History:  Procedure Laterality Date  . CARDIAC CATHETERIZATION    . CORONARY/GRAFT ACUTE MI REVASCULARIZATION N/A 10/12/2018   Procedure: CORONARY/GRAFT ACUTE MI REVASCULARIZATION;  Surgeon: Lyn Records, MD;  Location: [facility]VASIVE CV LAB;  Service: Cardiovascular;  Laterality: N/A;  . LEFT HEART CATH AND CORONARY ANGIOGRAPHY N/A 10/12/2018   Procedure: LEFT HEART CATH AND CORONARY ANGIOGRAPHY;  Surgeon: Lyn Records, MD;  Location: [facility] INVASIVE CV LAB;  Service: Cardiovascular;  Laterality: N/A;  . RIGHT/LEFT HEART CATH AND CORONARY ANGIOGRAPHY N/A 11/13/2018   Procedure: RIGHT/LEFT HEART CATH AND CORONARY ANGIOGRAPHY;  Surgeon: Yvonne Kendall, MD;  Location: [facility] INVASIVE CV LAB;  Service: Cardiovascular;  Laterality: N/A;    Current Medications: Current Meds  Medication Sig  . aspirin 81 MG chewable tablet Chew 1 tablet (81 mg total) by mouth daily.  . Evolocumab (REPATHA SURECLICK) 140 MG/ML SOAJ Inject 1 pen into the skin every 14 (fourteen) days.  . metoprolol succinate (TOPROL-XL) 25 MG 24 hr tablet TAKE 1/2 TABLET BY MOUTH EVERY DAY  . nitroGLYCERIN (NITROSTAT) 0.4 MG SL tablet Place 1 tablet (0.4 mg total) under the tongue every 5 (five) minutes x 3 doses as needed for chest pain.  . pantoprazole (PROTONIX) 40 MG tablet Take 1 tablet (40 mg total) by mouth daily.  . sacubitril-valsartan (ENTRESTO) 49-51 MG Take 1 tablet by mouth 2 (two) times daily.   . ticagrelor (BRILINTA) 90 MG TABS tablet Take 1 tablet (90  mg total) by mouth 2 (two) times daily.     Allergies:   Vascepa [icosapent ethyl]   Social History   Socioeconomic History  . Marital status: Married    Spouse name: Not on file  . Number of children: Not on file  . Years of education: 57  . Highest education level: Not on file  Occupational History  . Occupation: Primary school teacher: [employer]   Tobacco Use  . Smoking status: Never Smoker    . Smokeless tobacco: Never Used  Substance and Sexual Activity  . Alcohol use: Yes    Alcohol/week: 2.0 standard drinks    Types: 2 Cans of beer per week    Comment: 2 packs/month   . Drug use: Never  . Sexual activity: Not on file  Other Topics Concern  . Not on file  Social History Narrative   Patient lives with wife in [place] and small child   Works in Consulting civil engineer for [employer]   Social Determinants of Health   Financial Resource Strain: Low Risk   . Difficulty of Paying Living Expenses: Not hard at all  Food Insecurity: No Food Insecurity  . Worried About Programme researcher, broadcasting/film/video in the Last Year: Never true  . Ran Out of Food in the Last Year: Never true  Transportation Needs: No Transportation Needs  . Lack of Transportation (Medical): No  . Lack of Transportation (Non-Medical): No  Physical Activity: Sufficiently Active  . Days of Exercise per Week: 4 days  . Minutes of Exercise per Session: 40 min  Stress: No Stress Concern Present  . Feeling of Stress : Only a little  Social Connections:   . Frequency of Communication with Friends and Family:   . Frequency of Social Gatherings with Friends and Family:   . Attends Religious Services:   . Active Member of Clubs or Organizations:   . Attends Banker Meetings:   Marland Kitchen Marital Status:      Family History: The patient's family history includes Diabetes in his brother; Thyroid disease in his brother and mother. There is no history of Heart disease.  ROS:   Please see the history of present illness.    Has anxiety about having children in the future because of his cardiac medications.  We discussed that this will be an issue with his wife had his problems and had remain on the current medications after conception.  There is no real concern from the standpoint of his being able to safely fertilizer over him without concern of harm from medications to his fetus.  All other systems reviewed and are  negative.  EKGs/Labs/Other Studies Reviewed:    The following studies were reviewed today: No new imaging data.  EKG:  EKG normal sinus rhythm, left anterior hemiblock, extensive anterior Q waves V1 through V5.  Low voltage.  Recent Labs: 11/12/2018: Platelets 213 11/13/2018: Hemoglobin 11.9 12/29/2018: BUN 11; Creatinine, Ser 1.15; Potassium 4.5; Sodium 141 07/16/2019: ALT 16  Recent Lipid Panel    Component Value Date/Time   CHOL 90 (L) 08/21/2019 0844   TRIG 76 08/21/2019 0844   HDL 43 08/21/2019 0844   CHOLHDL 2.1 08/21/2019 0844   CHOLHDL 4.6 10/13/2018 0317   VLDL 35 10/13/2018 0317   LDLCALC 31 08/21/2019 0844   LDLDIRECT 35 08/21/2019 0844    Physical Exam:    VS:  BP 96/60   Pulse 80   Ht 5\' 5"  (1.651 m)   Wt 118 lb (53.5 kg)  BMI 19.64 kg/m     Wt Readings from Last 3 Encounters:  10/31/19 118 lb (53.5 kg)  07/16/19 119 lb 12.8 oz (54.3 kg)  03/14/19 123 lb (55.8 kg)     GEN: Healthy-appearing. No acute distress HEENT: Normal NECK: No JVD. LYMPHATICS: No lymphadenopathy CARDIAC:  RRR without murmur, gallop, or edema. VASCULAR:  Normal Pulses. No bruits. RESPIRATORY:  Clear to auscultation without rales, wheezing or rhonchi  ABDOMEN: Soft, non-tender, non-distended, No pulsatile mass, MUSCULOSKELETAL: No deformity  SKIN: Warm and dry NEUROLOGIC:  Alert and oriented x 3 PSYCHIATRIC:  Normal affect   ASSESSMENT:    1. Coronary artery disease involving native coronary artery of native heart with angina pectoris (HCC)   2. Chronic systolic CHF (congestive heart failure) (HCC)   3. Palpitations    PLAN:    In order of problems listed above:  1. Secondary prevention discussed.  Will now nearly 1 year out from stenting of the proximal LAD/left main.  He has had no recurrent anginal symptoms.  I will continue aspirin and Brilinta for at least 6 additional months and at that time consider switching to clopidogrel monotherapy.  Continue aggressive  secondary prevention. 2. 2D Doppler echocardiogram will be performed to see if there is further improvement in LV function on Entresto Toprol combination. 3. 30-day monitor to rule out A. fib and ventricular arrhythmias. 4. COVID-19 vaccine has been received.  Overall education and awareness concerning primary/secondary risk prevention was discussed in detail: LDL less than 70, hemoglobin A1c less than 7, blood pressure target less than 130/80 mmHg, >150 minutes of moderate aerobic activity per week, avoidance of smoking, weight control (via diet and exercise), and continued surveillance/management of/for obstructive sleep apnea.  Guideline directed therapy for left ventricular systolic dysfunction: Angiotensin receptor-neprilysin inhibitor (ARNI)-Entresto; beta-blocker therapy - carvedilol, metoprolol succinate, or bisoprolol; mineralocorticoid receptor antagonist (MRA) therapy -spironolactone or eplerenone.  SGLT-2 agents -  Dapagliflozin James [name]) or Empagliflozin (Jardiance).These therapies have been shown to improve clinical outcomes including reduction of rehospitalization, survival, and acute heart failure.     Medication Adjustments/Labs and Tests Ordered: Current medicines are reviewed at length with the patient today.  Concerns regarding medicines are outlined above.  Orders Placed This Encounter  Procedures  . Cardiac event monitor  . EKG 12-Lead  . ECHOCARDIOGRAM COMPLETE   No orders of the defined types were placed in this encounter.   There are no Patient Instructions on file for this visit.   Signed, Lesleigh Noe, MD  10/31/2019 9:56 AM    [facility]

## 2019-11-13 ENCOUNTER — Ambulatory Visit (HOSPITAL_COMMUNITY): Payer: BC Managed Care – PPO | Attending: Cardiology

## 2019-11-13 ENCOUNTER — Other Ambulatory Visit: Payer: Self-pay

## 2019-11-13 DIAGNOSIS — I5022 Chronic systolic (congestive) heart failure: Secondary | ICD-10-CM | POA: Diagnosis not present

## 2019-11-13 LAB — ECHOCARDIOGRAM COMPLETE
Area-P 1/2: 4.89 cm2
S' Lateral: 4.15 cm

## 2019-11-13 MED ORDER — PERFLUTREN LIPID MICROSPHERE
1.0000 mL | INTRAVENOUS | Status: AC | PRN
  Administered 2019-11-13: 1 mL via INTRAVENOUS

## 2019-11-18 ENCOUNTER — Other Ambulatory Visit: Payer: Self-pay | Admitting: Interventional Cardiology

## 2019-11-19 ENCOUNTER — Other Ambulatory Visit: Payer: Self-pay

## 2019-11-19 ENCOUNTER — Telehealth (INDEPENDENT_AMBULATORY_CARE_PROVIDER_SITE_OTHER): Payer: BC Managed Care – PPO | Admitting: Internal Medicine

## 2019-11-19 VITALS — BP 130/70 | HR 67 | Ht 65.0 in | Wt 119.0 lb

## 2019-11-19 DIAGNOSIS — I25119 Atherosclerotic heart disease of native coronary artery with unspecified angina pectoris: Secondary | ICD-10-CM | POA: Diagnosis not present

## 2019-11-19 DIAGNOSIS — I5022 Chronic systolic (congestive) heart failure: Secondary | ICD-10-CM

## 2019-11-19 MED ORDER — TICAGRELOR 90 MG PO TABS: 90.0000 mg | ORAL_TABLET | Freq: Two times a day (BID) | ORAL | 3 refills | Status: DC

## 2019-11-19 NOTE — Progress Notes (Signed)
Electrophysiology TeleHealth Note   Due to national recommendations of social distancing due to COVID 19, an audio/video telehealth visit is felt to be most appropriate for this patient at this time.  See MyChart message from today for the patient's consent to telehealth for [facility].  Date:  11/19/2019   ID:  James [name], DOB 12-28-1980, MRN [id number]  Location: patient's home  Provider location:  [place]  Evaluation Performed: Follow-up visit  PCP:  Patient, No Pcp Per   Electrophysiologist:  Dr Johney Frame  Chief Complaint:  palpitations  History of Present Illness:    James [name] is a 39 y.o. male who presents via telehealth conferencing today.  Since last being seen in our clinic, the patient reports doing very well.  He has been followed by Dr Katrinka Blazing.  He has been treated with an optimal medical therapy.  His EF remains depressed.  He has some SOB with moderate activity.  On August 3rd, when wlking at increased rate, he became weak and fatigued. Today, he denies symptoms of palpitations, chest pain, shortness of breath,  lower extremity edema, dizziness, presyncope, or syncope.  The patient is otherwise without complaint today.   Past Medical History:  Diagnosis Date  . Acute combined systolic and diastolic heart failure (HCC) 10/31/18  . Acute ST elevation myocardial infarction (STEMI) (HCC) 10/12/2018  . At risk for sudden cardiac death 2018-10-31  . Cardiomyopathy, ischemic 11/14/2018  . CHF (congestive heart failure) (HCC)   . 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 Coronary artery disease involving native coronary artery of native heart with unstable angina pectoris (HCC) 10-31-18  . Delayed presentation of acute anterolateral STEMI 10/12/2018  . Hyperlipidemia    possible elevated triglycerides  . Ischemic cardiomyopathy    EF 30-35% // Echo 11/2018: Anteroseptal and anterior hypokinesis, EF 30-35, small circumferential  pericardial effusion, no intracardiac thrombi   . Presence of drug coated stent in LAD coronary artery Oct 31, 2018   Ostial LAD DES PCI: Resolute Onyx 3.5 mm x 18 mm - 3.6 mm  . STEMI (ST elevation myocardial infarction) (HCC) 10/12/2018    Past Surgical History:  Procedure Laterality Date  . CARDIAC CATHETERIZATION    . CORONARY/GRAFT ACUTE MI REVASCULARIZATION N/A 10/12/2018   Procedure: CORONARY/GRAFT ACUTE MI REVASCULARIZATION;  Surgeon: Lyn Records, MD;  Location: [facility] INVASIVE CV LAB;  Service: Cardiovascular;  Laterality: N/A;  . LEFT HEART CATH AND CORONARY ANGIOGRAPHY N/A 10/12/2018   Procedure: LEFT HEART CATH AND CORONARY ANGIOGRAPHY;  Surgeon: Lyn Records, MD;  Location: [facility] INVASIVE CV LAB;  Service: Cardiovascular;  Laterality: N/A;  . RIGHT/LEFT HEART CATH AND CORONARY ANGIOGRAPHY N/A 11/13/2018   Procedure: RIGHT/LEFT HEART CATH AND CORONARY ANGIOGRAPHY;  Surgeon: Yvonne Kendall, MD;  Location: [facility] INVASIVE CV LAB;  Service: Cardiovascular;  Laterality: N/A;    Current Outpatient Medications  Medication Sig Dispense Refill  . aspirin 81 MG chewable tablet Chew 1 tablet (81 mg total) by mouth daily. 90 tablet 1  . Evolocumab (REPATHA SURECLICK) 140 MG/ML SOAJ Inject 1 pen into the skin every 14 (fourteen) days. 2 pen 11  . metoprolol succinate (TOPROL-XL) 25 MG 24 hr tablet TAKE 1/2 TABLET BY MOUTH EVERY DAY 45 tablet 3  . nitroGLYCERIN (NITROSTAT) 0.4 MG SL tablet Place 1 tablet (0.4 mg total) under the tongue every 5 (five) minutes x 3 doses as needed for chest pain. 25 tablet 2  . pantoprazole (PROTONIX) 40 MG tablet Take  1 tablet (40 mg total) by mouth daily. 90 tablet 3  . sacubitril-valsartan (ENTRESTO) 49-51 MG Take 1 tablet by mouth 2 (two) times daily.     . ticagrelor (BRILINTA) 90 MG TABS tablet Take 1 tablet (90 mg total) by mouth 2 (two) times daily. 180 tablet 3   No current facility-administered medications for this visit.    Allergies:   Vascepa [icosapent  ethyl]   Social History:  The patient  reports that he has never smoked. He has never used smokeless tobacco. He reports current alcohol use of about 2.0 standard drinks of alcohol per week. He reports that he does not use drugs.   ROS:  Please see the history of present illness.   All other systems are personally reviewed and negative.    Exam:    Vital Signs:  BP 130/70   Pulse 67   Ht 5\' 5"  (1.651 m)   Wt 119 lb (54 kg)   BMI 19.80 kg/m   Well sounding and appearing, alert and conversant, regular work of breathing,  good skin color Eyes- anicteric, neuro- grossly intact, skin- no apparent rash or lesions or cyanosis, mouth- oral mucosa is pink  Labs/Other Tests and Data Reviewed:    Recent Labs: 12/29/2018: BUN 11; Creatinine, Ser 1.15; Potassium 4.5; Sodium 141 07/16/2019: ALT 16   Wt Readings from Last 3 Encounters:  11/19/19 119 lb (54 kg)  10/31/19 118 lb (53.5 kg)  07/16/19 119 lb 12.8 oz (54.3 kg)    Echo 11/13/19- EF 30-35%  ASSESSMENT & PLAN:    1.  Chronic systolic dysfunction The patient has an ischemic CM (EF 30%), NYHA Class II CHF, and CAD.  He is referred by Dr 11/15/19 for risk stratification of sudden death and consideration of ICD implantation.   He is interested in proceeding.  I think that given his young age, he may be best suited for a SubQ ICD rather than a traditional ICD.  I will therefore refer him to Dr Katrinka Blazing for this consideration.   Risks, benefits and potential toxicities for medications prescribed and/or refilled reviewed with patient today.   Follow-up:  Dr Ladona Ridgel at next available time. I will see as needed   Patient Risk:  after full review of this patients clinical status, I feel that they are at moderate risk at this time.  Today, I have spent 15 minutes with the patient with telehealth technology discussing arrhythmia management .    Ladona Ridgel, MD  11/19/2019 10:43 AM     [facility] [address] [place] [phone] (office) [phone] (fax)

## 2019-11-29 ENCOUNTER — Other Ambulatory Visit: Payer: Self-pay

## 2019-11-29 ENCOUNTER — Encounter: Payer: Self-pay | Admitting: Internal Medicine

## 2019-11-29 ENCOUNTER — Ambulatory Visit (INDEPENDENT_AMBULATORY_CARE_PROVIDER_SITE_OTHER): Payer: BC Managed Care – PPO | Admitting: Internal Medicine

## 2019-11-29 DIAGNOSIS — I5022 Chronic systolic (congestive) heart failure: Secondary | ICD-10-CM

## 2019-11-29 NOTE — Progress Notes (Signed)
HPI [name] is referred today by Dr. Johney Frame to consider S-ICD insertion. He is a pleasant 39 yo man with an ICM, s/p late presenting occluded LAD, s/p PCI. He has an EF of 30% despite guideline directed medical therapy. He has class 2 symptoms. He has not had syncope.  Allergies  Allergen Reactions  . Vascepa [Icosapent Ethyl]     Heart racing     Current Outpatient Medications  Medication Sig Dispense Refill  . aspirin 81 MG chewable tablet Chew 1 tablet (81 mg total) by mouth daily. 90 tablet 1  . Evolocumab (REPATHA SURECLICK) 140 MG/ML SOAJ Inject 1 pen into the skin every 14 (fourteen) days. 2 pen 11  . metoprolol succinate (TOPROL-XL) 25 MG 24 hr tablet TAKE 1/2 TABLET BY MOUTH EVERY DAY 45 tablet 3  . nitroGLYCERIN (NITROSTAT) 0.4 MG SL tablet Place 1 tablet (0.4 mg total) under the tongue every 5 (five) minutes x 3 doses as needed for chest pain. 25 tablet 2  . pantoprazole (PROTONIX) 40 MG tablet Take 1 tablet (40 mg total) by mouth daily. 90 tablet 3  . sacubitril-valsartan (ENTRESTO) 49-51 MG Take 1 tablet by mouth 2 (two) times daily.     . ticagrelor (BRILINTA) 90 MG TABS tablet Take 1 tablet (90 mg total) by mouth 2 (two) times daily. 180 tablet 3   No current facility-administered medications for this visit.     Past Medical History:  Diagnosis Date  . Acute combined systolic and diastolic heart failure (HCC) 11-03-18  . Acute ST elevation myocardial infarction (STEMI) (HCC) 10/12/2018  . At risk for sudden cardiac death 11-03-18  . Cardiomyopathy, ischemic 11/14/2018  . CHF (congestive heart failure) (HCC)   . 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 Coronary artery disease involving native coronary artery of native heart with unstable angina pectoris (HCC) 11/03/2018  . Delayed presentation of acute anterolateral STEMI 10/12/2018  . Hyperlipidemia    possible elevated triglycerides  . Ischemic cardiomyopathy    EF 30-35% // Echo  11/2018: Anteroseptal and anterior hypokinesis, EF 30-35, small circumferential pericardial effusion, no intracardiac thrombi   . Presence of drug coated stent in LAD coronary artery 11/03/18   Ostial LAD DES PCI: Resolute Onyx 3.5 mm x 18 mm - 3.6 mm  . STEMI (ST elevation myocardial infarction) (HCC) 10/12/2018    ROS:   All systems reviewed and negative except as noted in the HPI.   Past Surgical History:  Procedure Laterality Date  . CARDIAC CATHETERIZATION    . CORONARY/GRAFT ACUTE MI REVASCULARIZATION N/A 10/12/2018   Procedure: CORONARY/GRAFT ACUTE MI REVASCULARIZATION;  Surgeon: Lyn Records, MD;  Location: [facility] INVASIVE CV LAB;  Service: Cardiovascular;  Laterality: N/A;  . LEFT HEART CATH AND CORONARY ANGIOGRAPHY N/A 10/12/2018   Procedure: LEFT HEART CATH AND CORONARY ANGIOGRAPHY;  Surgeon: Lyn Records, MD;  Location: [facility] INVASIVE CV LAB;  Service: Cardiovascular;  Laterality: N/A;  . RIGHT/LEFT HEART CATH AND CORONARY ANGIOGRAPHY N/A 11/13/2018   Procedure: RIGHT/LEFT HEART CATH AND CORONARY ANGIOGRAPHY;  Surgeon: Yvonne Kendall, MD;  Location: [facility] INVASIVE CV LAB;  Service: Cardiovascular;  Laterality: N/A;     Family History  Problem Relation Age of Onset  . Thyroid disease Mother   . Thyroid disease Brother   . Diabetes Brother   . Heart disease Neg Hx      Social History   Socioeconomic History  . Marital status: Married    Spouse  name: Not on file  . Number of children: Not on file  . Years of education: 32  . Highest education level: Not on file  Occupational History  . Occupation: Primary school teacher: [employer]   Tobacco Use  . Smoking status: Never Smoker  . Smokeless tobacco: Never Used  Substance and Sexual Activity  . Alcohol use: Yes    Alcohol/week: 2.0 standard drinks    Types: 2 Cans of beer per week    Comment: 2 packs/month   . Drug use: Never  . Sexual activity: Not on file  Other Topics Concern  . Not on file  Social History Narrative    Patient lives with wife in [place] and small child   Works in Consulting civil engineer for [employer]   Social Determinants of Health   Financial Resource Strain: Low Risk   . Difficulty of Paying Living Expenses: Not hard at all  Food Insecurity: No Food Insecurity  . Worried About Programme researcher, broadcasting/film/video in the Last Year: Never true  . Ran Out of Food in the Last Year: Never true  Transportation Needs: No Transportation Needs  . Lack of Transportation (Medical): No  . Lack of Transportation (Non-Medical): No  Physical Activity: Sufficiently Active  . Days of Exercise per Week: 4 days  . Minutes of Exercise per Session: 40 min  Stress: No Stress Concern Present  . Feeling of Stress : Only a little  Social Connections:   . Frequency of Communication with Friends and Family: Not on file  . Frequency of Social Gatherings with Friends and Family: Not on file  . Attends Religious Services: Not on file  . Active Member of Clubs or Organizations: Not on file  . Attends Banker Meetings: Not on file  . Marital Status: Not on file  Intimate Partner Violence:   . Fear of Current or Ex-Partner: Not on file  . Emotionally Abused: Not on file  . Physically Abused: Not on file  . Sexually Abused: Not on file     BP 106/66   Pulse 80   Ht 5\' 5"  (1.651 m)   Wt 119 lb 9.6 oz (54.3 kg)   SpO2 98%   BMI 19.90 kg/m   Physical Exam:  Overweight but well appearing NAD HEENT: Unremarkable Neck:  6 cm JVD, no thyromegally Lymphatics:  No adenopathy Back:  No CVA tenderness Lungs:  Clear with no wheezes HEART:  Regular rate rhythm, no murmurs, no rubs, no clicks Abd:  soft, positive bowel sounds, no organomegally, no rebound, no guarding Ext:  2 plus pulses, no edema, no cyanosis, no clubbing Skin:  No rashes no nodules Neuro:  CN II through XII intact, motor grossly intact  DEVICE  Normal device function.  See PaceArt for details.   Assess/Plan: 1. Chronic systolic heart failure - his  symptoms are class 2. He will continue his current meds. 2. ICM - he denies anginal symptoms.  I have discussed the treatment options with the patient. I have recommended he undergo S-ICD insertion for primary prevention. He will call when he would like to proceed. He will need general anesthesia.  [place] Aarsh Fristoe,MD

## 2019-11-29 NOTE — Patient Instructions (Addendum)
Medication Instructions:  Your physician recommends that you continue on your current medications as directed. Please refer to the Current Medication list given to you today.  Labwork: None ordered.  Testing/Procedures: Dr. Ladona Ridgel recommends that you consider a subcutaneous implantable cardiac defibrillator.  [email or web address]  Follow-Up:  Dates available for procedure: October 18, 21, 25 November 2, 4, 8, 18, 22  Any Other Special Instructions Will Be Listed Below (If Applicable).  If you need a refill on your cardiac medications before your next appointment, please call your pharmacy.

## 2019-12-05 DIAGNOSIS — Z136 Encounter for screening for cardiovascular disorders: Secondary | ICD-10-CM | POA: Diagnosis not present

## 2019-12-05 DIAGNOSIS — Z0000 Encounter for general adult medical examination without abnormal findings: Secondary | ICD-10-CM | POA: Diagnosis not present

## 2019-12-05 DIAGNOSIS — Z681 Body mass index (BMI) 19 or less, adult: Secondary | ICD-10-CM | POA: Diagnosis not present

## 2019-12-05 DIAGNOSIS — I10 Essential (primary) hypertension: Secondary | ICD-10-CM | POA: Diagnosis not present

## 2019-12-25 NOTE — Progress Notes (Signed)
Cardiology Office Note:    Date:  12/26/2019   ID:  James [name], DOB 11/26/1980, MRN [id number]  PCP:  Patient, No Pcp Per  Cardiologist:  Lesleigh NoeHenry W Adalia Pettis III, MD   Referring MD: No ref. provider found   Chief Complaint  Patient presents with  . Congestive Heart Failure  . Coronary Artery Disease    History of Present Illness:    James [name] is a 39 y.o. 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 Seen by EP Dr. Ladona Ridgelaylor and considering S-ICD for primary prevention.  He has now decided to undergo subcutaneous ICD by Dr. Ladona Ridgelaylor.  I am not sure of Dr. Ladona Ridgelaylor knows that she had.  He is concerned about continued palpitations.  We gave him a 30-day monitor to wear over a month ago but he is not placed on monitor or started recordings.  I explained to him that this monitor would be very important.  It is possible that he is having a high burden of PVCs that could be holding LVEF down.  If that were the case, perhaps defibrillator would not be needed but attention directed towards PVC ablation/control.  We also discussed the possibility he could have atrial fibrillation.  We have repeatedly discussed the importance of seeing on electrocardiogram what the rhythm disturbance is.  Multiple other vaguely related concerns were discussed.  Generally I feel he should participate in activities as he feels fit.  He should avoid heavy isometric activity.  He should stop if palpitations caused him to feel weak, lightheaded, chest pain, or shortness of breath.  He should seek attention if it is prolonged and causes him to feel threatened.  Past Medical History:  Diagnosis Date  . Acute combined systolic and diastolic heart failure (HCC) 10/13/2018  . Acute ST elevation myocardial infarction (STEMI)  (HCC) 10/12/2018  . At risk for sudden cardiac death 10/13/2018  . Cardiomyopathy, ischemic 11/14/2018  . CHF (congestive heart failure) (HCC)   . 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 Coronary artery disease involving native coronary artery of native heart with unstable angina pectoris (HCC) 10/13/2018  . Delayed presentation of acute anterolateral STEMI 10/12/2018  . Hyperlipidemia    possible elevated triglycerides  . Ischemic cardiomyopathy    EF 30-35% // Echo 11/2018: Anteroseptal and anterior hypokinesis, EF 30-35, small circumferential pericardial effusion, no intracardiac thrombi   . Presence of drug coated stent in LAD coronary artery 10/13/2018   Ostial LAD DES PCI: Resolute Onyx 3.5 mm x 18 mm - 3.6 mm  . STEMI (ST elevation myocardial infarction) (HCC) 10/12/2018    Past Surgical History:  Procedure Laterality Date  . CARDIAC CATHETERIZATION    . CORONARY/GRAFT ACUTE MI REVASCULARIZATION N/A 10/12/2018   Procedure: CORONARY/GRAFT ACUTE MI REVASCULARIZATION;  Surgeon: Lyn RecordsSmith, Stormy Connon W, MD;  [facility];  Service: Cardiovascular;  Laterality: N/A;  . LEFT HEART CATH AND CORONARY ANGIOGRAPHY N/A 10/12/2018   Procedure: LEFT HEART CATH AND CORONARY ANGIOGRAPHY;  Surgeon: Lyn RecordsSmith, Krystyna Cleckley W, MD;  Location: [facility] INVASIVE CV LAB;  Service: Cardiovascular;  Laterality: N/A;  . RIGHT/LEFT HEART CATH AND CORONARY ANGIOGRAPHY N/A 11/13/2018   Procedure: RIGHT/LEFT HEART CATH AND CORONARY ANGIOGRAPHY;  Surgeon: Yvonne KendallEnd, Christopher, MD;  Location: [facility] INVASIVE CV LAB;  Service: Cardiovascular;  Laterality:  N/A;    Current Medications: Current Meds  Medication Sig  . aspirin 81 MG chewable tablet Chew 1 tablet (81 mg total) by mouth daily.  . Evolocumab (REPATHA SURECLICK) 140 MG/ML SOAJ Inject 1 pen into the skin every 14 (fourteen) days.  . metoprolol succinate (TOPROL-XL) 25 MG 24 hr tablet TAKE 1/2 TABLET BY MOUTH EVERY DAY  . nitroGLYCERIN (NITROSTAT) 0.4 MG SL  tablet Place 1 tablet (0.4 mg total) under the tongue every 5 (five) minutes x 3 doses as needed for chest pain.  . pantoprazole (PROTONIX) 40 MG tablet Take 1 tablet (40 mg total) by mouth daily.  . sacubitril-valsartan (ENTRESTO) 49-51 MG Take 1 tablet by mouth 2 (two) times daily.   . ticagrelor (BRILINTA) 90 MG TABS tablet Take 1 tablet (90 mg total) by mouth 2 (two) times daily.     Allergies:   Vascepa [icosapent ethyl]   Social History   Socioeconomic History  . Marital status: Married    Spouse name: Not on file  . Number of children: Not on file  . Years of education: 18  . Highest education level: Not on file  Occupational History  . Occupation: Primary school teacher: [employer]   Tobacco Use  . Smoking status: Never Smoker  . Smokeless tobacco: Never Used  Substance and Sexual Activity  . Alcohol use: Yes    Alcohol/week: 2.0 standard drinks    Types: 2 Cans of beer per week    Comment: 2 packs/month   . Drug use: Never  . Sexual activity: Not on file  Other Topics Concern  . Not on file  Social History Narrative   Patient [place]   Works in Consulting civil engineer for [employer]   Social Determinants of Health   Financial Resource Strain: Low Risk   . Difficulty of Paying Living Expenses: Not hard at all  Food Insecurity: No Food Insecurity  . Worried About Programme researcher, broadcasting/film/video in the Last Year: Never true  . Ran Out of Food in the Last Year: Never true  Transportation Needs: No Transportation Needs  . Lack of Transportation (Medical): No  . Lack of Transportation (Non-Medical): No  Physical Activity: Sufficiently Active  . Days of Exercise per Week: 4 days  . Minutes of Exercise per Session: 40 min  Stress: No Stress Concern Present  . Feeling of Stress : Only a little  Social Connections:   . Frequency of Communication with Friends and Family: Not on file  . Frequency of Social Gatherings with Friends and Family: Not on file  . Attends  Religious Services: Not on file  . Active Member of Clubs or Organizations: Not on file  . Attends Banker Meetings: Not on file  . Marital Status: Not on file     Family History: The patient's family history includes Diabetes in his brother; Thyroid disease in his brother and mother. There is no history of Heart disease.  ROS:   Please see the history of present illness.    Multiple questions were entertained and answered all other systems reviewed and are negative.  EKGs/Labs/Other Studies Reviewed:    The following studies were reviewed today: No new data  EKG:  EKG not performed  Recent Labs: 12/29/2018: BUN 11; Creatinine, Ser 1.15; Potassium 4.5; Sodium 141 07/16/2019: ALT 16  Recent Lipid Panel    Component Value Date/Time   CHOL 90 (L) 08/21/2019 0844   TRIG 76 08/21/2019 0844  HDL 43 08/21/2019 0844   CHOLHDL 2.1 08/21/2019 0844   CHOLHDL 4.6 10/13/2018 0317   VLDL 35 10/13/2018 0317   LDLCALC 31 08/21/2019 0844   LDLDIRECT 35 08/21/2019 0844    Physical Exam:    VS:  BP 106/60   Pulse 90   Ht 5\' 5"  (1.651 m)   Wt 121 lb 6.4 oz (55.1 kg)   SpO2 99%   BMI 20.20 kg/m     Wt Readings from Last 3 Encounters:  12/26/19 121 lb 6.4 oz (55.1 kg)  11/29/19 119 lb 9.6 oz (54.3 kg)  11/19/19 119 lb (54 kg)     GEN: Young healthy-appearing. No acute distress HEENT: Normal NECK: No JVD. LYMPHATICS: No lymphadenopathy CARDIAC:  RRR without murmur, gallop, or edema. VASCULAR:  Normal Pulses. No bruits. RESPIRATORY:  Clear to auscultation without rales, wheezing or rhonchi  ABDOMEN: Soft, non-tender, non-distended, No pulsatile mass, MUSCULOSKELETAL: No deformity  SKIN: Warm and dry NEUROLOGIC:  Alert and oriented x 3 PSYCHIATRIC:  Normal affect   ASSESSMENT:    1. Chronic systolic heart failure (HCC)   2. Coronary artery disease involving native coronary artery of native heart with angina pectoris (HCC)   3. Palpitations   4. Hyperlipidemia  with target LDL less than 70   5. 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 Essential hypertension   7. Educated about COVID-19 virus infection    PLAN:    In order of problems listed above:  1. Systolic heart failure is present.  No symptoms of angina.  Persistent low EF 35% by echo and previously documented to be less than 35% by MRI.  Continue heart failure therapy with Toprol-XL 12.5 mg/day, Entresto 49/51 mg/day.  Low blood pressures have prevented addition of MRA and SGLT2.  He complains of palpitations.  We also need to rule out the possibility that he is having high density PVCs which could be holding down his EF. 2. No anginal symptoms.  Secondary prevention reviewed. 3. Still has not started the continuous monitor.  I am interested in PVC density to help inform the decision concerning pacemaker.  If he has high density PVCs, we may attempt to ablate PVCs or decrease burden to see if this helps improve LVEF before committing him to an ICD. 4. Continue Repatha twice per month. 5. Will need to have a gated cardiac CT to [facility] the coronary pulmonary artery fistula to determine if coiling is possible.  We will have to discuss with interventional radiology. 6. Blood pressure is lowish at 106/60 mmHg today.  Continue current therapy. 7. He is vaccinated and practicing mitigation.  We will see the patient back after his subcu ICD by Dr. [facility].  Hope we can give a monitor results back before the actual implant to make sure that PVC burden is not influencing LV function.  If there is high density, perhaps the treatment measure prior to ICD would be suppression of PVCs.  Medication Adjustments/Labs and Tests Ordered: Current medicines are reviewed at length with the patient today.  Concerns regarding medicines are outlined above.  No orders of the defined types were placed in this encounter.  No orders of the defined types were placed in this encounter.   Patient  Instructions  Medication Instructions:  Your physician recommends that you continue on your current medications as directed. Please refer to the Current Medication list given to you today.  *If you need a refill on your cardiac medications before your next  appointment, please call your pharmacy*   Lab Work: None If you have labs (blood work) drawn today and your tests are completely normal, you will receive your results only by: Marland Kitchen MyChart Message (if you have MyChart) OR . A paper copy in the mail If you have any lab test that is abnormal or we need to change your treatment, we will call you to review the results.   Testing/Procedures: Your physician has recommended that you wear an event monitor. Event monitors are medical devices that record the heart's electrical activity. Doctors most often [place] these monitors to diagnose arrhythmias. Arrhythmias are problems with the speed or rhythm of the heartbeat. The monitor is a small, portable device. You can wear one while you do your normal daily activities. This is usually used to diagnose what is causing palpitations/syncope (passing out).    Follow-Up:  We will see you back sometime after you have your defibrillator placed.    Other Instructions      Signed, Lesleigh Noe, MD  12/26/2019 5:14 PM    [facility]

## 2019-12-26 ENCOUNTER — Other Ambulatory Visit: Payer: Self-pay

## 2019-12-26 ENCOUNTER — Encounter: Payer: Self-pay | Admitting: Interventional Cardiology

## 2019-12-26 ENCOUNTER — Ambulatory Visit (INDEPENDENT_AMBULATORY_CARE_PROVIDER_SITE_OTHER): Payer: BC Managed Care – PPO | Admitting: Interventional Cardiology

## 2019-12-26 VITALS — BP 106/60 | HR 90 | Ht 65.0 in | Wt 121.4 lb

## 2019-12-26 DIAGNOSIS — R002 Palpitations: Secondary | ICD-10-CM

## 2019-12-26 DIAGNOSIS — Q245 Malformation of coronary vessels: Secondary | ICD-10-CM

## 2019-12-26 DIAGNOSIS — E785 Hyperlipidemia, unspecified: Secondary | ICD-10-CM | POA: Diagnosis not present

## 2019-12-26 DIAGNOSIS — I25119 Atherosclerotic heart disease of native coronary artery with unspecified angina pectoris: Secondary | ICD-10-CM

## 2019-12-26 DIAGNOSIS — Z7189 Other specified counseling: Secondary | ICD-10-CM

## 2019-12-26 DIAGNOSIS — I5022 Chronic systolic (congestive) heart failure: Secondary | ICD-10-CM

## 2019-12-26 DIAGNOSIS — I10 Essential (primary) hypertension: Secondary | ICD-10-CM

## 2019-12-26 NOTE — Patient Instructions (Signed)
Medication Instructions:  Your physician recommends that you continue on your current medications as directed. Please refer to the Current Medication list given to you today.  *If you need a refill on your cardiac medications before your next appointment, please call your pharmacy*   Lab Work: None If you have labs (blood work) drawn today and your tests are completely normal, you will receive your results only by: Marland Kitchen MyChart Message (if you have MyChart) OR . A paper copy in the mail If you have any lab test that is abnormal or we need to change your treatment, we will call you to review the results.   Testing/Procedures: Your physician has recommended that you wear an event monitor. Event monitors are medical devices that record the heart's electrical activity. Doctors most often [place] these monitors to diagnose arrhythmias. Arrhythmias are problems with the speed or rhythm of the heartbeat. The monitor is a small, portable device. You can wear one while you do your normal daily activities. This is usually used to diagnose what is causing palpitations/syncope (passing out).    Follow-Up:  We will see you back sometime after you have your defibrillator placed.    Other Instructions

## 2019-12-28 ENCOUNTER — Telehealth: Payer: Self-pay | Admitting: Radiology

## 2019-12-28 NOTE — Telephone Encounter (Signed)
Enrolled patient for a replacement  30 day [employer] Event monitor to be mailed to patients home. Patient was instructed to mail original monitor he had not applied back.

## 2020-01-01 ENCOUNTER — Encounter: Payer: Self-pay | Admitting: *Deleted

## 2020-01-01 ENCOUNTER — Telehealth: Payer: Self-pay | Admitting: Interventional Cardiology

## 2020-01-01 ENCOUNTER — Telehealth: Payer: Self-pay

## 2020-01-01 ENCOUNTER — Other Ambulatory Visit: Payer: Self-pay | Admitting: *Deleted

## 2020-01-01 DIAGNOSIS — I5022 Chronic systolic (congestive) heart failure: Secondary | ICD-10-CM

## 2020-01-01 NOTE — Telephone Encounter (Signed)
Called and left message for pt to call back.  We need pt to put monitor on ASAP and mail back on Saturday so we can try to get results downloaded before his scheduled ICD implant on 10/25

## 2020-01-02 ENCOUNTER — Encounter (INDEPENDENT_AMBULATORY_CARE_PROVIDER_SITE_OTHER): Payer: BC Managed Care – PPO

## 2020-01-02 DIAGNOSIS — R002 Palpitations: Secondary | ICD-10-CM

## 2020-01-02 NOTE — Telephone Encounter (Signed)
Spoke with pt and he states he did receive my messages yesterday and plans to put the monitor on today.  He will drop it at a [employer] store on Saturday to be returned.

## 2020-01-04 NOTE — Telephone Encounter (Signed)
Pt is scheduled for subcut ICD on 10/25 pending HM results.  Work up complete.  Will monitor for results of HM

## 2020-01-08 ENCOUNTER — Telehealth: Payer: Self-pay | Admitting: *Deleted

## 2020-01-08 NOTE — Telephone Encounter (Signed)
Informed [employer] patient had been instructed by provider to apply monitor from August enrollment.  Patient has been instructed to wear monitor 3 days and return to [employer]. Requested [employer] initiate an early termination of the patients 30 day enrollment.  Requested [employer] expedite End of Service report, stating report was needed by Thursday 01/10/2020.

## 2020-01-11 ENCOUNTER — Other Ambulatory Visit: Payer: Self-pay

## 2020-01-11 ENCOUNTER — Other Ambulatory Visit (HOSPITAL_COMMUNITY)
Admission: RE | Admit: 2020-01-11 | Discharge: 2020-01-11 | Disposition: A | Discharge status: Home / Self Care | Payer: BC Managed Care – PPO | Source: Ambulatory Visit | Attending: Internal Medicine | Admitting: Internal Medicine

## 2020-01-11 ENCOUNTER — Other Ambulatory Visit (INDEPENDENT_AMBULATORY_CARE_PROVIDER_SITE_OTHER): Payer: BC Managed Care – PPO | Admitting: *Deleted

## 2020-01-11 DIAGNOSIS — Z20822 Contact with and (suspected) exposure to covid-19: Secondary | ICD-10-CM | POA: Diagnosis not present

## 2020-01-11 DIAGNOSIS — Z01812 Encounter for preprocedural laboratory examination: Secondary | ICD-10-CM | POA: Diagnosis not present

## 2020-01-11 DIAGNOSIS — I5022 Chronic systolic (congestive) heart failure: Secondary | ICD-10-CM

## 2020-01-11 LAB — BASIC METABOLIC PANEL
BUN/Creatinine Ratio: 14 (ref 9–20)
BUN: 14 mg/dL (ref 6–20)
CO2: 26 mmol/L (ref 20–29)
Calcium: 9.6 mg/dL (ref 8.7–10.2)
Chloride: 101 mmol/L (ref 96–106)
Creatinine, Ser: 1 mg/dL (ref 0.76–1.27)
GFR calc Af Amer: 110 mL/min/{1.73_m2} (ref >59)
GFR calc non Af Amer: 95 mL/min/{1.73_m2} (ref >59)
Glucose: 95 mg/dL (ref 65–99)
Potassium: 4.2 mmol/L (ref 3.5–5.2)
Sodium: 139 mmol/L (ref 134–144)

## 2020-01-11 LAB — CBC WITH DIFFERENTIAL/PLATELET
Basophils Absolute: 0.1 10*3/uL (ref 0.0–0.2)
Basos: 1 %
EOS (ABSOLUTE): 0.2 10*3/uL (ref 0.0–0.4)
Eos: 3 %
Hematocrit: 46.2 % (ref 37.5–51.0)
Hemoglobin: 15 g/dL (ref 13.0–17.7)
Immature Grans (Abs): 0 10*3/uL (ref 0.0–0.1)
Immature Granulocytes: 0 %
Lymphocytes Absolute: 1.5 10*3/uL (ref 0.7–3.1)
Lymphs: 23 %
MCH: 29 pg (ref 26.6–33.0)
MCHC: 32.5 g/dL (ref 31.5–35.7)
MCV: 89 fL (ref 79–97)
Monocytes Absolute: 0.5 10*3/uL (ref 0.1–0.9)
Monocytes: 7 %
Neutrophils Absolute: 4.3 10*3/uL (ref 1.4–7.0)
Neutrophils: 66 %
Platelets: 219 10*3/uL (ref 150–450)
RBC: 5.17 x10E6/uL (ref 4.14–5.80)
RDW: 13.1 % (ref 11.6–15.4)
WBC: 6.6 10*3/uL (ref 3.4–10.8)

## 2020-01-11 LAB — SARS CORONAVIRUS 2 (TAT 6-24 HRS): SARS Coronavirus 2: NEGATIVE

## 2020-01-11 NOTE — Progress Notes (Signed)
Instructed patient on the following items: Arrival time 0530 Nothing to eat or drink after midnight No meds AM of procedure Responsible person to drive you home and stay with you for 24 hrs Wash with special soap night before and morning of procedure  Last dose of Brilinta on 10/22 pm dose

## 2020-01-14 ENCOUNTER — Ambulatory Visit (HOSPITAL_COMMUNITY): Payer: BC Managed Care – PPO | Admitting: Certified Registered Nurse Anesthetist

## 2020-01-14 ENCOUNTER — Ambulatory Visit (HOSPITAL_COMMUNITY)
Admission: RE | Disposition: A | Discharge status: Home / Self Care | Payer: Self-pay | Source: Home / Self Care | Attending: Internal Medicine

## 2020-01-14 ENCOUNTER — Other Ambulatory Visit: Payer: Self-pay

## 2020-01-14 ENCOUNTER — Ambulatory Visit (HOSPITAL_COMMUNITY)
Admission: RE | Admit: 2020-01-14 | Discharge: 2020-01-14 | Disposition: A | Discharge status: Home / Self Care | Payer: BC Managed Care – PPO | Attending: Internal Medicine | Admitting: Internal Medicine

## 2020-01-14 DIAGNOSIS — Z79899 Other long term (current) drug therapy: Secondary | ICD-10-CM | POA: Insufficient documentation

## 2020-01-14 DIAGNOSIS — I5041 Acute combined systolic (congestive) and diastolic (congestive) heart failure: Secondary | ICD-10-CM | POA: Diagnosis not present

## 2020-01-14 DIAGNOSIS — I255 Ischemic cardiomyopathy: Secondary | ICD-10-CM

## 2020-01-14 DIAGNOSIS — I25110 Atherosclerotic heart disease of native coronary artery with unstable angina pectoris: Secondary | ICD-10-CM | POA: Insufficient documentation

## 2020-01-14 DIAGNOSIS — Z955 Presence of coronary angioplasty implant and graft: Secondary | ICD-10-CM | POA: Insufficient documentation

## 2020-01-14 DIAGNOSIS — I252 Old myocardial infarction: Secondary | ICD-10-CM | POA: Diagnosis not present

## 2020-01-14 DIAGNOSIS — Z7982 Long term (current) use of aspirin: Secondary | ICD-10-CM | POA: Diagnosis not present

## 2020-01-14 DIAGNOSIS — I5022 Chronic systolic (congestive) heart failure: Secondary | ICD-10-CM | POA: Diagnosis not present

## 2020-01-14 DIAGNOSIS — E785 Hyperlipidemia, unspecified: Secondary | ICD-10-CM | POA: Insufficient documentation

## 2020-01-14 HISTORY — PX: SUBQ ICD IMPLANT: EP1223

## 2020-01-14 SURGERY — SUBQ ICD IMPLANT
Anesthesia: General

## 2020-01-14 MED ORDER — PROPOFOL 10 MG/ML IV BOLUS
INTRAVENOUS | Status: DC | PRN
  Administered 2020-01-14: 80 mg via INTRAVENOUS

## 2020-01-14 MED ORDER — CEFAZOLIN SODIUM-DEXTROSE 1-4 GM/50ML-% IV SOLN
1.0000 g | Freq: Once | INTRAVENOUS | Status: AC
  Administered 2020-01-14: 1 g via INTRAVENOUS
  Filled 2020-01-14: qty 50

## 2020-01-14 MED ORDER — BUPIVACAINE HCL (PF) 0.25 % IJ SOLN
INTRAMUSCULAR | Status: AC
  Filled 2020-01-14: qty 120

## 2020-01-14 MED ORDER — SUGAMMADEX SODIUM 200 MG/2ML IV SOLN
INTRAVENOUS | Status: DC | PRN
  Administered 2020-01-14: 200 mg via INTRAVENOUS

## 2020-01-14 MED ORDER — BUPIVACAINE HCL (PF) 0.25 % IJ SOLN
INTRAMUSCULAR | Status: DC | PRN
  Administered 2020-01-14: 120 mL

## 2020-01-14 MED ORDER — ONDANSETRON HCL 4 MG/2ML IJ SOLN: 4.0000 mg | Freq: Four times a day (QID) | INTRAMUSCULAR | Status: DC | PRN

## 2020-01-14 MED ORDER — ONDANSETRON HCL 4 MG/2ML IJ SOLN
INTRAMUSCULAR | Status: DC | PRN
  Administered 2020-01-14: 4 mg via INTRAVENOUS

## 2020-01-14 MED ORDER — MIDAZOLAM HCL 5 MG/5ML IJ SOLN
INTRAMUSCULAR | Status: DC | PRN
  Administered 2020-01-14 (×2): 1 mg via INTRAVENOUS

## 2020-01-14 MED ORDER — HEPARIN (PORCINE) IN NACL 1000-0.9 UT/500ML-% IV SOLN
INTRAVENOUS | Status: AC
  Filled 2020-01-14: qty 500

## 2020-01-14 MED ORDER — SODIUM CHLORIDE 0.9 % IV SOLN
INTRAVENOUS | Status: DC | PRN
  Administered 2020-01-14: 500 mL

## 2020-01-14 MED ORDER — CEFAZOLIN SODIUM-DEXTROSE 2-4 GM/100ML-% IV SOLN
2.0000 g | INTRAVENOUS | Status: AC
  Administered 2020-01-14: 2 g via INTRAVENOUS

## 2020-01-14 MED ORDER — CEFAZOLIN SODIUM-DEXTROSE 2-4 GM/100ML-% IV SOLN
INTRAVENOUS | Status: AC
  Filled 2020-01-14: qty 100

## 2020-01-14 MED ORDER — ACETAMINOPHEN 325 MG PO TABS
325.0000 mg | ORAL_TABLET | ORAL | Status: DC | PRN
  Administered 2020-01-14 (×2): 650 mg via ORAL
  Filled 2020-01-14: qty 2

## 2020-01-14 MED ORDER — SODIUM CHLORIDE 0.9 % IV SOLN: INTRAVENOUS | Status: DC

## 2020-01-14 MED ORDER — SODIUM CHLORIDE 0.9 % IV SOLN
80.0000 mg | INTRAVENOUS | Status: AC
  Administered 2020-01-14: 80 mg

## 2020-01-14 MED ORDER — LIDOCAINE 2% (20 MG/ML) 5 ML SYRINGE
INTRAMUSCULAR | Status: DC | PRN
  Administered 2020-01-14: 50 mg via INTRAVENOUS

## 2020-01-14 MED ORDER — ROCURONIUM BROMIDE 10 MG/ML (PF) SYRINGE
PREFILLED_SYRINGE | INTRAVENOUS | Status: DC | PRN
  Administered 2020-01-14: 60 mg via INTRAVENOUS
  Administered 2020-01-14: 20 mg via INTRAVENOUS

## 2020-01-14 MED ORDER — HEPARIN (PORCINE) IN NACL 1000-0.9 UT/500ML-% IV SOLN
INTRAVENOUS | Status: DC | PRN
  Administered 2020-01-14: 500 mL

## 2020-01-14 MED ORDER — FENTANYL CITRATE (PF) 250 MCG/5ML IJ SOLN
INTRAMUSCULAR | Status: DC | PRN
Start: 2020-01-14 — End: 2020-01-14
  Administered 2020-01-14: 25 ug via INTRAVENOUS

## 2020-01-14 MED ORDER — SODIUM CHLORIDE 0.9 % IV SOLN
INTRAVENOUS | Status: AC
  Filled 2020-01-14 (×2): qty 2

## 2020-01-14 MED ORDER — DEXAMETHASONE SODIUM PHOSPHATE 10 MG/ML IJ SOLN
INTRAMUSCULAR | Status: DC | PRN
  Administered 2020-01-14: 5 mg via INTRAVENOUS

## 2020-01-14 MED ORDER — PHENYLEPHRINE HCL-NACL 10-0.9 MG/250ML-% IV SOLN
INTRAVENOUS | Status: DC | PRN
  Administered 2020-01-14: 25 ug/min via INTRAVENOUS

## 2020-01-14 MED ORDER — ACETAMINOPHEN 325 MG PO TABS
ORAL_TABLET | ORAL | Status: AC
  Filled 2020-01-14: qty 2

## 2020-01-14 SURGICAL SUPPLY — 4 items
ICD SUBCU MRI EMBLEM A219 (ICD Generator) ×2 IMPLANT
LEAD SUBQU EMBLEM 3501 (Pacemaker) ×2 IMPLANT
PAD PRO RADIOLUCENT 2001M-C (PAD) ×4 IMPLANT
TRAY PACEMAKER INSERTION (PACKS) ×2 IMPLANT

## 2020-01-14 NOTE — Progress Notes (Signed)
Patient was given discharge instructions. He verbalized understanding. 

## 2020-01-14 NOTE — Transfer of Care (Signed)
Immediate Anesthesia Transfer of Care Note  Patient: James [name]  Procedure(s) Performed: SUBQ ICD IMPLANT (N/A )  Patient Location: Cath Lab  Anesthesia Type:General  Level of Consciousness: awake, alert  and oriented  Airway & Oxygen Therapy: Patient Spontanous Breathing and Patient connected to nasal cannula oxygen  Post-op Assessment: Report given to RN, Post -op Vital signs reviewed and stable and Patient moving all extremities X 4  Post vital signs: Reviewed and stable  Last Vitals:  Vitals Value Taken Time  BP 110/65 01/14/20 0950  Temp    Pulse 81 01/14/20 0953  Resp 12 01/14/20 0953  SpO2 100 % 01/14/20 0953  Vitals shown include unvalidated device data.  Last Pain:  Vitals:   01/14/20 0621  TempSrc:   PainSc: 0-No pain      Patients Stated Pain Goal: 3 (01/14/20 8616)  Complications: No complications documented.

## 2020-01-14 NOTE — Anesthesia Preprocedure Evaluation (Addendum)
Anesthesia Evaluation  Patient identified by MRN, date of birth, ID band Patient awake    Reviewed: Allergy & Precautions, NPO status , Patient's Chart, lab work & pertinent test results  History of Anesthesia Complications Negative for: history of anesthetic complications  Airway Mallampati: II  TM Distance: >3 FB Neck ROM: Full    Dental  (+) Dental Advisory Given, Teeth Intact   Pulmonary neg pulmonary ROS,  Covid-19 Nucleic Acid Test Results Lab Results      Component                Value               Date                      SARSCOV2NAA              NEGATIVE            01/11/2020                SARSCOV2NAA              NEGATIVE            11/13/2018                SARSCOV2NAA              NEGATIVE            10/12/2018              breath sounds clear to auscultation       Cardiovascular (-) angina+ CAD, + Past MI, + Cardiac Stents and +CHF   Rhythm:Regular  Left ventricular ejection fraction, by estimation, is 30 to 35%. The  left ventricle has moderately decreased function. The left ventricle  demonstrates regional wall motion abnormalities with mid to apical  anteroseptal akinesis, mid to apical anterior  akinesis, apical inferior akinesis, apical akinesis. No LV thrombus. Left  ventricular diastolic parameters were normal.  2. Right ventricular systolic function is normal. The right ventricular  size is normal. Tricuspid regurgitation signal is inadequate for assessing  PA pressure.  3. The mitral valve is normal in structure. No evidence of mitral valve  regurgitation. No evidence of mitral stenosis.  4. The aortic valve is tricuspid. Aortic valve regurgitation is not  visualized. No aortic stenosis is present.  5. The inferior vena cava is normal in size with greater than 50%  respiratory variability, suggesting right atrial pressure of 3 mmHg.    1. Widely patent stent extending from LMCA into the  proximal LAD. There is no significant stenosis at the ostium of the jailed, dominant LCx. 2. Moderately to severely reduced left ventricular systolic function with anterior akinesis. 3. Normal to low left and right heart filling pressures. 4. Normal Fick cardiac output/index. 5. No significant intracardiac shunting (Qp:Qs) < 1.2.  Recommendations: 1. Continue aggressive secondary prevention and dual antiplatelet therapy. 2. I will discontinue ibuprofen to minimize risk for bleeding and nephrotoxicity.    Neuro/Psych negative neurological ROS  negative psych ROS   GI/Hepatic Neg liver ROS, GERD  Controlled and Medicated,  Endo/Other  negative endocrine ROS  Renal/GU negative Renal ROSLab Results      Component                Value               Date  CREATININE               1.00                01/11/2020                Musculoskeletal negative musculoskeletal ROS (+)   Abdominal   Peds  Hematology negative hematology ROS (+) Lab Results      Component                Value               Date                      WBC                      6.6                 01/11/2020                HGB                      15.0                01/11/2020                HCT                      46.2                01/11/2020                MCV                      89                  01/11/2020                PLT                      219                 01/11/2020              Anesthesia Other Findings   Reproductive/Obstetrics                            Anesthesia Physical Anesthesia Plan  ASA: III  Anesthesia Plan: General   Post-op Pain Management:    Induction: Intravenous  PONV Risk Score and Plan: 2 and Ondansetron and Dexamethasone  Airway Management Planned: Oral ETT  Additional Equipment: None  Intra-op Plan:   Post-operative Plan: Extubation in OR  Informed Consent: I have reviewed the patients History and Physical,  chart, labs and discussed the procedure including the risks, benefits and alternatives for the proposed anesthesia with the patient or authorized representative who has indicated his/her understanding and acceptance.     Dental advisory given  Plan Discussed with: CRNA and Surgeon  Anesthesia Plan Comments:         Anesthesia Quick Evaluation

## 2020-01-14 NOTE — Anesthesia Procedure Notes (Signed)
Procedure Name: Intubation Date/Time: 01/14/2020 7:58 AM Performed by: Kyung Rudd, CRNA Pre-anesthesia Checklist: Patient identified, Emergency Drugs available, Suction available and Patient being monitored Patient Re-evaluated:Patient Re-evaluated prior to induction Oxygen Delivery Method: Circle system utilized Preoxygenation: Pre-oxygenation with 100% oxygen Induction Type: IV induction Ventilation: Mask ventilation without difficulty Laryngoscope Size: Mac and 3 Grade View: Grade II Tube type: Oral Tube size: 7.0 mm Number of attempts: 1 Airway Equipment and Method: Stylet Placement Confirmation: ETT inserted through vocal cords under direct vision,  positive ETCO2 and breath sounds checked- equal and bilateral Secured at: 21 cm Tube secured with: Tape Dental Injury: Teeth and Oropharynx as per pre-operative assessment

## 2020-01-14 NOTE — Anesthesia Postprocedure Evaluation (Signed)
Anesthesia Post Note  Patient: James [name]  Procedure(s) Performed: SUBQ ICD IMPLANT (N/A )     Patient location during evaluation: PACU Anesthesia Type: General Level of consciousness: awake and alert Pain management: pain level controlled Vital Signs Assessment: post-procedure vital signs reviewed and stable Respiratory status: spontaneous breathing, nonlabored ventilation, respiratory function stable and patient connected to nasal cannula oxygen Cardiovascular status: blood pressure returned to baseline and stable Postop Assessment: no apparent nausea or vomiting Anesthetic complications: no   No complications documented.  Last Vitals:  Vitals:   01/14/20 1015 01/14/20 1030  BP: 106/64 106/60  Pulse: 82 82  Resp: 16 16  Temp: 36.5 C   SpO2: 100% 100%    Last Pain:  Vitals:   01/14/20 1015  TempSrc: Temporal  PainSc: 6                  Shelton Silvas

## 2020-01-14 NOTE — Discharge Instructions (Signed)
° ° °  Supplemental Discharge Instructions for  Pacemaker/Defibrillator Patients  Activity No heavy lifting or vigorous activity with your left arm for 6 to 8 weeks.  Do not raise your left arm above your head for one week.  Gradually raise your affected arm as drawn below.          NO DRIVING for  1 week   ; you may begin driving on  01/24/7261.  WOUND CARE - Keep the wound area clean and dry.  Do not get this area wet , no showers for one week; you may shower on 01/21/2020. - Tomorrow, 01/15/2020 remove the outer plastic bandage.  Underneath the plastic bandage there are steris strips (paper tapes), DO NOT remove these. - The tape/steri-strips on your wound will fall off; do not pull them off.  No bandage is needed on the site.  DO  NOT apply any creams, oils, or ointments to the wound area. - If you notice any drainage or discharge from the wound, any swelling or bruising at the site, or you develop a fever > 101? F after you are discharged home, call the office at once.  Special Instructions - You are still able to use cellular telephones; use the ear opposite the side where you have your pacemaker/defibrillator.  Avoid carrying your cellular phone near your device. - When traveling through airports, show security personnel your identification card to avoid being screened in the metal detectors.  Ask the security personnel to use the hand wand. - Avoid arc welding equipment, MRI testing (magnetic resonance imaging), TENS units (transcutaneous nerve stimulators).  Call the office for questions about other devices. - Avoid electrical appliances that are in poor condition or are not properly grounded. - Microwave ovens are safe to be near or to operate.  Additional information for defibrillator patients should your device go off: - If your device goes off ONCE and you feel fine afterward, notify the device clinic nurses. - If your device goes off ONCE and you do not feel well afterward, call  911. - If your device goes off TWICE, call 911. - If your device goes off THREE times in one day, call 911.  DO NOT DRIVE YOURSELF OR A FAMILY MEMBER WITH A DEFIBRILLATOR TO THE HOSPITAL--CALL 911.

## 2020-01-14 NOTE — H&P (Signed)
HPI [name] is referred today by Dr. Johney [name] to consider S-ICD insertion. He is a pleasant 39 yo man with an ICM, s/p late presenting occluded LAD, s/p PCI. He has an EF of 30% despite guideline directed medical therapy. He has class 2 symptoms. He has not had syncope.       Allergies  Allergen Reactions  . Vascepa [Icosapent Ethyl]     Heart racing           Current Outpatient Medications  Medication Sig Dispense Refill  . aspirin 81 MG chewable tablet Chew 1 tablet (81 mg total) by mouth daily. 90 tablet 1  . Evolocumab (REPATHA SURECLICK) 140 MG/ML SOAJ Inject 1 pen into the skin every 14 (fourteen) days. 2 pen 11  . metoprolol succinate (TOPROL-XL) 25 MG 24 hr tablet TAKE 1/2 TABLET BY MOUTH EVERY DAY 45 tablet 3  . nitroGLYCERIN (NITROSTAT) 0.4 MG SL tablet Place 1 tablet (0.4 mg total) under the tongue every 5 (five) minutes x 3 doses as needed for chest pain. 25 tablet 2  . pantoprazole (PROTONIX) 40 MG tablet Take 1 tablet (40 mg total) by mouth daily. 90 tablet 3  . sacubitril-valsartan (ENTRESTO) 49-51 MG Take 1 tablet by mouth 2 (two) times daily.     . ticagrelor (BRILINTA) 90 MG TABS tablet Take 1 tablet (90 mg total) by mouth 2 (two) times daily. 180 tablet 3   No current facility-administered medications for this visit.         Past Medical History:  Diagnosis Date  . Acute combined systolic and diastolic heart failure (HCC) 10-17-2018  . Acute ST elevation myocardial infarction (STEMI) (HCC) 10/12/2018  . At risk for sudden cardiac death October 17, 2018  . Cardiomyopathy, ischemic 11/14/2018  . CHF (congestive heart failure) (HCC)   . 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 Coronary artery disease involving native coronary artery of native heart with unstable angina pectoris (HCC) October 17, 2018  . Delayed presentation of acute anterolateral STEMI 10/12/2018  . Hyperlipidemia    possible elevated triglycerides  . Ischemic  cardiomyopathy    EF 30-35% // Echo 11/2018: Anteroseptal and anterior hypokinesis, EF 30-35, small circumferential pericardial effusion, no intracardiac thrombi   . Presence of drug coated stent in LAD coronary artery Oct 17, 2018   Ostial LAD DES PCI: Resolute Onyx 3.5 mm x 18 mm - 3.6 mm  . STEMI (ST elevation myocardial infarction) (HCC) 10/12/2018    ROS:   All systems reviewed and negative except as noted in the HPI.        Past Surgical History:  Procedure Laterality Date  . CARDIAC CATHETERIZATION    . CORONARY/GRAFT ACUTE MI REVASCULARIZATION N/A 10/12/2018   Procedure: CORONARY/GRAFT ACUTE MI REVASCULARIZATION;  Surgeon: Lyn Records, MD;  Location: [facility] INVASIVE CV LAB;  Service: Cardiovascular;  Laterality: N/A;  . LEFT HEART CATH AND CORONARY ANGIOGRAPHY N/A 10/12/2018   Procedure: LEFT HEART CATH AND CORONARY ANGIOGRAPHY;  Surgeon: Lyn Records, MD;  Location: [facility] INVASIVE CV LAB;  Service: Cardiovascular;  Laterality: N/A;  . RIGHT/LEFT HEART CATH AND CORONARY ANGIOGRAPHY N/A 11/13/2018   Procedure: RIGHT/LEFT HEART CATH AND CORONARY ANGIOGRAPHY;  Surgeon: Yvonne Kendall, MD;  Location: [facility] INVASIVE CV LAB;  Service: Cardiovascular;  Laterality: N/A;          Family History  Problem Relation Age of Onset  . Thyroid disease Mother   . Thyroid disease Brother   . Diabetes Brother   .  Heart disease Neg Hx      Social History        Socioeconomic History  . Marital status: Married    Spouse name: Not on file  . Number of children: Not on file  . Years of education: 13  . Highest education level: Not on file  Occupational History  . Occupation: Primary school teacher: [employer]   Tobacco Use  . Smoking status: Never Smoker  . Smokeless tobacco: Never Used  Substance and Sexual Activity  . Alcohol use: Yes    Alcohol/week: 2.0 standard drinks    Types: 2 Cans of beer per week    Comment: 2 packs/month   . Drug use: Never  . Sexual  activity: Not on file  Other Topics Concern  . Not on file  Social History Narrative   Patient lives with wife in [place] and small child   Works in Consulting civil engineer for [employer]   Social Determinants of Health      Financial Resource Strain: Low Risk   . Difficulty of Paying Living Expenses: Not hard at all  Food Insecurity: No Food Insecurity  . Worried About Programme researcher, broadcasting/film/video in the Last Year: Never true  . Ran Out of Food in the Last Year: Never true  Transportation Needs: No Transportation Needs  . Lack of Transportation (Medical): No  . Lack of Transportation (Non-Medical): No  Physical Activity: Sufficiently Active  . Days of Exercise per Week: 4 days  . Minutes of Exercise per Session: 40 min  Stress: No Stress Concern Present  . Feeling of Stress : Only a little  Social Connections:   . Frequency of Communication with Friends and Family: Not on file  . Frequency of Social Gatherings with Friends and Family: Not on file  . Attends Religious Services: Not on file  . Active Member of Clubs or Organizations: Not on file  . Attends Banker Meetings: Not on file  . Marital Status: Not on file  Intimate Partner Violence:   . Fear of Current or Ex-Partner: Not on file  . Emotionally Abused: Not on file  . Physically Abused: Not on file  . Sexually Abused: Not on file     BP 106/66   Pulse 80   Ht 5\' 5"  (1.651 m)   Wt 119 lb 9.6 oz (54.3 kg)   SpO2 98%   BMI 19.90 kg/m   Physical Exam:  Overweight but well appearing NAD HEENT: Unremarkable Neck:  6 cm JVD, no thyromegally Lymphatics:  No adenopathy Back:  No CVA tenderness Lungs:  Clear with no wheezes HEART:  Regular rate rhythm, no murmurs, no rubs, no clicks Abd:  soft, positive bowel sounds, no organomegally, no rebound, no guarding Ext:  2 plus pulses, no edema, no cyanosis, no clubbing Skin:  No rashes no nodules Neuro:  CN II through XII intact, motor grossly intact  DEVICE  Normal  device function.  See PaceArt for details.   Assess/Plan: 1. Chronic systolic heart failure - his symptoms are class 2. He will continue his current meds. 2. ICM - he denies anginal symptoms.  I have discussed the treatment options with the patient. I have recommended he undergo S-ICD insertion for primary prevention. He will call when he would like to proceed. He will need general anesthesia.  [place]  EP Attending  Patient seen and examined. All questions answered. He presents for ICD insertion. I have reviewed the indictions/risks/benefits/goals/expectations and he wishes  to proceed.  Sharlot Gowda Jacquelynn Friend,MD

## 2020-01-14 NOTE — H&P (Signed)
ICD Criteria  Current LVEF:30%. Within 12 months prior to implant: Yes   Heart failure history: Yes, Class I  Cardiomyopathy history: Yes, Ischemic Cardiomyopathy - Prior MI.  Atrial Fibrillation/Atrial Flutter: No.  Ventricular tachycardia history: No.  Cardiac arrest history: No.  History of syndromes with risk of sudden death: No.  Previous ICD: No.  Current ICD indication: Primary  PPM indication: No.  Class I or II Bradycardia indication present: No  Beta Blocker therapy for 3 or more months: Yes, prescribed.   Ace Inhibitor/ARB therapy for 3 or more months: Yes, prescribed.    I have seen James [name] is a 39 y.o. malepre-procedural and has been referred by Dr. Johney Frame for consideration of ICD implant for primary prevention of sudden death.  The patient's chart has been reviewed and they meet criteria for ICD implant.  I have had a thorough discussion with the patient reviewing options.  The patient and their family (if available) have had opportunities to ask questions and have them answered. The patient and I have decided together through the [facility] Heart Care Share Decision Support Tool to proceed with ICD at this time.  Risks, benefits, alternatives to ICD implantation were discussed in detail with the patient today. The patient  understands that the risks include but are not limited to bleeding, infection, pneumothorax, perforation, tamponade, vascular damage, renal failure, MI, stroke, death, inappropriate shocks, and lead dislodgement and he wishes to proceed.

## 2020-01-15 ENCOUNTER — Encounter (HOSPITAL_COMMUNITY): Payer: Self-pay | Admitting: Internal Medicine

## 2020-01-15 MED FILL — Gentamicin Sulfate Inj 40 MG/ML: INTRAMUSCULAR | Qty: 80 | Status: AC

## 2020-01-15 MED FILL — Cefazolin Sodium-Dextrose IV Solution 2 GM/100ML-4%: INTRAVENOUS | Qty: 100 | Status: AC

## 2020-01-24 ENCOUNTER — Ambulatory Visit: Payer: BC Managed Care – PPO

## 2020-01-25 ENCOUNTER — Ambulatory Visit (INDEPENDENT_AMBULATORY_CARE_PROVIDER_SITE_OTHER): Payer: BC Managed Care – PPO | Admitting: Physician Assistant

## 2020-01-25 ENCOUNTER — Other Ambulatory Visit: Payer: Self-pay

## 2020-01-25 ENCOUNTER — Encounter: Payer: Self-pay | Admitting: Physician Assistant

## 2020-01-25 VITALS — BP 108/68 | HR 80 | Ht 65.0 in | Wt 122.8 lb

## 2020-01-25 DIAGNOSIS — Z95810 Presence of automatic (implantable) cardiac defibrillator: Secondary | ICD-10-CM

## 2020-01-25 DIAGNOSIS — I5022 Chronic systolic (congestive) heart failure: Secondary | ICD-10-CM

## 2020-01-25 DIAGNOSIS — I2510 Atherosclerotic heart disease of native coronary artery without angina pectoris: Secondary | ICD-10-CM

## 2020-01-25 DIAGNOSIS — I255 Ischemic cardiomyopathy: Secondary | ICD-10-CM | POA: Diagnosis not present

## 2020-01-25 NOTE — Progress Notes (Signed)
Cardiology Office Note Date:  01/25/2020  Patient ID:  James [name], DOB 1981/02/06, MRN [id number] PCP:  Patient, No Pcp Per  Cardiologist:  Dr. Tamala Julian Electrophysiologist: Dr. Lovena Le  Chief Complaint:  wound check visit  History of Present Illness: James [name] is a 39 y.o. 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  He comes in today to be seen for Dr. Lovena Le. He underwent S-ICD implant 01/14/20 and presents today for post implant visit.  He is doing well, still a little sore though improving, No CP, palpitations or cardiac awareness No SOB No near syncope or syncope. After his procedure felt like his L upper arm was sore, massaging it seems to increase the aching/discomfort and when he stops it feels better Not exertional or positional, and also better then the 1st couple days  Yesterday when he was getting out of bed, noticed some blood at the superior chest wound site, nothing that persisted    Device information BSCi  S-ICD implanted 01/14/2020   Past Medical History:  Diagnosis Date  . Acute combined systolic and diastolic heart failure ([place]) 10/14/2018  . Acute ST elevation myocardial infarction (STEMI) ([place]) 10/12/2018  . At risk for sudden cardiac death 10/14/2018  . Cardiomyopathy, ischemic 11/14/2018  . CHF (congestive heart failure) ([place])   . 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 Coronary artery disease involving native coronary artery of native heart with unstable angina pectoris ([place]) 10/14/2018  . Delayed presentation of acute anterolateral STEMI 10/12/2018  . Hyperlipidemia    possible elevated triglycerides  . Ischemic cardiomyopathy    EF 30-35% // Echo 11/2018: Anteroseptal and anterior hypokinesis, EF 30-35, small circumferential pericardial effusion, no intracardiac thrombi   . Presence of drug coated  stent in LAD coronary artery Oct 14, 2018   Ostial LAD DES PCI: Resolute Onyx 3.5 mm x 18 mm - 3.6 mm  . STEMI (ST elevation myocardial infarction) ([place]) 10/12/2018    Past Surgical History:  Procedure Laterality Date  . CARDIAC CATHETERIZATION    . CORONARY/GRAFT ACUTE MI REVASCULARIZATION N/A 10/12/2018   Procedure: CORONARY/GRAFT ACUTE MI REVASCULARIZATION;  Surgeon: [name], MD;  Location: [facility] CV LAB;  Service: Cardiovascular;  Laterality: N/A;  . LEFT HEART CATH AND CORONARY ANGIOGRAPHY N/A 10/12/2018   Procedure: LEFT HEART CATH AND CORONARY ANGIOGRAPHY;  Surgeon: [name], MD;  Location: [place] CV LAB;  Service: Cardiovascular;  Laterality: N/A;  . RIGHT/LEFT HEART CATH AND CORONARY ANGIOGRAPHY N/A 11/13/2018   Procedure: RIGHT/LEFT HEART CATH AND CORONARY ANGIOGRAPHY;  Surgeon: [name], MD;  Location: [facility] CV LAB;  Service: Cardiovascular;  Laterality: N/A;  . SUBQ ICD IMPLANT N/A 01/14/2020   Procedure: SUBQ ICD IMPLANT;  Surgeon: [name], MD;  Location: [place] [facility] CV LAB;  Service: Cardiovascular;  Laterality: N/A;    Current Outpatient Medications  Medication Sig Dispense Refill  . aspirin 81 MG chewable tablet Chew 1 tablet (81 mg total) by mouth daily. 90 tablet 1  . carboxymethylcellul-glycerin (LUBRICANT DROPS/DUAL-ACTION) 0.5-0.9 % ophthalmic solution Place 1 drop into both eyes 3 (three) times daily as needed (irritation).    . Evolocumab (REPATHA SURECLICK) 025 MG/ML SOAJ Inject 1 pen into the skin every 14 (fourteen) days. 2 pen 11  . metoprolol succinate (TOPROL-XL) 25 MG 24 hr tablet TAKE 1/2 TABLET BY MOUTH EVERY DAY 45  tablet 3  . nitroGLYCERIN (NITROSTAT) 0.4 MG SL tablet Place 1 tablet (0.4 mg total) under the tongue every 5 (five) minutes x 3 doses as needed for chest pain. 25 tablet 2  . pantoprazole (PROTONIX) 40 MG tablet Take 1 tablet (40 mg total) by mouth daily. 90 tablet 3  . sacubitril-valsartan (ENTRESTO) 49-51  MG Take 1 tablet by mouth 2 (two) times daily.     . ticagrelor (BRILINTA) 90 MG TABS tablet Take 1 tablet (90 mg total) by mouth 2 (two) times daily. 180 tablet 3   No current facility-administered medications for this visit.    Allergies:   Vascepa [icosapent ethyl]   Social History:  The patient  reports that he has never smoked. He has never used smokeless tobacco. He reports current alcohol use of about 2.0 standard drinks of alcohol per week. He reports that he does not use drugs.   Family History:  The patient's family history includes Diabetes in his brother; Thyroid disease in his brother and mother.  ROS:  Please see the history of present illness.    All other systems are reviewed and otherwise negative.   PHYSICAL EXAM:  VS:  BP 108/68   Pulse 80   Ht 5' 5"  (1.651 m)   Wt 122 lb 12.8 oz (55.7 kg)   SpO2 99%   BMI 20.43 kg/m  BMI: Body mass index is 20.43 kg/m. Well nourished, well developed, in no acute distress HEENT: normocephalic, atraumatic Neck: no JVD, carotid bruits or masses Cardiac:  RRR; no significant murmurs, no rubs, or gallops Lungs:  CTA b/l, no wheezing, rhonchi or rales Abd: soft, nontender MS: no deformity or atrophy Ext: no edema Skin: warm and dry, no rash Neuro:  No gross deficits appreciated Psych: euthymic mood, full affect  ICD site  Steri strips are removed from all sites easily L lateral chest/thorax wound and inferior chest wound are well healed, no erythema, edema, or heat to the surrounding tissues. Superior chest wound site notes very slight separation in skin edges at the very superior and inferior ends, no active bleeding.   EKG:  Not done today   Device interrogation done today and reviewed by myself:  Battery is 100%, electrode impedance is OK No VT  01/09/2020: Monitor  Normal sinus rhythm  No arrhythmia  No reported symptoms   11/13/2019: TTE IMPRESSIONS  1. Left ventricular ejection fraction, by estimation,  is 30 to 35%. The  left ventricle has moderately decreased function. The left ventricle  demonstrates regional wall motion abnormalities with mid to apical  anteroseptal akinesis, mid to apical anterior  akinesis, apical inferior akinesis, apical akinesis. No LV thrombus. Left  ventricular diastolic parameters were normal.  2. Right ventricular systolic function is normal. The right ventricular  size is normal. Tricuspid regurgitation signal is inadequate for assessing  PA pressure.  3. The mitral valve is normal in structure. No evidence of mitral valve  regurgitation. No evidence of mitral stenosis.  4. The aortic valve is tricuspid. Aortic valve regurgitation is not  visualized. No aortic stenosis is present.  5. The inferior vena cava is normal in size with greater than 50%  respiratory variability, suggesting right atrial pressure of 3 mmHg.    01/30/2019: c.MRI IMPRESSION: 1. Normal LV size with EF 34%, wall motion abnormalities in LAD infarction pattern as noted above.  2.  Normal RV size and systolic function, EF 32%.  3. Dense LAD territory scar, this myocardium is unlikely to be  Viable.   11/13/2018: LHC Conclusions: 1. Widely patent stent extending from LMCA into the proximal LAD. There is no significant stenosis at the ostium of the jailed, dominant LCx. 2. Moderately to severely reduced left ventricular systolic function with anterior akinesis. 3. Normal to low left and right heart filling pressures. 4. Normal Fick cardiac output/index. 5. No significant intracardiac shunting (Qp:Qs) < 1.2.  Recommendations: 1. Continue aggressive secondary prevention and dual antiplatelet therapy. 2. I will discontinue ibuprofen to minimize risk for bleeding and nephrotoxicity.   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  Late presenting acute anterior myocardial  infarction with total occlusion of the ostial LAD  Successful stenting using 3.5 x 18 Onyx deployed at 14 atm x 2.  During initial deployment the patient took a deep breath while the balloon was being inflated which retracted the balloon into the left main.  TIMI grade III flow was noted.  Dominant widely patent circumflex.  Dominant right coronary  Severe left ventricular dysfunction with anteroapical severe hypokinesis/akinesis and EF of 30 to 35%.  These findings are consistent with acute systolic heart failure, ischemically mediated  RECOMMENDATIONS:   Guideline directed therapy for acute LV systolic dysfunction/heart failure.  Aggressive secondary risk prevention  Long-term dual antiplatelet therapy due to stent position in the left main  Further coronary artery to pulmonary artery fistula.    Recent Labs: 07/16/2019: ALT 16 01/11/2020: BUN 14; Creatinine, Ser 1.00; Hemoglobin 15.0; Platelets 219; Potassium 4.2; Sodium 139  08/21/2019: Chol/HDL Ratio 2.1; Cholesterol, Total 90; HDL 43; LDL Chol Calc (NIH) 31; LDL Direct 35; Triglycerides 76   Estimated Creatinine Clearance: 78.9 mL/min (by C-G formula based on SCr of 1 mg/dL).   Wt Readings from Last 3 Encounters:  01/25/20 122 lb 12.8 oz (55.7 kg)  01/14/20 119 lb 0.8 oz (54 kg)  12/26/19 121 lb 6.4 oz (55.1 kg)     Other studies reviewed: Additional studies/records reviewed today include: summarized above  ASSESSMENT AND PLAN:  1. S-ICD     Superior chest site as above steri strips were applied to bring skin edges together.     He will take the steri strips off Thursday and if any concerns with site let us know.     No signs of infection to any of the sites      2. ICM 3. Chronic CHF (systolic)     No symptoms or exam findings to suggest volume OL     On BB, entresto  4. CAD     No anginal symptoms     L upper arm sounds musculoskeletal, is changes with massage     On ASA, Brilinta, BB,  repatha  Disposition: F/u with Dr. Lovena Le as scheduled, if he has any wound concerns, will reach out.  Current medicines are reviewed at length with the patient today.  The patient did not have any concerns regarding medicines.  Venetia Night, PA-C 01/25/2020 4:46 PM     [place] [address] [phone] (office)  [phone] (fax)

## 2020-01-25 NOTE — Patient Instructions (Addendum)
Medication Instructions:   Your physician recommends that you continue on your current medications as directed. Please refer to the Current Medication list given to you today.   *If you need a refill on your cardiac medications before your next appointment, please call your pharmacy*   Lab Work: NONE ORDERED  TODAY   If you have labs (blood work) drawn today and your tests are completely normal, you will receive your results only by: Marland Kitchen MyChart Message (if you have MyChart) OR . A paper copy in the mail If you have any lab test that is abnormal or we need to change your treatment, we will call you to review the results.   Testing/Procedures: NONE ORDERED  TODAY    Follow-Up: At [facility], you and your health needs are our priority.  As part of our continuing mission to provide you with exceptional heart care, we have created designated Provider Care Teams.  These Care Teams include your primary Cardiologist (physician) and Advanced Practice Providers (APPs -  Physician Assistants and Nurse Practitioners) who all work together to provide you with the care you need, when you need it.  We recommend signing up for the patient portal called "MyChart".  Sign up information is provided on this After Visit Summary.  MyChart is used to connect with patients for Virtual Visits (Telemedicine).  Patients are able to view lab/test results, encounter notes, upcoming appointments, etc.  Non-urgent messages can be sent to your provider as well.   To learn more about what you can do with MyChart, go to [email or web address].    Your next appointment:  KEEP APPOINTMENT AS SCHEDULED  Other Instructions  REMOVE STERILE STRIPS Thursday  AND CALL CLINIC IF ANY CONCERNS

## 2020-01-27 ENCOUNTER — Other Ambulatory Visit: Payer: Self-pay | Admitting: Interventional Cardiology

## 2020-01-29 MED ORDER — ENTRESTO 49-51 MG PO TABS: 1.0000 | ORAL_TABLET | Freq: Two times a day (BID) | ORAL | 3 refills | Status: DC

## 2020-02-01 IMAGING — MR MR CARD MORPHOLOGY WO/W CM
23 of 25 series · 38 of 40 positions shown · IV contrast (gadavist)
Comparison: none

CLINICAL DATA: Ischemic cardiomyopathy

EXAM:
CARDIAC MRI
TECHNIQUE: The patient was scanned on a 1.5 Tesla GE magnet. A dedicated
cardiac coil was used. Functional imaging was done using Fiesta
sequences. [DATE], and 4 chamber views were done to assess for RWMA's.
Modified Bambucafe rule using a short axis stack was used to
calculate an ejection fraction on a dedicated work station using
Circle software. The patient received 8 cc of Gadavist. After 10
minutes inversion recovery sequences were used to assess for
infiltration and scar tissue.

[Series 6: bSSFP · oblique · 8.0mm · 1.43mm/px · 2 of 25 slices shown (1 of 17)]
[im 1/25]
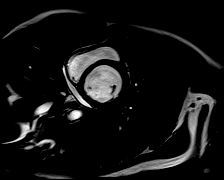
[im 25/25]
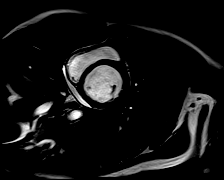

[Series 6: bSSFP · oblique · 8.0mm · 1.43mm/px · 2 of 25 slices shown (2 of 17)]
[im 1/25]
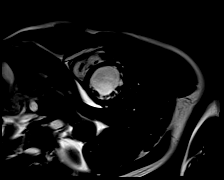
[im 25/25]
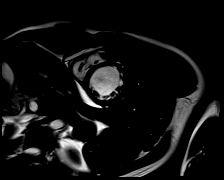

[Series 6: bSSFP · oblique · 8.0mm · 1.43mm/px · 2 of 25 slices shown (3 of 17)]
[im 1/25]
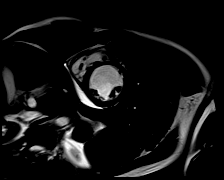
[im 25/25]
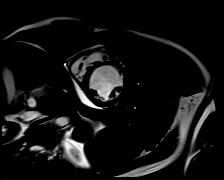

[Series 6: bSSFP · oblique · 8.0mm · 1.43mm/px · 2 of 25 slices shown (4 of 17)]
[im 1/25]
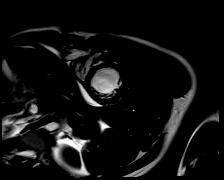
[im 25/25]
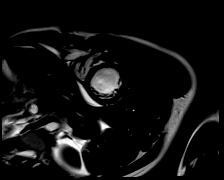

[Series 6: bSSFP · oblique · 8.0mm · 1.43mm/px · 2 of 25 slices shown (5 of 17)]
[im 1/25]
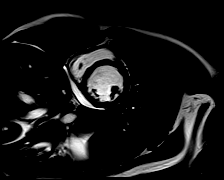
[im 25/25]
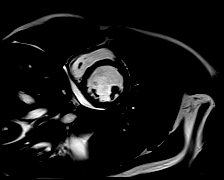

[Series 6: bSSFP · oblique · 8.0mm · 1.43mm/px · 2 of 25 slices shown (6 of 17)]
[im 1/25]
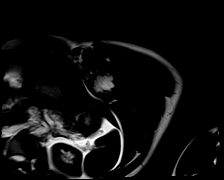
[im 25/25]
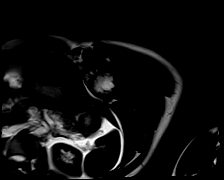

[Series 6: bSSFP · oblique · 8.0mm · 1.43mm/px · 2 of 25 slices shown (7 of 17)]
[im 1/25]
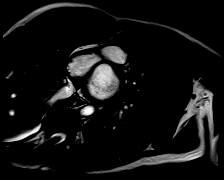
[im 25/25]
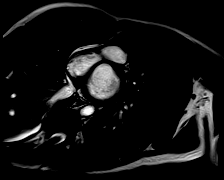

[Series 6: bSSFP · oblique · 8.0mm · 1.43mm/px · 1 of 25 slices shown (8 of 17)]
[im 1/25]
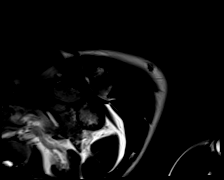

[Series 6: bSSFP · oblique · 8.0mm · 1.43mm/px · 1 of 25 slices shown (9 of 17)]
[im 1/25]
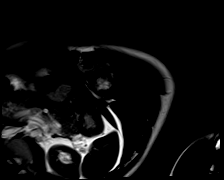

[Series 6: bSSFP · oblique · 8.0mm · 1.43mm/px · 2 of 25 slices shown (10 of 17)]
[im 1/25]
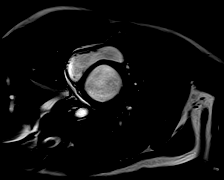
[im 25/25]
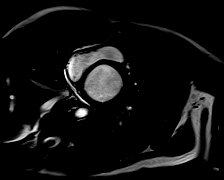

[Series 6: bSSFP · oblique · 8.0mm · 1.43mm/px · 2 of 25 slices shown (11 of 17)]
[im 1/25]
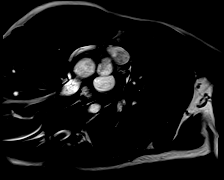
[im 25/25]
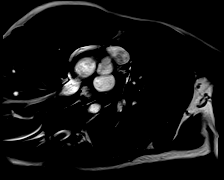

[Series 6: bSSFP · oblique · 8.0mm · 1.43mm/px · 2 of 25 slices shown (12 of 17)]
[im 1/25]
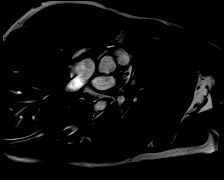
[im 25/25]
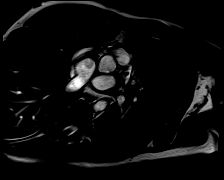

[Series 6: bSSFP · oblique · 8.0mm · 1.43mm/px · 2 of 25 slices shown (13 of 17)]
[im 1/25]
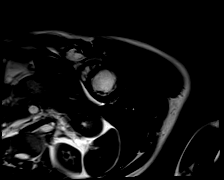
[im 25/25]
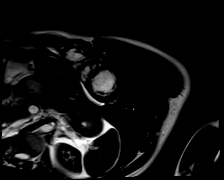

[Series 7: t2_stir_db_sax · oblique · 8.0mm · 1.54mm/px · 1 of 13 slices shown]
[im 1/13]
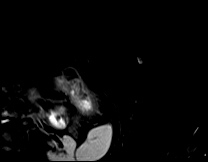

[Series 8: t2_stir_db_radial ((date)ch) · oblique · 6.0mm · 1.54mm/px · 1 of 3 slices shown]
[im 1/3]
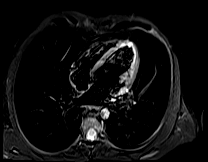

[Series 9: bSSFP · oblique · 7.0mm · 1.25mm/px · 2 of 25 slices shown (14 of 17)]
[im 1/25]
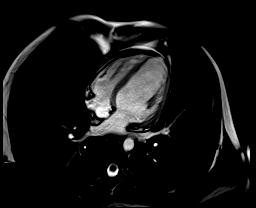
[im 25/25]
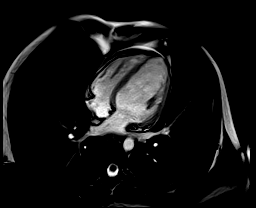

[Series 10: bSSFP · oblique · 7.0mm · 1.25mm/px · 2 of 25 slices shown (15 of 17)]
[im 1/25]
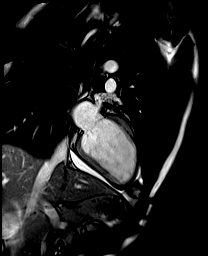
[im 25/25]
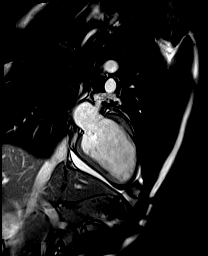

[Series 11: bSSFP · oblique · 7.0mm · 1.25mm/px · 2 of 25 slices shown (16 of 17)]
[im 1/25]
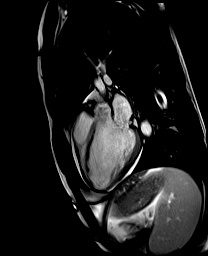
[im 25/25]
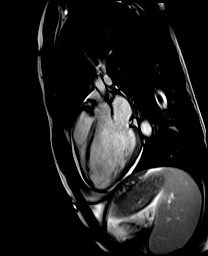

[Series 12: bSSFP · oblique · 7.0mm · 1.25mm/px · 2 of 25 slices shown (17 of 17)]
[im 1/25]
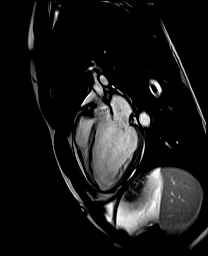
[im 25/25]
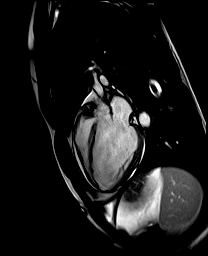

[Series 14: lge_single shot sa · oblique · 8.0mm · 1.67mm/px · 1 of 13 slices shown (1 of 2)]
[im 1/13]
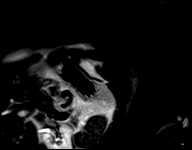

[Series 15: lge_single shot sa · oblique · 8.0mm · 1.67mm/px · 1 of 13 slices shown (2 of 2)]
[im 1/13]
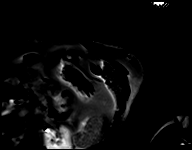

[Series 25: lge short axis_mag · oblique · 8.0mm · 1.34mm/px · 1 of 12 slices shown]
[im 1/12]
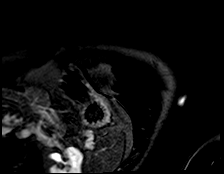

[Series 26: lge short axis_psir · oblique · 8.0mm · 1.34mm/px · 1 of 12 slices shown]
[im 1/12]
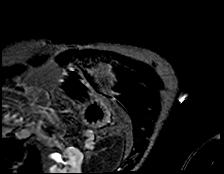

[38 of 40 positions shown; findings below may reference images not displayed]

FINDINGS: Limited images of the lung fields showed no gross abnormalities.

Small pericardial effusion primarily inferiorly. Normal left
ventricular size and wall thickness. Akinetic anteroseptal wall,
mid-apical anterior wall, apical lateral wall, apical inferior wall,
and true apex. EF 34%. Normal right ventricular size and systolic
function, EF 60%. Normal left atrial size. Normal right atrial size.
Trileaflet aortic valve with no significant stenosis or
regurgitation.

Delayed enhancement imaging:

76-99% wall thickness subendocardial late gadolinium enhancement
(LGE) throughout the entire anteroseptal wall, the mid to apical
anterior wall, and the apical inferior wall.

Full thickness scare at the true apex.

51-75% wall thickness subendocardial LGE mid to apical anterolateral
wall.

Measurements:

LVEDV 126 mL

LVSV 43 mL

LVEF 34%

RVEDV 72 mL

RVSV 46 mL

RVEF 64%
IMPRESSION: 1. Normal LV size with EF 34%, wall motion abnormalities in LAD
infarction pattern as noted above.

2.  Normal RV size and systolic function, EF 60%.

3. Dense LAD territory scar, this myocardium is unlikely to be
viable.

Maxwell Nyandoro Sumesh As

## 2020-02-18 ENCOUNTER — Other Ambulatory Visit: Payer: Self-pay | Admitting: Cardiology

## 2020-03-28 ENCOUNTER — Ambulatory Visit: Payer: BC Managed Care – PPO | Attending: Internal Medicine

## 2020-03-28 DIAGNOSIS — Z23 Encounter for immunization: Secondary | ICD-10-CM

## 2020-03-28 NOTE — Progress Notes (Signed)
   Covid-19 Vaccination Clinic  Name:  James [name]    MRN: [id number] DOB: 26-Oct-1980  03/28/2020  [name] was observed post Covid-19 immunization for 15 minutes without incident. He was provided with Vaccine Information Sheet and instruction to access the V-Safe system.   [name] was instructed to call 911 with any severe reactions post vaccine: Marland Kitchen Difficulty breathing  . Swelling of face and throat  . A fast heartbeat  . A bad rash all over body  . Dizziness and weakness   Immunizations Administered    Name Date Dose VIS Date Route   Pfizer COVID-19 Vaccine 03/28/2020  5:32 PM 0.3 mL 01/09/2020 Intramuscular   Manufacturer: [employer], [employer]   Lot: [id number]   NDC: [address]-0

## 2020-04-15 ENCOUNTER — Ambulatory Visit (INDEPENDENT_AMBULATORY_CARE_PROVIDER_SITE_OTHER): Payer: BC Managed Care – PPO

## 2020-04-15 DIAGNOSIS — I5022 Chronic systolic (congestive) heart failure: Secondary | ICD-10-CM

## 2020-04-15 DIAGNOSIS — I255 Ischemic cardiomyopathy: Secondary | ICD-10-CM | POA: Diagnosis not present

## 2020-04-15 LAB — CUP PACEART REMOTE DEVICE CHECK
Battery Remaining Percentage: 98 %
Date Time Interrogation Session: 20220125105300
Implantable Lead Implant Date: 20211025
Implantable Lead Location: 753860
Implantable Lead Model: 3501
Implantable Lead Serial Number: 198127
Implantable Pulse Generator Implant Date: 20211025
Pulse Gen Serial Number: 146448

## 2020-04-23 ENCOUNTER — Encounter: Payer: Self-pay | Admitting: Internal Medicine

## 2020-04-23 ENCOUNTER — Ambulatory Visit (INDEPENDENT_AMBULATORY_CARE_PROVIDER_SITE_OTHER): Payer: BC Managed Care – PPO | Admitting: Internal Medicine

## 2020-04-23 ENCOUNTER — Other Ambulatory Visit: Payer: Self-pay

## 2020-04-23 VITALS — BP 110/66 | HR 86 | Ht 65.0 in | Wt 129.2 lb

## 2020-04-23 DIAGNOSIS — Z95810 Presence of automatic (implantable) cardiac defibrillator: Secondary | ICD-10-CM | POA: Diagnosis not present

## 2020-04-23 DIAGNOSIS — I5022 Chronic systolic (congestive) heart failure: Secondary | ICD-10-CM | POA: Diagnosis not present

## 2020-04-23 NOTE — Progress Notes (Signed)
HPI James [name] returns today for ongoing followup of his S-ICD. He has an ICM, s/p extensive anterior MI and severe LV dysfunction, EF 30%. He has done well in the interim with no chest pain or sob. No syncope. He has a known occluded LAD.  Allergies  Allergen Reactions  . Vascepa [Icosapent Ethyl]     Heart racing     Current Outpatient Medications  Medication Sig Dispense Refill  . aspirin 81 MG chewable tablet Chew 1 tablet (81 mg total) by mouth daily. 90 tablet 1  . carboxymethylcellul-glycerin (LUBRICANT DROPS/DUAL-ACTION) 0.5-0.9 % ophthalmic solution Place 1 drop into both eyes 3 (three) times daily as needed (irritation).    . Evolocumab (REPATHA SURECLICK) 140 MG/ML SOAJ Inject 1 pen into the skin every 14 (fourteen) days. 2 pen 11  . metoprolol succinate (TOPROL-XL) 25 MG 24 hr tablet TAKE 1/2 TABLET BY MOUTH EVERY DAY 45 tablet 3  . nitroGLYCERIN (NITROSTAT) 0.4 MG SL tablet Place 1 tablet (0.4 mg total) under the tongue every 5 (five) minutes x 3 doses as needed for chest pain. 25 tablet 2  . pantoprazole (PROTONIX) 40 MG tablet TAKE 1 TABLET BY MOUTH EVERY DAY 90 tablet 3  . sacubitril-valsartan (ENTRESTO) 49-51 MG Take 1 tablet by mouth 2 (two) times daily. 180 tablet 3  . ticagrelor (BRILINTA) 90 MG TABS tablet Take 1 tablet (90 mg total) by mouth 2 (two) times daily. 180 tablet 3   No current facility-administered medications for this visit.     Past Medical History:  Diagnosis Date  . Acute combined systolic and diastolic heart failure (HCC) 2018/10/16  . Acute ST elevation myocardial infarction (STEMI) (HCC) 10/12/2018  . At risk for sudden cardiac death Oct 16, 2018  . Cardiomyopathy, ischemic 11/14/2018  . CHF (congestive heart failure) (HCC)   . 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 Coronary artery disease involving native coronary artery of native heart with unstable angina pectoris (HCC) 2018-10-16  . Delayed presentation of acute  anterolateral STEMI 10/12/2018  . Hyperlipidemia    possible elevated triglycerides  . Ischemic cardiomyopathy    EF 30-35% // Echo 11/2018: Anteroseptal and anterior hypokinesis, EF 30-35, small circumferential pericardial effusion, no intracardiac thrombi   . Presence of drug coated stent in LAD coronary artery 10/16/18   Ostial LAD DES PCI: Resolute Onyx 3.5 mm x 18 mm - 3.6 mm  . STEMI (ST elevation myocardial infarction) (HCC) 10/12/2018    ROS:   All systems reviewed and negative except as noted in the HPI.   Past Surgical History:  Procedure Laterality Date  . CARDIAC CATHETERIZATION    . CORONARY/GRAFT ACUTE MI REVASCULARIZATION N/A 10/12/2018   Procedure: CORONARY/GRAFT ACUTE MI REVASCULARIZATION;  Surgeon: Lyn Records, MD;  Location: [facility] INVASIVE CV LAB;  Service: Cardiovascular;  Laterality: N/A;  . LEFT HEART CATH AND CORONARY ANGIOGRAPHY N/A 10/12/2018   Procedure: LEFT HEART CATH AND CORONARY ANGIOGRAPHY;  Surgeon: Lyn Records, MD;  Location: [facility] INVASIVE CV LAB;  Service: Cardiovascular;  Laterality: N/A;  . RIGHT/LEFT HEART CATH AND CORONARY ANGIOGRAPHY N/A 11/13/2018   Procedure: RIGHT/LEFT HEART CATH AND CORONARY ANGIOGRAPHY;  Surgeon: Yvonne Kendall, MD;  Location: [facility] INVASIVE CV LAB;  Service: Cardiovascular;  Laterality: N/A;  . SUBQ ICD IMPLANT N/A 01/14/2020   Procedure: SUBQ ICD IMPLANT;  Surgeon: Marinus Maw, MD;  Location: [facility] INVASIVE CV LAB;  Service: Cardiovascular;  Laterality: N/A;     Family History  Problem Relation Age of Onset  . Thyroid disease Mother   . Thyroid disease Brother   . Diabetes Brother   . Heart disease Neg Hx      Social History   Socioeconomic History  . Marital status: Married    Spouse name: Not on file  . Number of children: Not on file  . Years of education: 31  . Highest education level: Not on file  Occupational History  . Occupation: Primary school teacher: [employer]   Tobacco Use  . Smoking status: Never Smoker   . Smokeless tobacco: Never Used  Substance and Sexual Activity  . Alcohol use: Yes    Alcohol/week: 2.0 standard drinks    Types: 2 Cans of beer per week    Comment: 2 packs/month   . Drug use: Never  . Sexual activity: Not on file  Other Topics Concern  . Not on file  Social History Narrative   Patient lives with wife in [place] and small child   Works in Consulting civil engineer for [employer]   Social Determinants of Corporate investment banker Strain: Not on file  Food Insecurity: Not on file  Transportation Needs: Not on file  Physical Activity: Not on file  Stress: Not on file  Social Connections: Not on file  Intimate Partner Violence: Not on file     BP 110/66   Pulse 86   Ht 5\' 5"  (1.651 m)   Wt 129 lb 3.2 oz (58.6 kg)   SpO2 98%   BMI 21.50 kg/m   Physical Exam:  Well appearing NAD HEENT: Unremarkable Neck:  No JVD, no thyromegally Lymphatics:  No adenopathy Back:  No CVA tenderness Lungs:  Clear with no wheezes HEART:  Regular rate rhythm, no murmurs, no rubs, no clicks Abd:  soft, positive bowel sounds, no organomegally, no rebound, no guarding Ext:  2 plus pulses, no edema, no cyanosis, no clubbing Skin:  No rashes no nodules Neuro:  CN II through XII intact, motor grossly intact  EKG - nsr with anterior MI  DEVICE  Normal device function.  See PaceArt for details.   Assess/Plan: 1. ICD - his [facility] s-icd is working normally. He will undergo watchful waiting. 2. CAD - he denies anginal symptoms. 3. Chronic systolic heart failure - his symptoms are class 2A. He will continue gdmt.   Maren Wiesen,MD

## 2020-04-23 NOTE — Patient Instructions (Signed)
Medication Instructions:  Your physician recommends that you continue on your current medications as directed. Please refer to the Current Medication list given to you today.  Labwork: None ordered.  Testing/Procedures: None ordered.  Follow-Up: Your physician wants you to follow-up in: one year with Lewayne Bunting, MD or one of the following Advanced Practice Providers on your designated Care Team:    Gypsy Balsam, NP  Francis Dowse, PA-C  Casimiro Needle "Mardelle Matte" [place], [place]  Remote monitoring is used to monitor your ICD from home. This monitoring reduces the number of office visits required to check your device to one time per year. It allows [place] to keep an eye on the functioning of your device to ensure it is working properly. You are scheduled for a device check from home on 07/15/2020. You may send your transmission at any time that day. If you have a wireless device, the transmission will be sent automatically. After your physician reviews your transmission, you will receive a postcard with your next transmission date.  Any Other Special Instructions Will Be Listed Below (If Applicable).  If you need a refill on your cardiac medications before your next appointment, please call your pharmacy.

## 2020-04-26 NOTE — Progress Notes (Signed)
Remote ICD transmission.   

## 2020-05-21 ENCOUNTER — Other Ambulatory Visit: Payer: Self-pay | Admitting: *Deleted

## 2020-05-21 ENCOUNTER — Telehealth: Payer: Self-pay | Admitting: *Deleted

## 2020-05-21 NOTE — Telephone Encounter (Signed)
Spoke with pt and made him aware of recommendations.  Pt agreeable to plan.  

## 2020-05-21 NOTE — Telephone Encounter (Signed)
-----   Message from Lyn Records, MD sent at 05/21/2020  2:24 PM EST ----- Regarding: Pantoprazole chronic use Please have patient to discontinue pantoprazole.  If symptoms recur, we should decrease intensity to 20 mg/day.  Hopefully he can do without pantoprazole.  Reason is that chronic downstream's therapy can cause electrolyte disturbance, malabsorption, and osteoporosis.

## 2020-05-31 ENCOUNTER — Other Ambulatory Visit: Payer: Self-pay | Admitting: Interventional Cardiology

## 2020-06-24 ENCOUNTER — Ambulatory Visit: Payer: BC Managed Care – PPO | Admitting: Orthopaedic Surgery

## 2020-07-01 NOTE — Progress Notes (Signed)
Cardiology Office Note:    Date:  07/02/2020   ID:  [name], DOB [birth year], MRN [id number]  PCP:  Patient, No Pcp Per (Inactive)  Cardiologist:  Lesleigh Noe, MD   Referring MD: No ref. provider found   Chief Complaint  Patient presents with  . Coronary Artery Disease  . Congestive Heart Failure    History of Present Illness:    James [name] is a 40 y.o. 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 Seen by EP Dr. Ladona Ridgel and considering S-ICD for primary prevention.  He is doing well.  He thinks that sweets cause tachycardia and palpitations.  He denies heart failure symptoms.  He has not had significant racing or syncope.  No angina.  Still living in [place].  May need to move to [place] back to [place].  He would know about this at some point over the next several months.  Past Medical History:  Diagnosis Date  . Acute combined systolic and diastolic heart failure (HCC) 19-Oct-2018  . Acute ST elevation myocardial infarction (STEMI) (HCC) 10/12/2018  . At risk for sudden cardiac death Oct 19, 2018  . Cardiomyopathy, ischemic 11/14/2018  . CHF (congestive heart failure) (HCC)   . 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 Coronary artery disease involving native coronary artery of native heart with unstable angina pectoris (HCC) Oct 19, 2018  . Delayed presentation of acute anterolateral STEMI 10/12/2018  . Hyperlipidemia    possible elevated triglycerides  . Ischemic cardiomyopathy    EF 30-35% // Echo 11/2018: Anteroseptal and anterior hypokinesis, EF 30-35, small circumferential pericardial effusion, no intracardiac thrombi   . Presence of drug coated stent in LAD coronary artery Oct 19, 2018   Ostial LAD DES PCI: Resolute Onyx 3.5 mm x 18 mm - 3.6 mm  . STEMI  (ST elevation myocardial infarction) (HCC) 10/12/2018    Past Surgical History:  Procedure Laterality Date  . CARDIAC CATHETERIZATION    . CORONARY/GRAFT ACUTE MI REVASCULARIZATION N/A 10/12/2018   Procedure: CORONARY/GRAFT ACUTE MI REVASCULARIZATION;  Surgeon: Lyn Records, MD;  Location: [facility] INVASIVE CV LAB;  Service: Cardiovascular;  Laterality: N/A;  . LEFT HEART CATH AND CORONARY ANGIOGRAPHY N/A 10/12/2018   Procedure: LEFT HEART CATH AND CORONARY ANGIOGRAPHY;  Surgeon: Lyn Records, MD;  Location: [facility] INVASIVE CV LAB;  Service: Cardiovascular;  Laterality: N/A;  . RIGHT/LEFT HEART CATH AND CORONARY ANGIOGRAPHY N/A 11/13/2018   Procedure: RIGHT/LEFT HEART CATH AND CORONARY ANGIOGRAPHY;  Surgeon: Yvonne Kendall, MD;  Location: [facility] INVASIVE CV LAB;  Service: Cardiovascular;  Laterality: N/A;  . SUBQ ICD IMPLANT N/A 01/14/2020   Procedure: SUBQ ICD IMPLANT;  Surgeon: Marinus Maw, MD;  Location: [facility] INVASIVE CV LAB;  Service: Cardiovascular;  Laterality: N/A;    Current Medications: Current Meds  Medication Sig  . aspirin 81 MG chewable tablet Chew 1 tablet (81 mg total) by mouth daily.  . carboxymethylcellul-glycerin (LUBRICANT DROPS/DUAL-ACTION) 0.5-0.9 % ophthalmic solution Place 1 drop into both eyes 3 (three) times daily as needed (irritation).  . metoprolol succinate (TOPROL-XL) 25 MG 24 hr tablet TAKE 1/2 TABLET BY MOUTH EVERY DAY  . nitroGLYCERIN (NITROSTAT) 0.4 MG SL tablet Place 1 tablet (0.4 mg total) under the tongue every 5 (five) minutes x 3 doses as needed for chest pain.  Marland Kitchen REPATHA  SURECLICK 140 MG/ML SOAJ INJECT CONTENTS OF 1 PEN INTO THE SKIN EVERY 14 (FOURTEEN) DAYS.  Marland Kitchen sacubitril-valsartan (ENTRESTO) 49-51 MG Take 1 tablet by mouth 2 (two) times daily.  . ticagrelor (BRILINTA) 90 MG TABS tablet Take 1 tablet (90 mg total) by mouth 2 (two) times daily.     Allergies:   Vascepa [icosapent ethyl]   Social History   Socioeconomic History  . Marital status: Married     Spouse name: Not on file  . Number of children: Not on file  . Years of education: 45  . Highest education level: Not on file  Occupational History  . Occupation: Primary school teacher: [employer]   Tobacco Use  . Smoking status: Never Smoker  . Smokeless tobacco: Never Used  Substance and Sexual Activity  . Alcohol use: Yes    Alcohol/week: 2.0 standard drinks    Types: 2 Cans of beer per week    Comment: 2 packs/month   . Drug use: Never  . Sexual activity: Not on file  Other Topics Concern  . Not on file  Social History Narrative   Patient lives with wife in [place] and small child   Works in Consulting civil engineer for [employer]   Social Determinants of Corporate investment banker Strain: Not on file  Food Insecurity: Not on file  Transportation Needs: Not on file  Physical Activity: Not on file  Stress: Not on file  Social Connections: Not on file     Family History: The patient's family history includes Diabetes in his brother; Thyroid disease in his brother and mother. There is no history of Heart disease.  ROS:   Please see the history of present illness.    He is exercising some without difficulty all other systems reviewed and are negative.  EKGs/Labs/Other Studies Reviewed:    The following studies were reviewed today: No new imaging data  EKG:  EKG not performed  Recent Labs: 07/16/2019: ALT 16 01/11/2020: BUN 14; Creatinine, Ser 1.00; Hemoglobin 15.0; Platelets 219; Potassium 4.2; Sodium 139  Recent Lipid Panel    Component Value Date/Time   CHOL 90 (L) 08/21/2019 0844   TRIG 76 08/21/2019 0844   HDL 43 08/21/2019 0844   CHOLHDL 2.1 08/21/2019 0844   CHOLHDL 4.6 10/13/2018 0317   VLDL 35 10/13/2018 0317   LDLCALC 31 08/21/2019 0844   LDLDIRECT 35 08/21/2019 0844    Physical Exam:    VS:  BP 108/68   Pulse 86   Ht 5\' 5"  (1.651 m)   Wt 127 lb 12.8 oz (58 kg)   SpO2 98%   BMI 21.27 kg/m     Wt Readings from Last 3 Encounters:  07/02/20 127 lb 12.8 oz (58  kg)  04/23/20 129 lb 3.2 oz (58.6 kg)  01/25/20 122 lb 12.8 oz (55.7 kg)     GEN: Healthy-appearing. No acute distress HEENT: Normal NECK: No JVD. LYMPHATICS: No lymphadenopathy CARDIAC: No murmur. RRR no gallop, or edema. VASCULAR:  Normal Pulses. No bruits. RESPIRATORY:  Clear to auscultation without rales, wheezing or rhonchi  ABDOMEN: Soft, non-tender, non-distended, No pulsatile mass, MUSCULOSKELETAL: Site of subcu ICD is noted in the left mid axillary region.  No fluctuance is noted. SKIN: Warm and dry NEUROLOGIC:  Alert and oriented x 3 PSYCHIATRIC:  Normal affect   ASSESSMENT:    1. Chronic systolic heart failure (HCC)   2. ICD (implantable cardioverter-defibrillator) in place   3. Coronary artery disease involving native coronary artery of  native heart without angina pectoris   4. Hyperlipidemia with target LDL less than 70   5. Essential hypertension    PLAN:    In order of problems listed above:  1. Continue beta-blocker and Entresto.  Consider adding [place] or Jardiance.  We will see what the next echo looks like. 2. Followed by Dr. Ladona Ridgel. 3. Will need coronary CT angio done to follow right coronary to pulmonary artery fistula. 4. Continue evolocumab. 5. Blood pressure is under excellent control.  Clinical follow-up in 6 months.  May further adjust heart failure therapy depending upon the echo that we do this summer.   Medication Adjustments/Labs and Tests Ordered: Current medicines are reviewed at length with the patient today.  Concerns regarding medicines are outlined above.  No orders of the defined types were placed in this encounter.  No orders of the defined types were placed in this encounter.   There are no Patient Instructions on file for this visit.   Signed, Lesleigh Noe, MD  07/02/2020 3:53 PM    [facility]

## 2020-07-02 ENCOUNTER — Other Ambulatory Visit: Payer: Self-pay

## 2020-07-02 ENCOUNTER — Ambulatory Visit (INDEPENDENT_AMBULATORY_CARE_PROVIDER_SITE_OTHER): Payer: BC Managed Care – PPO | Admitting: Interventional Cardiology

## 2020-07-02 ENCOUNTER — Encounter: Payer: Self-pay | Admitting: Interventional Cardiology

## 2020-07-02 VITALS — BP 108/68 | HR 86 | Ht 65.0 in | Wt 127.8 lb

## 2020-07-02 DIAGNOSIS — I5022 Chronic systolic (congestive) heart failure: Secondary | ICD-10-CM

## 2020-07-02 DIAGNOSIS — E785 Hyperlipidemia, unspecified: Secondary | ICD-10-CM | POA: Diagnosis not present

## 2020-07-02 DIAGNOSIS — I2510 Atherosclerotic heart disease of native coronary artery without angina pectoris: Secondary | ICD-10-CM | POA: Diagnosis not present

## 2020-07-02 DIAGNOSIS — Z95810 Presence of automatic (implantable) cardiac defibrillator: Secondary | ICD-10-CM | POA: Diagnosis not present

## 2020-07-02 DIAGNOSIS — I10 Essential (primary) hypertension: Secondary | ICD-10-CM

## 2020-07-02 MED ORDER — CLOPIDOGREL BISULFATE 75 MG PO TABS: 75.0000 mg | ORAL_TABLET | Freq: Every day | ORAL | 3 refills | Status: DC

## 2020-07-02 NOTE — Patient Instructions (Signed)
Medication Instructions:  1) DISCONTINUE Brilinta 2) START Plavix 75mg  once daily.  Tomorrow, take 2 tablets to equal 150mg  and then take one tablet thereafter.  *If you need a refill on your cardiac medications before your next appointment, please call your pharmacy*   Lab Work: BMET, Lipid, Liver and CBC when you are fasting.  Would like to have done before the end of the month.  You will need to be fasting for these labs (nothing to eat or drink after midnight except water and black coffee).  If you have labs (blood work) drawn today and your tests are completely normal, you will receive your results only by: MyChart Message (if you have MyChart) OR . A paper copy in the mail If you have any lab test that is abnormal or we need to change your treatment, we will call you to review the results.   Testing/Procedures: Your physician has requested that you have an echocardiogram sometime this summer. Echocardiography is a painless test that uses sound waves to create images of your heart. It provides your doctor with information about the size and shape of your heart and how well your heart's chambers and valves are working. This procedure takes approximately one hour. There are no restrictions for this procedure.   Follow-Up: At [facility], you and your health needs are our priority.  As part of our continuing mission to provide you with exceptional heart care, we have created designated Provider Care Teams.  These Care Teams include your primary Cardiologist (physician) and Advanced Practice Providers (APPs -  Physician Assistants and Nurse Practitioners) who all work together to provide you with the care you need, when you need it.  We recommend signing up for the patient portal called "MyChart".  Sign up information is provided on this After Visit Summary.  MyChart is used to connect with patients for Virtual Visits (Telemedicine).  Patients are able to view lab/test results, encounter  notes, upcoming appointments, etc.  Non-urgent messages can be sent to your provider as well.   To learn more about what you can do with MyChart, go to 01-16-1976.    Your next appointment:   6 month(s)  The format for your next appointment:   In Person  Provider:   You may see [facility], MD or one of the following Advanced Practice Providers on your designated Care Team:    [email or web address], NP    Other Instructions

## 2020-07-08 ENCOUNTER — Ambulatory Visit: Payer: BC Managed Care – PPO | Admitting: Orthopaedic Surgery

## 2020-07-11 ENCOUNTER — Other Ambulatory Visit: Payer: BC Managed Care – PPO | Admitting: *Deleted

## 2020-07-11 ENCOUNTER — Other Ambulatory Visit: Payer: Self-pay

## 2020-07-11 DIAGNOSIS — I2510 Atherosclerotic heart disease of native coronary artery without angina pectoris: Secondary | ICD-10-CM | POA: Diagnosis not present

## 2020-07-11 DIAGNOSIS — I10 Essential (primary) hypertension: Secondary | ICD-10-CM

## 2020-07-11 DIAGNOSIS — E785 Hyperlipidemia, unspecified: Secondary | ICD-10-CM | POA: Diagnosis not present

## 2020-07-11 LAB — BASIC METABOLIC PANEL
BUN/Creatinine Ratio: 12 (ref 9–20)
BUN: 13 mg/dL (ref 6–20)
CO2: 24 mmol/L (ref 20–29)
Calcium: 9.3 mg/dL (ref 8.7–10.2)
Chloride: 103 mmol/L (ref 96–106)
Creatinine, Ser: 1.13 mg/dL (ref 0.76–1.27)
Glucose: 92 mg/dL (ref 65–99)
Potassium: 4.6 mmol/L (ref 3.5–5.2)
Sodium: 140 mmol/L (ref 134–144)
eGFR: 85 mL/min/{1.73_m2} (ref >59)

## 2020-07-11 LAB — LIPID PANEL
Chol/HDL Ratio: 2.7 ratio (ref 0.0–5.0)
Cholesterol, Total: 108 mg/dL (ref 100–199)
HDL: 40 mg/dL (ref >39)
LDL Chol Calc (NIH): 35 mg/dL (ref 0–99)
Triglycerides: 205 mg/dL — ABNORMAL HIGH (ref 0–149)
VLDL Cholesterol Cal: 33 mg/dL (ref 5–40)

## 2020-07-11 LAB — HEPATIC FUNCTION PANEL
ALT: 20 IU/L (ref 0–44)
AST: 18 IU/L (ref 0–40)
Albumin: 4.6 g/dL (ref 4.0–5.0)
Alkaline Phosphatase: 71 IU/L (ref 44–121)
Bilirubin Total: 0.7 mg/dL (ref 0.0–1.2)
Bilirubin, Direct: 0.16 mg/dL (ref 0.00–0.40)
Total Protein: 7 g/dL (ref 6.0–8.5)

## 2020-07-11 LAB — CBC
Hematocrit: 44.9 % (ref 37.5–51.0)
Hemoglobin: 15.7 g/dL (ref 13.0–17.7)
MCH: 29.8 pg (ref 26.6–33.0)
MCHC: 35 g/dL (ref 31.5–35.7)
MCV: 85 fL (ref 79–97)
Platelets: 195 10*3/uL (ref 150–450)
RBC: 5.26 x10E6/uL (ref 4.14–5.80)
RDW: 13.3 % (ref 11.6–15.4)
WBC: 5.6 10*3/uL (ref 3.4–10.8)

## 2020-07-15 ENCOUNTER — Ambulatory Visit (INDEPENDENT_AMBULATORY_CARE_PROVIDER_SITE_OTHER): Payer: BC Managed Care – PPO

## 2020-07-15 DIAGNOSIS — I255 Ischemic cardiomyopathy: Secondary | ICD-10-CM | POA: Diagnosis not present

## 2020-07-15 LAB — CUP PACEART REMOTE DEVICE CHECK
Battery Remaining Percentage: 95 %
Date Time Interrogation Session: 20220425095600
Implantable Lead Implant Date: 20211025
Implantable Lead Location: 753860
Implantable Lead Model: 3501
Implantable Lead Serial Number: 198127
Implantable Pulse Generator Implant Date: 20211025
Pulse Gen Serial Number: 146448

## 2020-07-22 ENCOUNTER — Ambulatory Visit (INDEPENDENT_AMBULATORY_CARE_PROVIDER_SITE_OTHER): Payer: BC Managed Care – PPO | Admitting: Orthopaedic Surgery

## 2020-07-22 ENCOUNTER — Ambulatory Visit: Payer: Self-pay

## 2020-07-22 ENCOUNTER — Other Ambulatory Visit: Payer: Self-pay

## 2020-07-22 DIAGNOSIS — M25512 Pain in left shoulder: Secondary | ICD-10-CM | POA: Diagnosis not present

## 2020-07-22 DIAGNOSIS — G8929 Other chronic pain: Secondary | ICD-10-CM | POA: Diagnosis not present

## 2020-07-22 DIAGNOSIS — M25562 Pain in left knee: Secondary | ICD-10-CM | POA: Diagnosis not present

## 2020-07-22 NOTE — Progress Notes (Signed)
Office Visit Note   Patient: James [name]           Date of Birth: December 09, 1980           MRN: [id number] Visit Date: 07/22/2020              Requested by: No referring provider defined for this encounter. PCP: Patient, No Pcp Per (Inactive)   Assessment & Plan: Visit Diagnoses:  1. Chronic pain of left knee   2. Chronic left shoulder pain     Plan: Impression is asymptomatic left knee popping and left shoulder mild rotator cuff tendinitis.  We have provided him exercises for both conditions.  I have also provided him with topical anti-inflammatories as he is unable to take oral NSAIDs.  Follow-up with [place] as needed.  Follow-Up Instructions: Return if symptoms worsen or fail to improve.   Orders:  Orders Placed This Encounter  Procedures  . XR KNEE 3 VIEW LEFT  . XR Shoulder Left   No orders of the defined types were placed in this encounter.     Procedures: No procedures performed   Clinical Data: No additional findings.   Subjective: Chief Complaint  Patient presents with  . Left Knee - Pain    HPI patient is a pleasant 40 year old gentleman who comes in today with concerns about his left knee and left shoulder.  In regards to his left knee, he has had popping to the anteromedial aspect for the past 3 to 4 years which became more constant this past summer and then more intermittent again this past fall.  There is no pain associated with this but thinks that spicy food aggravates his symptoms.  His wife just wanted him to have this checked out.  In regards to his left shoulder, we will he has mild discomfort to the proximal deltoid with shoulder abduction.  This pain began after having heart surgery and not immobilizing his shoulder for several months.  Review of Systems as detailed in HPI.  All others reviewed and are negative   Objective: Vital Signs: There were no vitals taken for this visit.  Physical Exam.  Well-developed well-nourished gentleman in  no acute distress.  Alert and oriented x3.  Ortho Exam left shoulder exam shows near full active range of motion all planes.  Internal rotation to L5.  Negative empty can.  Negative cross body adduction.  He is neurovascular intact distally.  Specialty Comments:  No specialty comments available.  Imaging:    PMFS History: Patient Active Problem List   Diagnosis Date Noted  . ICD (implantable cardioverter-defibrillator) in place 04/23/2020  . Chronic systolic heart failure (HCC) 11/29/2019  . Ischemic cardiomyopathy 11/14/2018  . Hx of ST elevation myocardial infarction 09/2018 11/14/2018  . Coronary artery anomaly, congenital   . Unstable angina (HCC)   . Precordial chest pain 11/11/2018  . Acute combined systolic and diastolic heart failure (HCC) November 10, 2018  . Coronary artery disease involving native coronary artery of native heart with unstable angina pectoris (HCC) 10-Nov-2018  . Presence of drug coated stent in LAD coronary artery 11-10-18  . Hyperlipidemia with target LDL less than 70 11/10/18  . At risk for sudden cardiac death 11/10/2018  . Acute ST elevation myocardial infarction (STEMI) (HCC) 10/12/2018  . 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 Non-ST elevation (NSTEMI) myocardial infarction (HCC) 10/12/2018  . Delayed presentation of acute anterolateral STEMI 10/12/2018   Past Medical History:  Diagnosis Date  . Acute  combined systolic and diastolic heart failure (HCC) 10/18/2018  . Acute ST elevation myocardial infarction (STEMI) (HCC) 10/12/2018  . At risk for sudden cardiac death Oct 18, 2018  . Cardiomyopathy, ischemic 11/14/2018  . CHF (congestive heart failure) (HCC)   . 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 Coronary artery disease involving native coronary artery of native heart with unstable angina pectoris (HCC) Oct 18, 2018  . Delayed presentation of acute anterolateral STEMI 10/12/2018  . Hyperlipidemia     possible elevated triglycerides  . Ischemic cardiomyopathy    EF 30-35% // Echo 11/2018: Anteroseptal and anterior hypokinesis, EF 30-35, small circumferential pericardial effusion, no intracardiac thrombi   . Presence of drug coated stent in LAD coronary artery 18-Oct-2018   Ostial LAD DES PCI: Resolute Onyx 3.5 mm x 18 mm - 3.6 mm  . STEMI (ST elevation myocardial infarction) (HCC) 10/12/2018    Family History  Problem Relation Age of Onset  . Thyroid disease Mother   . Thyroid disease Brother   . Diabetes Brother   . Heart disease Neg Hx     Past Surgical History:  Procedure Laterality Date  . CARDIAC CATHETERIZATION    . CORONARY/GRAFT ACUTE MI REVASCULARIZATION N/A 10/12/2018   Procedure: CORONARY/GRAFT ACUTE MI REVASCULARIZATION;  Surgeon: Lyn Records, MD;  Location: [facility] INVASIVE CV LAB;  Service: Cardiovascular;  Laterality: N/A;  . LEFT HEART CATH AND CORONARY ANGIOGRAPHY N/A 10/12/2018   Procedure: LEFT HEART CATH AND CORONARY ANGIOGRAPHY;  Surgeon: Lyn Records, MD;  Location: [facility] INVASIVE CV LAB;  Service: Cardiovascular;  Laterality: N/A;  . RIGHT/LEFT HEART CATH AND CORONARY ANGIOGRAPHY N/A 11/13/2018   Procedure: RIGHT/LEFT HEART CATH AND CORONARY ANGIOGRAPHY;  Surgeon: Yvonne Kendall, MD;  Location: [facility] INVASIVE CV LAB;  Service: Cardiovascular;  Laterality: N/A;  . SUBQ ICD IMPLANT N/A 01/14/2020   Procedure: SUBQ ICD IMPLANT;  Surgeon: Marinus Maw, MD;  Location: [facility] INVASIVE CV LAB;  Service: Cardiovascular;  Laterality: N/A;   Social History   Occupational History  . Occupation: Primary school teacher: [employer]   Tobacco Use  . Smoking status: Never Smoker  . Smokeless tobacco: Never Used  Substance and Sexual Activity  . Alcohol use: Yes    Alcohol/week: 2.0 standard drinks    Types: 2 Cans of beer per week    Comment: 2 packs/month   . Drug use: Never  . Sexual activity: Not on file

## 2020-08-04 NOTE — Progress Notes (Signed)
Remote ICD transmission.   

## 2020-08-07 ENCOUNTER — Telehealth (HOSPITAL_COMMUNITY): Payer: Self-pay | Admitting: Interventional Cardiology

## 2020-08-07 NOTE — Telephone Encounter (Signed)
Patient called and cancelled echocardiogram and did not wish to reschedule at this time. Order will be removed from the ECHO Wq and if patient calls back to reschedule we will reinstate the order. Thank you.

## 2020-08-08 ENCOUNTER — Other Ambulatory Visit (HOSPITAL_COMMUNITY): Payer: BC Managed Care – PPO

## 2020-08-08 ENCOUNTER — Encounter (HOSPITAL_COMMUNITY): Payer: Self-pay

## 2020-08-26 ENCOUNTER — Other Ambulatory Visit: Payer: Self-pay | Admitting: Interventional Cardiology

## 2020-10-14 ENCOUNTER — Ambulatory Visit (INDEPENDENT_AMBULATORY_CARE_PROVIDER_SITE_OTHER): Payer: BC Managed Care – PPO

## 2020-10-14 DIAGNOSIS — I255 Ischemic cardiomyopathy: Secondary | ICD-10-CM | POA: Diagnosis not present

## 2020-10-14 LAB — CUP PACEART REMOTE DEVICE CHECK
Battery Remaining Percentage: 92 %
Date Time Interrogation Session: 20220725184400
Implantable Lead Implant Date: 20211025
Implantable Lead Location: 753860
Implantable Lead Model: 3501
Implantable Lead Serial Number: 198127
Implantable Pulse Generator Implant Date: 20211025
Pulse Gen Serial Number: 146448

## 2020-11-07 NOTE — Progress Notes (Signed)
Remote ICD transmission.   

## 2020-11-11 ENCOUNTER — Encounter: Payer: Self-pay | Admitting: Orthopaedic Surgery

## 2020-11-11 ENCOUNTER — Other Ambulatory Visit: Payer: Self-pay

## 2020-11-11 ENCOUNTER — Ambulatory Visit (INDEPENDENT_AMBULATORY_CARE_PROVIDER_SITE_OTHER): Payer: BC Managed Care – PPO | Admitting: Orthopaedic Surgery

## 2020-11-11 DIAGNOSIS — G8929 Other chronic pain: Secondary | ICD-10-CM

## 2020-11-11 DIAGNOSIS — M25512 Pain in left shoulder: Secondary | ICD-10-CM

## 2020-11-11 MED ORDER — BUPIVACAINE HCL 0.25 % IJ SOLN
2.0000 mL | INTRAMUSCULAR | Status: AC | PRN
  Administered 2020-11-11: 2 mL via INTRA_ARTICULAR

## 2020-11-11 MED ORDER — METHYLPREDNISOLONE ACETATE 40 MG/ML IJ SUSP
40.0000 mg | INTRAMUSCULAR | Status: AC | PRN
  Administered 2020-11-11: 40 mg via INTRA_ARTICULAR

## 2020-11-11 MED ORDER — LIDOCAINE HCL 2 % IJ SOLN
2.0000 mL | INTRAMUSCULAR | Status: AC | PRN
  Administered 2020-11-11: 2 mL

## 2020-11-11 NOTE — Progress Notes (Signed)
Office Visit Note   Patient: James [name]           Date of Birth: 21-Aug-1980           MRN: [id number] Visit Date: 11/11/2020              Requested by: No referring provider defined for this encounter. PCP: Patient, No Pcp Per (Inactive)   Assessment & Plan: Visit Diagnoses:  1. Chronic left shoulder pain     Plan: Impression is chronic left shoulder pain.  I really do not feel his symptoms are coming from her frozen shoulder at this point in time.  I feel as though his symptoms are more from bursitis.  We have discussed subacromial cortisone injection.  Should he not get any relief over the next 3 to 4 weeks, he will let us know we will order an MRI to assess for structural abnormalities.  Otherwise, follow-up with [place] as needed.  Follow-Up Instructions: Return if symptoms worsen or fail to improve.   Orders:  Orders Placed This Encounter  Procedures   Large Joint Inj    No orders of the defined types were placed in this encounter.     Procedures: Large Joint Inj: L subacromial bursa on 11/11/2020 3:39 PM Indications: pain Details: 22 G needle Medications: 2 mL lidocaine 2 %; 2 mL bupivacaine 0.25 %; 40 mg methylPREDNISolone acetate 40 MG/ML Outcome: tolerated well, no immediate complications Patient was prepped and draped in the usual sterile fashion.      Clinical Data: No additional findings.   Subjective: Chief Complaint  Patient presents with   Left Shoulder - Pain    HPI patient is a pleasant 40 year old gentleman who comes in today with continued left shoulder pain which is progressively worsened over the past few months.  He denies any specific injury but notes that the pain began after having heart surgery and not using his shoulder.  He was seen in our office almost 4 months ago for this.  We discussed injection, oral and topical anti-inflammatories.  He is unable to take oral anti-inflammatories so he opted for the topical anti-inflammatories.   He has not noticed any relief.  Pain is worse with any motion of the shoulder.  Review of Systems as detailed in HPI.  All others reviewed and are negative.   Objective: Vital Signs: There were no vitals taken for this visit.  Physical Exam well-developed well-nourished gentleman in no acute distress.  Alert and oriented x3.  Ortho Exam left shoulder exam reveals forward flexion and abduction to approximately 160 degrees.  He can internally rotate to L5.  Negative empty can and negative cross body adduction.  Negative speeds negative O'Brien's.  Near full strength throughout.  He is neurovascular intact distally.  Specialty Comments:  No specialty comments available.  Imaging: No new imaging   PMFS History: Patient Active Problem List   Diagnosis Date Noted   ICD (implantable cardioverter-defibrillator) in place 04/23/2020   Chronic systolic heart failure (HCC) 11/29/2019   Ischemic cardiomyopathy 11/14/2018   Hx of ST elevation myocardial infarction 09/2018 11/14/2018   Coronary artery anomaly, congenital    Unstable angina (HCC)    Precordial chest pain 11/11/2018   Acute combined systolic and diastolic heart failure (HCC) 10/13/2018   Coronary artery disease involving native coronary artery of native heart with unstable angina pectoris (HCC) 10/13/2018   Presence of drug coated stent in LAD coronary artery 10/13/2018   Hyperlipidemia with target LDL less  than 70 10/15/18   At risk for sudden cardiac death 10-15-18   Acute ST elevation myocardial infarction (STEMI) (HCC) 10/12/2018   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/12/2018   Non-ST elevation (NSTEMI) myocardial infarction [facility]) 10/12/2018   Delayed presentation of acute anterolateral STEMI 10/12/2018   Past Medical History:  Diagnosis Date   Acute combined systolic and diastolic heart failure (HCC) 10/15/2018   Acute ST elevation myocardial infarction (STEMI) (HCC) 10/12/2018   At risk for  sudden cardiac death 15-Oct-2018   Cardiomyopathy, ischemic 11/14/2018   CHF (congestive heart failure) (HCC)    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    Coronary artery disease involving native coronary artery of native heart with unstable angina pectoris (HCC) 2018-10-15   Delayed presentation of acute anterolateral STEMI 10/12/2018   Hyperlipidemia    possible elevated triglycerides   Ischemic cardiomyopathy    EF 30-35% // Echo 11/2018: Anteroseptal and anterior hypokinesis, EF 30-35, small circumferential pericardial effusion, no intracardiac thrombi    Presence of drug coated stent in LAD coronary artery 10/15/18   Ostial LAD DES PCI: Resolute Onyx 3.5 mm x 18 mm - 3.6 mm   STEMI (ST elevation myocardial infarction) (HCC) 10/12/2018    Family History  Problem Relation Age of Onset   Thyroid disease Mother    Thyroid disease Brother    Diabetes Brother    Heart disease Neg Hx     Past Surgical History:  Procedure Laterality Date   CARDIAC CATHETERIZATION     CORONARY/GRAFT ACUTE MI REVASCULARIZATION N/A 10/12/2018   Procedure: CORONARY/GRAFT ACUTE MI REVASCULARIZATION;  Surgeon: Lyn Records, MD;  Location: [facility] INVASIVE CV LAB;  Service: Cardiovascular;  Laterality: N/A;   LEFT HEART CATH AND CORONARY ANGIOGRAPHY N/A 10/12/2018   Procedure: LEFT HEART CATH AND CORONARY ANGIOGRAPHY;  Surgeon: Lyn Records, MD;  Location: [facility] INVASIVE CV LAB;  Service: Cardiovascular;  Laterality: N/A;   RIGHT/LEFT HEART CATH AND CORONARY ANGIOGRAPHY N/A 11/13/2018   Procedure: RIGHT/LEFT HEART CATH AND CORONARY ANGIOGRAPHY;  Surgeon: Yvonne Kendall, MD;  Location: [facility] INVASIVE CV LAB;  Service: Cardiovascular;  Laterality: N/A;   SUBQ ICD IMPLANT N/A 01/14/2020   Procedure: SUBQ ICD IMPLANT;  Surgeon: Marinus Maw, MD;  Location: [facility] INVASIVE CV LAB;  Service: Cardiovascular;  Laterality: N/A;   Social History   Occupational History   Occupation: Primary school teacher: [employer]    Tobacco Use   Smoking status: Never   Smokeless tobacco: Never  Substance and Sexual Activity   Alcohol use: Yes    Alcohol/week: 2.0 standard drinks    Types: 2 Cans of beer per week    Comment: 2 packs/month    Drug use: Never   Sexual activity: Not on file

## 2020-11-25 ENCOUNTER — Telehealth (HOSPITAL_COMMUNITY): Payer: Self-pay | Admitting: Interventional Cardiology

## 2020-11-25 ENCOUNTER — Ambulatory Visit (HOSPITAL_COMMUNITY): Payer: BC Managed Care – PPO

## 2020-11-25 ENCOUNTER — Emergency Department (HOSPITAL_COMMUNITY)
Admission: EM | Admit: 2020-11-25 | Discharge: 2020-11-25 | Disposition: A | Discharge status: Home / Self Care | Payer: BC Managed Care – PPO | Attending: Emergency Medicine | Admitting: Emergency Medicine

## 2020-11-25 ENCOUNTER — Emergency Department (HOSPITAL_COMMUNITY): Payer: BC Managed Care – PPO

## 2020-11-25 DIAGNOSIS — Z955 Presence of coronary angioplasty implant and graft: Secondary | ICD-10-CM | POA: Diagnosis not present

## 2020-11-25 DIAGNOSIS — I5041 Acute combined systolic (congestive) and diastolic (congestive) heart failure: Secondary | ICD-10-CM | POA: Insufficient documentation

## 2020-11-25 DIAGNOSIS — I25119 Atherosclerotic heart disease of native coronary artery with unspecified angina pectoris: Secondary | ICD-10-CM | POA: Insufficient documentation

## 2020-11-25 DIAGNOSIS — Z7982 Long term (current) use of aspirin: Secondary | ICD-10-CM | POA: Insufficient documentation

## 2020-11-25 DIAGNOSIS — I10 Essential (primary) hypertension: Secondary | ICD-10-CM | POA: Diagnosis not present

## 2020-11-25 DIAGNOSIS — Z7902 Long term (current) use of antithrombotics/antiplatelets: Secondary | ICD-10-CM | POA: Diagnosis not present

## 2020-11-25 DIAGNOSIS — M25512 Pain in left shoulder: Secondary | ICD-10-CM | POA: Diagnosis not present

## 2020-11-25 DIAGNOSIS — I110 Hypertensive heart disease with heart failure: Secondary | ICD-10-CM | POA: Diagnosis not present

## 2020-11-25 DIAGNOSIS — R202 Paresthesia of skin: Secondary | ICD-10-CM | POA: Insufficient documentation

## 2020-11-25 DIAGNOSIS — Z79899 Other long term (current) drug therapy: Secondary | ICD-10-CM | POA: Insufficient documentation

## 2020-11-25 DIAGNOSIS — R079 Chest pain, unspecified: Secondary | ICD-10-CM | POA: Diagnosis not present

## 2020-11-25 LAB — CBC
HCT: 44.5 % (ref 39.0–52.0)
Hemoglobin: 14.8 g/dL (ref 13.0–17.0)
MCH: 28.7 pg (ref 26.0–34.0)
MCHC: 33.3 g/dL (ref 30.0–36.0)
MCV: 86.4 fL (ref 80.0–100.0)
Platelets: 224 10*3/uL (ref 150–400)
RBC: 5.15 MIL/uL (ref 4.22–5.81)
RDW: 12.4 % (ref 11.5–15.5)
WBC: 8.9 10*3/uL (ref 4.0–10.5)
nRBC: 0 % (ref 0.0–0.2)

## 2020-11-25 LAB — BASIC METABOLIC PANEL
Anion gap: 5 (ref 5–15)
BUN: 12 mg/dL (ref 6–20)
CO2: 26 mmol/L (ref 22–32)
Calcium: 9.3 mg/dL (ref 8.9–10.3)
Chloride: 102 mmol/L (ref 98–111)
Creatinine, Ser: 1 mg/dL (ref 0.61–1.24)
GFR, Estimated: >60 mL/min (ref >60)
Glucose, Bld: 96 mg/dL (ref 70–99)
Potassium: 4.3 mmol/L (ref 3.5–5.1)
Sodium: 133 mmol/L — ABNORMAL LOW (ref 135–145)

## 2020-11-25 LAB — TROPONIN I (HIGH SENSITIVITY): Troponin I (High Sensitivity): 3 ng/L (ref <18)

## 2020-11-25 NOTE — ED Triage Notes (Signed)
Patient complains of tingling on the left side of his head that he noticed this morning at 0530. Denies pain. Denies any other complaints.

## 2020-11-25 NOTE — Discharge Instructions (Addendum)
The neurology office should call you to try and set up an appointment.  Please return if the rash appears or if you notice that 1 side of your face is weak or if you have weakness or numbness to your arms or legs or difficulty speaking or swallowing.  Please follow-up with your cardiologist and orthopedist.

## 2020-11-25 NOTE — ED Provider Notes (Signed)
[facility] EMERGENCY DEPARTMENT Provider Note   CSN: [id number] Arrival date & time: 11/25/20  1129     History Chief Complaint  Patient presents with   Tingling    James [name] is a 40 y.o. male.  40 yo M with a chief complaints of tingling to the left side of his head just above the ear.  This lasted for about 2 hours off and on and then resolved.  The patient has been researching this online and was concerned that he may have had a stroke and so came in to be evaluated.  He denied one-sided numbness or weakness denies difficulty with speech or swallowing.  Denied headache neck pain.  Denies rash.  Denies ear pain.  He had a dental cleaning done last week denies any local anesthetic used.  Denies any ongoing dental pain.  He has been having left shoulder pain after having a implanted pacemaker defibrillator.  Has seen orthopedics and is scheduled to get an MRI performed.  The history is provided by the patient.  Illness Severity:  Mild Onset quality:  Sudden Duration:  2 hours Timing:  Rare Progression:  Resolved Chronicity:  New Associated symptoms: no abdominal pain, no chest pain, no congestion, no diarrhea, no fever, no headaches, no myalgias, no rash, no shortness of breath and no vomiting       Past Medical History:  Diagnosis Date   Acute combined systolic and diastolic heart failure (HCC) October 22, 2018   Acute ST elevation myocardial infarction (STEMI) (HCC) 10/12/2018   At risk for sudden cardiac death 2018/10/22   Cardiomyopathy, ischemic 11/14/2018   CHF (congestive heart failure) (HCC)    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    Coronary artery disease involving native coronary artery of native heart with unstable angina pectoris (HCC) 10/22/2018   Delayed presentation of acute anterolateral STEMI 10/12/2018   Hyperlipidemia    possible elevated triglycerides   Ischemic cardiomyopathy    EF 30-35% // Echo 11/2018: Anteroseptal  and anterior hypokinesis, EF 30-35, small circumferential pericardial effusion, no intracardiac thrombi    Presence of drug coated stent in LAD coronary artery 10-22-2018   Ostial LAD DES PCI: Resolute Onyx 3.5 mm x 18 mm - 3.6 mm   STEMI (ST elevation myocardial infarction) (HCC) 10/12/2018    Patient Active Problem List   Diagnosis Date Noted   ICD (implantable cardioverter-defibrillator) in place 04/23/2020   Chronic systolic heart failure (HCC) 11/29/2019   Ischemic cardiomyopathy 11/14/2018   Hx of ST elevation myocardial infarction 09/2018 11/14/2018   Coronary artery anomaly, congenital    Unstable angina (HCC)    Precordial chest pain 11/11/2018   Acute combined systolic and diastolic heart failure (HCC) 10/22/18   Coronary artery disease involving native coronary artery of native heart with unstable angina pectoris (HCC) 22-Oct-2018   Presence of drug coated stent in LAD coronary artery October 22, 2018   Hyperlipidemia with target LDL less than 70 10/22/2018   At risk for sudden cardiac death 10/22/18   Acute ST elevation myocardial infarction (STEMI) (HCC) 10/12/2018   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/12/2018   Non-ST elevation (NSTEMI) myocardial infarction (HCC) 10/12/2018   Delayed presentation of acute anterolateral STEMI 10/12/2018    Past Surgical History:  Procedure Laterality Date   CARDIAC CATHETERIZATION     CORONARY/GRAFT ACUTE MI REVASCULARIZATION N/A 10/12/2018   Procedure: CORONARY/GRAFT ACUTE MI REVASCULARIZATION;  Surgeon: Lyn Records, MD;  Location: [facility] INVASIVE CV LAB;  Service: Cardiovascular;  Laterality: N/A;   LEFT HEART CATH AND CORONARY ANGIOGRAPHY N/A 10/12/2018   Procedure: LEFT HEART CATH AND CORONARY ANGIOGRAPHY;  Surgeon: Lyn Records, MD;  Location: [facility] INVASIVE CV LAB;  Service: Cardiovascular;  Laterality: N/A;   RIGHT/LEFT HEART CATH AND CORONARY ANGIOGRAPHY N/A 11/13/2018   Procedure: RIGHT/LEFT HEART CATH AND CORONARY  ANGIOGRAPHY;  Surgeon: Yvonne Kendall, MD;  Location: [facility] INVASIVE CV LAB;  Service: Cardiovascular;  Laterality: N/A;   SUBQ ICD IMPLANT N/A 01/14/2020   Procedure: SUBQ ICD IMPLANT;  Surgeon: Marinus Maw, MD;  Location: [facility] INVASIVE CV LAB;  Service: Cardiovascular;  Laterality: N/A;       Family History  Problem Relation Age of Onset   Thyroid disease Mother    Thyroid disease Brother    Diabetes Brother    Heart disease Neg Hx     Social History   Tobacco Use   Smoking status: Never   Smokeless tobacco: Never  Substance Use Topics   Alcohol use: Yes    Alcohol/week: 2.0 standard drinks    Types: 2 Cans of beer per week    Comment: 2 packs/month    Drug use: Never    Home Medications Prior to Admission medications   Medication Sig Start Date End Date Taking? Authorizing Provider  aspirin 81 MG chewable tablet Chew 1 tablet (81 mg total) by mouth daily. 10/17/18   Arty Baumgartner, NP  carboxymethylcellul-glycerin (LUBRICANT DROPS/DUAL-ACTION) 0.5-0.9 % ophthalmic solution Place 1 drop into both eyes 3 (three) times daily as needed (irritation).    [provider]  clopidogrel (PLAVIX) 75 MG tablet Take 1 tablet (75 mg total) by mouth daily. 07/02/20   Lyn Records, MD  metoprolol succinate (TOPROL-XL) 25 MG 24 hr tablet TAKE 1/2 TABLET BY MOUTH EVERY DAY 08/26/20   Lyn Records, MD  nitroGLYCERIN (NITROSTAT) 0.4 MG SL tablet Place 1 tablet (0.4 mg total) under the tongue every 5 (five) minutes x 3 doses as needed for chest pain. 10/16/18   Arty Baumgartner, NP  REPATHA SURECLICK 140 MG/ML SOAJ INJECT CONTENTS OF 1 PEN INTO THE SKIN EVERY 14 (FOURTEEN) DAYS. 06/02/20   Lyn Records, MD  sacubitril-valsartan (ENTRESTO) 49-51 MG Take 1 tablet by mouth 2 (two) times daily. 01/29/20   Lyn Records, MD    Allergies    [place] Scrape ethyl]  Review of Systems   Review of Systems  Constitutional:  Negative for chills and fever.  HENT:  Negative for  congestion and facial swelling.   Eyes:  Negative for discharge and visual disturbance.  Respiratory:  Negative for shortness of breath.   Cardiovascular:  Negative for chest pain and palpitations.  Gastrointestinal:  Negative for abdominal pain, diarrhea and vomiting.  Musculoskeletal:  Negative for arthralgias and myalgias.  Skin:  Negative for color change and rash.  Neurological:  Positive for numbness (tingling). Negative for tremors, syncope and headaches.  Psychiatric/Behavioral:  Negative for confusion and dysphoric mood.    Physical Exam Updated Vital Signs BP 108/75   Pulse 75   Temp 98.3 F (36.8 C) (Oral)   Resp 16   SpO2 100%   Physical Exam Vitals and nursing note reviewed.  Constitutional:      Appearance: He is well-developed.  HENT:     Head: Normocephalic and atraumatic.      Comments: Area of reported tingling. No rash Eyes:     Pupils: Pupils are equal, round, and reactive to light.  Neck:     Vascular: No JVD.  Cardiovascular:     Rate and Rhythm: Normal rate and regular rhythm.     Heart sounds: No murmur heard.   No friction rub. No gallop.  Pulmonary:     Effort: No respiratory distress.     Breath sounds: No wheezing.  Abdominal:     General: There is no distension.     Tenderness: There is no abdominal tenderness. There is no guarding or rebound.  Musculoskeletal:        General: Normal range of motion.     Cervical back: Normal range of motion and neck supple.  Skin:    Coloration: Skin is not pale.     Findings: No rash.  Neurological:     Mental Status: He is alert and oriented to person, place, and time.     GCS: GCS eye subscore is 4. GCS verbal subscore is 5. GCS motor subscore is 6.     Cranial Nerves: Cranial nerves are intact.     Sensory: Sensation is intact.     Motor: Motor function is intact.     Coordination: Coordination is intact.     Comments: Benign neurologic exam.  Psychiatric:        Behavior: Behavior normal.     ED Results / Procedures / Treatments   Labs (all labs ordered are listed, but only abnormal results are displayed) Labs Reviewed  BASIC METABOLIC PANEL - Abnormal; Notable for the following components:      Result Value   Sodium 133 (*)    All other components within normal limits  CBC  TROPONIN I (HIGH SENSITIVITY)  TROPONIN I (HIGH SENSITIVITY)    EKG EKG Interpretation  Date/Time:  Tuesday November 25 2020 12:22:37 EDT Ventricular Rate:  82 PR Interval:  156 QRS Duration: 86 QT Interval:  326 QTC Calculation: 380 R Axis:   264 Text Interpretation: Normal sinus rhythm Anterolateral infarct , age undetermined Abnormal ECG No significant change since last tracing Confirmed by Melene PlanFloyd, Casey Fye [id number]) on 11/25/2020 6:24:06 PM  Radiology DG Chest 2 View  Result Date: 11/25/2020 CLINICAL DATA:  Chest pain EXAM: CHEST - 2 VIEW COMPARISON:  11/11/2018 FINDINGS: The lungs are clear without focal pneumonia, edema, pneumothorax or pleural effusion. The cardiopericardial silhouette is within normal limits for size. Left-sided pacer/AICD is new in the interval. The visualized bony structures of the thorax show no acute abnormality. IMPRESSION: No active cardiopulmonary disease. Electronically Signed   By: Kennith CenterEric  Mansell M.D.   On: 11/25/2020 13:11    Procedures Procedures   Medications Ordered in ED Medications - No data to display  ED Course  I have reviewed the triage vital signs and the nursing notes.  Pertinent labs & imaging results that were available during my care of the patient were reviewed by me and considered in my medical decision making (see chart for details).    MDM Rules/Calculators/A&P                           40 yo M with a chief complaints of tingling to the left side of his head.  This is in a very small localized area.  Could be very early Bell's palsy though has resolved and has no facial nerve palsy clinically on exam.  No signs of rash.  Left TM is normal.   No signs of intraoral pathology.  Patient has some mild left shoulder pain though  no neurologic deficit with the left upper extremity.  Chest x-ray viewed by me with no concerning finding with his implanted defibrillator.  We will have him follow-up with his cardiologist and orthopedist.  Given neurology follow-up as well.  We will have him return for strokelike symptoms, concern for Bell's palsy.  Facial rash.  6:47 PM:  I have discussed the diagnosis/risks/treatment options with the patient and believe the pt to be eligible for discharge home to follow-up with PCP, neuro, Cards, ortho. We also discussed returning to the ED immediately if new or worsening sx occur. We discussed the sx which are most concerning (e.g., sudden worsening pain, fever, inability to tolerate by mouth, rash, stroke s/sx) that necessitate immediate return. Medications administered to the patient during their visit and any new prescriptions provided to the patient are listed below.  Medications given during this visit Medications - No data to display   The patient appears reasonably screen and/or stabilized for discharge and I doubt any other medical condition or other [facility] requiring further screening, evaluation, or treatment in the ED at this time prior to discharge.   Final Clinical Impression(s) / ED Diagnoses Final diagnoses:  Paresthesia    Rx / DC Orders ED Discharge Orders          Ordered    Ambulatory referral to Neurology       Comments: Paresthesia?   11/25/20 1837             Melene Plan, DO 11/25/20 1847

## 2020-11-25 NOTE — Telephone Encounter (Signed)
Patient cancelled echocardiogram for the reason below:  11/25/2020 1:09 PM [name], James M  Cancel Rsn: Patient (patient says he will reschedule later)   Order will be removed from the Active Echo WQ and if patient calls back to reschedule we will reinstate the order.Thank you.

## 2020-11-25 NOTE — ED Provider Notes (Signed)
Emergency Medicine Provider Triage Evaluation Note  James [name] , a 40 y.o. male  was evaluated in triage.  Pt complains of tingling sensation to left side of his head.  Patient noticed a sensation approximately 0 600 this morning.  Sensation has now resolved.  Patient denies any recent head injuries or traumas.  Patient denies any facial asymmetry, aphasia, dysphagia, facial asymmetry, numbness, weakness, chest pain, shortness of breath.  Patient has [employer] ICD.  Review of Systems  Positive: Tingling sensation Negative: facial asymmetry, aphasia, dysphagia, facial asymmetry, numbness, weakness, chest pain, shortness of breath.  Physical Exam  BP 107/71 (BP Location: Left Arm)   Pulse 82   Temp 98.3 F (36.8 C) (Oral)   Resp 17   SpO2 100%  Gen:   Awake, no distress   Resp:  Normal effort  MSK:   Moves extremities without difficulty  Other:  CN II through XII intact.  Grip strength equal.  +5 strength to bilateral upper and lower extremities.  Negative pronator drift.  Medical Decision Making  Medically screening exam initiated at 12:32 PM.  Appropriate orders placed.  James [name] was informed that the remainder of the evaluation will be completed by another provider, this initial triage assessment does not replace that evaluation, and the importance of remaining in the ED until their evaluation is complete.  The patient appears stable so that the remainder of the work up may be completed by another provider.      Haskel Schroeder, PA-C 11/25/20 1233    Mancel Bale, MD 11/25/20 1753

## 2020-11-26 ENCOUNTER — Encounter: Payer: Self-pay | Admitting: Neurology

## 2020-11-29 ENCOUNTER — Other Ambulatory Visit: Payer: Self-pay | Admitting: Interventional Cardiology

## 2020-12-01 ENCOUNTER — Other Ambulatory Visit: Payer: Self-pay | Admitting: Interventional Cardiology

## 2020-12-02 MED ORDER — ENTRESTO 49-51 MG PO TABS: 1.0000 | ORAL_TABLET | Freq: Two times a day (BID) | ORAL | 2 refills | Status: DC

## 2020-12-24 ENCOUNTER — Encounter: Payer: Self-pay | Admitting: *Deleted

## 2021-02-16 ENCOUNTER — Encounter: Payer: Self-pay | Admitting: Internal Medicine

## 2021-02-16 ENCOUNTER — Encounter: Payer: Self-pay | Admitting: Neurology

## 2021-02-20 ENCOUNTER — Ambulatory Visit: Payer: BC Managed Care – PPO | Admitting: Neurology

## 2021-02-25 ENCOUNTER — Ambulatory Visit: Payer: BC Managed Care – PPO | Admitting: Interventional Cardiology

## 2021-03-03 LAB — CUP PACEART REMOTE DEVICE CHECK
Battery Remaining Percentage: 88 %
Date Time Interrogation Session: 20221213132400
Implantable Lead Implant Date: 20211025
Implantable Lead Location: 753860
Implantable Lead Model: 3501
Implantable Lead Serial Number: 198127
Implantable Pulse Generator Implant Date: 20211025
Pulse Gen Serial Number: 146448

## 2021-03-04 ENCOUNTER — Ambulatory Visit (INDEPENDENT_AMBULATORY_CARE_PROVIDER_SITE_OTHER): Payer: BC Managed Care – PPO

## 2021-03-04 DIAGNOSIS — I255 Ischemic cardiomyopathy: Secondary | ICD-10-CM

## 2021-03-06 ENCOUNTER — Encounter: Payer: Self-pay | Admitting: Interventional Cardiology

## 2021-03-09 MED ORDER — REPATHA SURECLICK 140 MG/ML ~~LOC~~ SOAJ: SUBCUTANEOUS | 11 refills | Status: DC

## 2021-03-13 NOTE — Progress Notes (Signed)
Remote ICD transmission.   

## 2021-03-23 NOTE — Progress Notes (Signed)
no

## 2021-03-25 ENCOUNTER — Encounter: Payer: Self-pay | Admitting: Interventional Cardiology

## 2021-03-25 ENCOUNTER — Other Ambulatory Visit: Payer: Self-pay

## 2021-03-25 ENCOUNTER — Ambulatory Visit (INDEPENDENT_AMBULATORY_CARE_PROVIDER_SITE_OTHER): Payer: BC Managed Care – PPO | Admitting: Interventional Cardiology

## 2021-03-25 VITALS — BP 98/62 | HR 81 | Ht 65.0 in | Wt 130.2 lb

## 2021-03-25 DIAGNOSIS — E785 Hyperlipidemia, unspecified: Secondary | ICD-10-CM

## 2021-03-25 DIAGNOSIS — I5022 Chronic systolic (congestive) heart failure: Secondary | ICD-10-CM | POA: Diagnosis not present

## 2021-03-25 DIAGNOSIS — Z95810 Presence of automatic (implantable) cardiac defibrillator: Secondary | ICD-10-CM | POA: Diagnosis not present

## 2021-03-25 DIAGNOSIS — I10 Essential (primary) hypertension: Secondary | ICD-10-CM | POA: Diagnosis not present

## 2021-03-25 DIAGNOSIS — I2510 Atherosclerotic heart disease of native coronary artery without angina pectoris: Secondary | ICD-10-CM | POA: Diagnosis not present

## 2021-03-25 MED ORDER — NITROGLYCERIN 0.4 MG SL SUBL: 0.4000 mg | SUBLINGUAL_TABLET | SUBLINGUAL | 2 refills | Status: AC | PRN

## 2021-03-25 NOTE — Patient Instructions (Signed)
Medication Instructions:  Your physician recommends that you continue on your current medications as directed. Please refer to the Current Medication list given to you today.  *If you need a refill on your cardiac medications before your next appointment, please call your pharmacy*   Lab Work: None If you have labs (blood work) drawn today and your tests are completely normal, you will receive your results only by: MyChart Message (if you have MyChart) OR A paper copy in the mail If you have any lab test that is abnormal or we need to change your treatment, we will call you to review the results.   Testing/Procedures: None   Follow-Up: At [facility], you and your health needs are our priority.  As part of our continuing mission to provide you with exceptional heart care, we have created designated Provider Care Teams.  These Care Teams include your primary Cardiologist (physician) and Advanced Practice Providers (APPs -  Physician Assistants and Nurse Practitioners) who all work together to provide you with the care you need, when you need it.  We recommend signing up for the patient portal called "MyChart".  Sign up information is provided on this After Visit Summary.  MyChart is used to connect with patients for Virtual Visits (Telemedicine).  Patients are able to view lab/test results, encounter notes, upcoming appointments, etc.  Non-urgent messages can be sent to your provider as well.   To learn more about what you can do with MyChart, go to [email or web address].    Your next appointment:   9-10 month(s)  The format for your next appointment:   In Person  Provider:   Lesleigh Noe, MD     Other Instructions

## 2021-03-25 NOTE — Progress Notes (Signed)
Cardiology Office Note:    Date:  03/25/2021   ID:  James [name], DOB [birth year], MRN [id number]  PCP:  Pcp, No  Cardiologist:  Lesleigh Noe, MD   Referring MD: No ref. provider found   Chief Complaint  Patient presents with   Coronary Artery Disease   Congestive Heart Failure    History of Present Illness:    James [name] is a 41 y.o. 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 Seen by EP Dr. Ladona Ridgel and considering S-ICD for primary prevention.   He denies angina, syncope, orthopnea, edema, and PND.  Physically active.  If he goes to heart physically, he will get short of breath.  He has not had significant palpitations and denies AICD discharge.  Was not able to tolerate statin therapy.  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    Coronary artery disease involving native coronary artery of native heart with unstable angina pectoris (HCC) 11/06/2018   Delayed presentation of acute anterolateral STEMI 10/12/2018   Hyperlipidemia    possible elevated triglycerides   Ischemic cardiomyopathy    EF 30-35% // Echo 11/2018: Anteroseptal and anterior hypokinesis, EF 30-35, small circumferential pericardial effusion, no intracardiac thrombi    Presence of drug coated stent in LAD coronary artery Nov 06, 2018   Ostial LAD DES PCI: Resolute Onyx 3.5 mm x 18 mm - 3.6 mm   STEMI (ST elevation myocardial infarction) (HCC) 10/12/2018    Past Surgical History:  Procedure  Laterality Date   CARDIAC CATHETERIZATION     CORONARY/GRAFT ACUTE MI REVASCULARIZATION N/A 10/12/2018   Procedure: CORONARY/GRAFT ACUTE MI REVASCULARIZATION;  Surgeon: Lyn Records, MD;  Location: [facility] INV[facility] LAB;  Service: Cardiovascular;  Laterality: N/A;   LEFT HEART CATH AND CORONARY ANGIOGRAPHY N/A 10/12/2018   Procedure: LEFT HEART CATH AND CORONARY ANGIOGRAPHY;  Surgeon: Lyn Records, MD;  Location: [facility] INVASIVE CV LAB;  Service: Cardiovascular;  Laterality: N/A;   RIGHT/LEFT HEART CATH AND CORONARY ANGIOGRAPHY N/A 11/13/2018   Procedure: RIGHT/LEFT HEART CATH AND CORONARY ANGIOGRAPHY;  Surgeon: Yvonne Kendall, MD;  Location: [facility] INVASIVE CV LAB;  Service: Cardiovascular;  Laterality: N/A;   SUBQ ICD IMPLANT N/A 01/14/2020   Procedure: SUBQ ICD IMPLANT;  Surgeon: Marinus Maw, MD;  Location: [facility] INVASIVE CV LAB;  Service: Cardiovascular;  Laterality: N/A;    Current Medications: Current Meds  Medication Sig   aspirin 81 MG chewable tablet Chew 1 tablet (81 mg total) by mouth daily.   clopidogrel (PLAVIX) 75 MG tablet Take 1 tablet (75 mg total) by mouth daily.   Evolocumab (REPATHA SURECLICK) 140 MG/ML SOAJ INJECT CONTENTS OF 1 PEN INTO THE SKIN EVERY 14 (FOURTEEN) DAYS.   metoprolol succinate (TOPROL-XL) 25 MG 24 hr tablet TAKE 1/2 TABLET BY MOUTH EVERY DAY   sacubitril-valsartan (ENTRESTO) 49-51 MG Take 1 tablet by mouth 2 (two) times daily.   [DISCONTINUED] nitroGLYCERIN (NITROSTAT) 0.4 MG SL tablet Place 1 tablet (0.4 mg total) under the tongue every 5 (five) minutes  x 3 doses as needed for chest pain.     Allergies:   Vascepa [icosapent ethyl]   Social History   Socioeconomic History   Marital status: Married    Spouse name: Not on file   Number of children: Not on file   Years of education: 12   Highest education level: Not on file  Occupational History   Occupation: IT    Employer: [employer]   Tobacco Use   Smoking status: Never   Smokeless tobacco: Never   Substance and Sexual Activity   Alcohol use: Yes    Alcohol/week: 2.0 standard drinks    Types: 2 Cans of beer per week    Comment: 2 packs/month    Drug use: Never   Sexual activity: Not on file  Other Topics Concern   Not on file  Social History Narrative   Patient lives with wife in [place] and small child   Works in Consulting civil engineer for [employer]   Social Determinants of Corporate investment banker Strain: Not on file  Food Insecurity: Not on file  Transportation Needs: Not on file  Physical Activity: Not on file  Stress: Not on file  Social Connections: Not on file     Family History: The patient's family history includes Diabetes in his brother; Thyroid disease in his brother and mother. There is no history of Heart disease.  ROS:   Please see the history of present illness.    A buzzing feeling in his left head that can be migratory to different locations.  Will be seeing neurology.  All other systems reviewed and are negative.  EKGs/Labs/Other Studies Reviewed:    The following studies were reviewed today: No recent cardiac imaging.  He canceled the last echo that was ordered.  EKG:  EKG not performed on today's visit  Recent Labs: 07/11/2020: ALT 20 11/25/2020: BUN 12; Creatinine, Ser 1.00; Hemoglobin 14.8; Platelets 224; Potassium 4.3; Sodium 133  Recent Lipid Panel    Component Value Date/Time   CHOL 108 07/11/2020 0912   TRIG 205 (H) 07/11/2020 0912   HDL 40 07/11/2020 0912   CHOLHDL 2.7 07/11/2020 0912   CHOLHDL 4.6 10/13/2018 0317   VLDL 35 10/13/2018 0317   LDLCALC 35 07/11/2020 0912   LDLDIRECT 35 08/21/2019 0844    Physical Exam:    VS:  BP 98/62    Pulse 81    Ht 5\' 5"  (1.651 m)    Wt 130 lb 3.2 oz (59.1 kg)    SpO2 98%    BMI 21.67 kg/m     Wt Readings from Last 3 Encounters:  03/25/21 130 lb 3.2 oz (59.1 kg)  07/02/20 127 lb 12.8 oz (58 kg)  04/23/20 129 lb 3.2 oz (58.6 kg)     GEN: Appears healthy. No acute distress HEENT: Normal NECK: No  JVD. LYMPHATICS: No lymphadenopathy CARDIAC: No murmur. RRR positive S4 gallop, no edema. VASCULAR:  Normal Pulses. No bruits. RESPIRATORY:  Clear to auscultation without rales, wheezing or rhonchi  ABDOMEN: Soft, non-tender, non-distended, No pulsatile mass, MUSCULOSKELETAL: No deformity  SKIN: Warm and dry NEUROLOGIC:  Alert and oriented x 3 PSYCHIATRIC:  Normal affect   ASSESSMENT:    1. Chronic systolic heart failure (HCC)   2. ICD (implantable cardioverter-defibrillator) in place   3. Coronary artery disease involving native coronary artery of native heart without angina pectoris   4. Essential hypertension   5. Hyperlipidemia with target LDL less than 70    PLAN:  In order of problems listed above:  Continue Entresto and Toprol-XL Continue to follow in device clinic Continue preventive therapy with aspirin, Plavix, and Repatha. Does not have hypertension on current regimen which includes sacubitril and Toprol-XL. Continue Repatha current dose.  6 to 8646-month follow-up.  Echocardiogram should be done at some point this year.   Medication Adjustments/Labs and Tests Ordered: Current medicines are reviewed at length with the patient today.  Concerns regarding medicines are outlined above.  No orders of the defined types were placed in this encounter.  Meds ordered this encounter  Medications   nitroGLYCERIN (NITROSTAT) 0.4 MG SL tablet    Sig: Place 1 tablet (0.4 mg total) under the tongue every 5 (five) minutes x 3 doses as needed for chest pain.    Dispense:  25 tablet    Refill:  2    Patient Instructions  Medication Instructions:  Your physician recommends that you continue on your current medications as directed. Please refer to the Current Medication list given to you today.  *If you need a refill on your cardiac medications before your next appointment, please call your pharmacy*   Lab Work: None If you have labs (blood work) drawn today and your tests  are completely normal, you will receive your results only by: MyChart Message (if you have MyChart) OR A paper copy in the mail If you have any lab test that is abnormal or we need to change your treatment, we will call you to review the results.   Testing/Procedures: None   Follow-Up: At [facility], you and your health needs are our priority.  As part of our continuing mission to provide you with exceptional heart care, we have created designated Provider Care Teams.  These Care Teams include your primary Cardiologist (physician) and Advanced Practice Providers (APPs -  Physician Assistants and Nurse Practitioners) who all work together to provide you with the care you need, when you need it.  We recommend signing up for the patient portal called "MyChart".  Sign up information is provided on this After Visit Summary.  MyChart is used to connect with patients for Virtual Visits (Telemedicine).  Patients are able to view lab/test results, encounter notes, upcoming appointments, etc.  Non-urgent messages can be sent to your provider as well.   To learn more about what you can do with MyChart, go to [email or web address].    Your next appointment:   9-10 month(s)  The format for your next appointment:   In Person  Provider:   Lesleigh NoeHenry W Samuel Mcpeek III, MD     Other Instructions     Signed, Lesleigh NoeHenry W Ammanda Dobbins III, MD  03/25/2021 4:04 PM    [facility]

## 2021-04-21 ENCOUNTER — Telehealth: Payer: Self-pay | Admitting: Interventional Cardiology

## 2021-04-21 ENCOUNTER — Encounter: Payer: Self-pay | Admitting: *Deleted

## 2021-04-21 NOTE — Telephone Encounter (Signed)
Pt has had intermittent CP for about 5 days now.  Thought it might be GERD or muscle pain.  Changed diet with no improvement.  Pain usually comes on with movement and resolves with rest.  Not currently having pain but had it this morning when walking around.  Pt states he did do some boxing Sunday through Tuesday which is new for him.  Not hitting a boxing bag or anything, just air punches.  Cannot produce pain when pushing on chest muscles.  BP was 110/58 today.  States SBP is usually lower than that for him.  Has not felt the need to take Nitro.  Pt very concerned as he is suppose to travel soon and didn't want to go out of town without being examined.  Scheduled pt to see Laurann Montana, PA-C tomorrow at [facility].  Pt asked that I send a MyChart message with address.

## 2021-04-21 NOTE — Telephone Encounter (Signed)
°  Per MyChart scheduling message:  I am getting pain in my mid Chest bone on and Off. It is coming from past 5 days. I was suspecting muscle/Acidity pain but it is still lasting hence want to visit and get tested. Please let me know next action.

## 2021-04-22 ENCOUNTER — Ambulatory Visit (INDEPENDENT_AMBULATORY_CARE_PROVIDER_SITE_OTHER): Payer: BC Managed Care – PPO | Admitting: Family

## 2021-04-22 ENCOUNTER — Other Ambulatory Visit: Payer: Self-pay

## 2021-04-22 VITALS — BP 132/74 | HR 77 | Ht 65.0 in | Wt 127.0 lb

## 2021-04-22 DIAGNOSIS — K219 Gastro-esophageal reflux disease without esophagitis: Secondary | ICD-10-CM

## 2021-04-22 DIAGNOSIS — I5020 Unspecified systolic (congestive) heart failure: Secondary | ICD-10-CM | POA: Diagnosis not present

## 2021-04-22 DIAGNOSIS — E785 Hyperlipidemia, unspecified: Secondary | ICD-10-CM | POA: Diagnosis not present

## 2021-04-22 DIAGNOSIS — Z95810 Presence of automatic (implantable) cardiac defibrillator: Secondary | ICD-10-CM

## 2021-04-22 DIAGNOSIS — I25118 Atherosclerotic heart disease of native coronary artery with other forms of angina pectoris: Secondary | ICD-10-CM

## 2021-04-22 NOTE — Progress Notes (Signed)
Office Visit    Patient Name: James [name] Date of Encounter: 04/22/2021  PCP:  Aviva KluverPcp, No   [facility]  Cardiologist:  Lesleigh NoeHenry W Smith III, MD  Advanced Practice Provider:  No care team member to display Electrophysiologist:  None      Chief Complaint    James [name] is a 41 y.o. 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    Coronary artery disease involving native coronary artery of native heart with unstable angina pectoris (HCC) 10/13/2018   Delayed presentation of acute anterolateral STEMI 10/12/2018   Hyperlipidemia    possible elevated triglycerides   Ischemic cardiomyopathy    EF 30-35% // Echo 11/2018: Anteroseptal and anterior hypokinesis, EF 30-35, small circumferential pericardial effusion, no intracardiac thrombi    Presence of drug coated stent in LAD coronary artery 10/13/2018   Ostial LAD DES PCI: Resolute Onyx 3.5 mm x 18 mm - 3.6 mm   STEMI (ST elevation myocardial infarction) (HCC) 10/12/2018   Past Surgical History:  Procedure Laterality Date   CARDIAC CATHETERIZATION     CORONARY/GRAFT ACUTE MI REVASCULARIZATION N/A 10/12/2018   Procedure: CORONARY/GRAFT ACUTE MI REVASCULARIZATION;  Surgeon: Lyn RecordsSmith, Henry W, MD;  Location: [facility] INVASIVE CV LAB;  Service: Cardiovascular;  Laterality: N/A;   LEFT HEART CATH AND CORONARY ANGIOGRAPHY N/A 10/12/2018   Procedure:  LEFT HEART CATH AND CORONARY ANGIOGRAPHY;  Surgeon: Lyn RecordsSmith, Henry W, MD;  Location: [facility] INVASIVE CV LAB;  Service: Cardiovascular;  Laterality: N/A;   RIGHT/LEFT HEART CATH AND CORONARY ANGIOGRAPHY N/A 11/13/2018   Procedure: RIGHT/LEFT HEART CATH AND CORONARY ANGIOGRAPHY;  Surgeon: Yvonne KendallEnd, Christopher, MD;  Location: [facility] INVASIVE CV LAB;  Service: Cardiovascular;  Laterality: N/A;   SUBQ ICD IMPLANT N/A 01/14/2020   Procedure: SUBQ ICD IMPLANT;  Surgeon: Marinus Mawaylor, Gregg W, MD;  [facility];  Service: Cardiovascular;  Laterality: N/A;    Allergies  Allergies  Allergen Reactions   Vascepa [Icosapent Ethyl]     Heart racing    History of Present Illness    James [name] is a 41 y.o. male with a hx of CAD with late presenting anterior MI 09/2018 treated with DES, ischemic cardiomyopathy, chronic systolic heart failure, congenital RCA to be a fistula, LM to be a fistula, hyperlipidemia, ICD last seen 03/25/21 by Dr. Katrinka BlazingSmith.  He had late presenting MI 09/2018 treated with DES to a total occlusion of the LAD. Severe LVEF 30-35% at that time. Cardiac MRI 01/30/2019 with LVEF 34%, wall motion abnormalities in LAD infarction pattern, RV normal size and function, dense LAD territory scar.  Echo 10/2019 LVEF 30-35%, wall motion abnormalities, normal diastolic parameters, RV normal size and function, no significant valvular abnormalities. He had ICD implanted 01/14/2020 by Dr. Ladona Ridgelaylor.  He has been previously intolerant to statins and maintained on Repatha. He was last seen 03/25/2021.  He was doing well from a cardiac perspective and  no changes were made.  Presents today for follow up with his wife after contacting the office yesterday noting chest pain. Shares with me that last week he started some air boxing as well as light weight lifting to increase physical activity. He subsequently had chest pain with movement of upper extremities. He also noted gas pain and indigestion for which he ate a  bland diet for a few days. He notes a "very hard pain" in his midsternal region that did not radiate. He continued walking and cycling without exertional dyspnea nor chest pain. His wife is concerned he is over-restricting his diet after cardiac diagnosis. Notes palpitations or indigestion after many foods and has not seen GI previously. Shares with me that he is moving to [place] in March for work.   EKGs/Labs/Other Studies Reviewed:   The following studies were reviewed today:  EKG:  EKG is ordered today.  The ekg ordered today demonstrates normal sinus rhythm 77 bpm with no acute ST/T wave changes.  Recent Labs: 07/11/2020: ALT 20 11/25/2020: BUN 12; Creatinine, Ser 1.00; Hemoglobin 14.8; Platelets 224; Potassium 4.3; Sodium 133  Recent Lipid Panel    Component Value Date/Time   CHOL 108 07/11/2020 0912   TRIG 205 (H) 07/11/2020 0912   HDL 40 07/11/2020 0912   CHOLHDL 2.7 07/11/2020 0912   CHOLHDL 4.6 10/13/2018 0317   VLDL 35 10/13/2018 0317   LDLCALC 35 07/11/2020 0912   LDLDIRECT 35 08/21/2019 0844    Home Medications   Current Meds  Medication Sig   aspirin 81 MG chewable tablet Chew 1 tablet (81 mg total) by mouth daily.   carboxymethylcellul-glycerin (LUBRICANT DROPS/DUAL-ACTION) 0.5-0.9 % ophthalmic solution Place 1 drop into both eyes 3 (three) times daily as needed (irritation).   clopidogrel (PLAVIX) 75 MG tablet Take 1 tablet (75 mg total) by mouth daily.   Evolocumab (REPATHA SURECLICK) 140 MG/ML SOAJ INJECT CONTENTS OF 1 PEN INTO THE SKIN EVERY 14 (FOURTEEN) DAYS.   metoprolol succinate (TOPROL-XL) 25 MG 24 hr tablet TAKE 1/2 TABLET BY MOUTH EVERY DAY   nitroGLYCERIN (NITROSTAT) 0.4 MG SL tablet Place 1 tablet (0.4 mg total) under the tongue every 5 (five) minutes x 3 doses as needed for chest pain.   sacubitril-valsartan (ENTRESTO) 49-51 MG Take 1 tablet by mouth 2 (two) times daily.     Review of Systems      All other systems reviewed and are otherwise  negative except as noted above.  Physical Exam    VS:  Pulse 77    Ht 5\' 5"  (1.651 m)    Wt 127 lb (57.6 kg)    BMI 21.13 kg/m  , BMI Body mass index is 21.13 kg/m.  Wt Readings from Last 3 Encounters:  04/22/21 127 lb (57.6 kg)  03/25/21 130 lb 3.2 oz (59.1 kg)  07/02/20 127 lb 12.8 oz (58 kg)    GEN: Well nourished, well developed, in no acute distress. HEENT: normal. Neck: Supple, no JVD, carotid bruits, or masses. Cardiac: RRR, no murmurs, rubs, or gallops. No clubbing, cyanosis, edema.  Radials/PT 2+ and equal bilaterally.  Respiratory:  Respirations regular and unlabored, clear to auscultation bilaterally. GI: Soft, nontender, nondistended. MS: No deformity or atrophy. Skin: Warm and dry, no rash. Neuro:  Strength and sensation are intact. Psych: Normal affect.  Assessment & Plan    CAD / HFrEF / ICM - Late presenting MI 2020 with DES to LAD. Echo 10/2019 LVEF 30-35%. Euvolemic and well compensated on exam. NYHA II.  ICD in place. EKG today no acute ST/T wave changes. Episodes of chest pain atypical for angina and most likely musculoskeletal and GERD in etiology as relieved by belching and notable with movement of upper extremitites. Start Pepcid PRN for indigestion, not interested in daily PPI. Update echo to reassess LVEF as he is moving in March to [place]. If EF still reduced, plan to add Farxiga 10mg  QD. Present GDMT includes Entresto, Toprol, DAPT Aspirin/Plavix. Heart healthy diet and regular cardiovascular exercise encouraged.    HLD, LDl goal <70 - Statin intolerant. Continue Repatha.   S/p ICD - Continue to follow with EP. No device shock.   Disposition: To establish with cardiology in as moving in March. Requests to see Dr. April prior to leaving, will route note to Dr. Katrinka Blazing and his RN to see if this can be facilitated.   Signed, Katrinka Blazing, NP 04/22/2021, 10:45 AM  [facility]

## 2021-04-22 NOTE — Patient Instructions (Addendum)
Medication Instructions:  Continue your current medications.   You may use Famotidine (Pepcid) as needed twice per day for your indigestion.  You may also use Tums as needed for indigestion in between your Famotidine (Pepcid).  *If you need a refill on your cardiac medications before your next appointment, please call your pharmacy*   Lab Work: None ordered today.  Testing/Procedures: Your EKG today showed normal sinus rhythm which is a good result!  Your physician has requested that you have an echocardiogram. Echocardiography is a painless test that uses sound waves to create images of your heart. It provides your doctor with information about the size and shape of your heart and how well your hearts chambers and valves are working. This procedure takes approximately one hour. There are no restrictions for this procedure.   Follow-Up: At [facility], you and your health needs are our priority.  As part of our continuing mission to provide you with exceptional heart care, we have created designated Provider Care Teams.  These Care Teams include your primary Cardiologist (physician) and Advanced Practice Providers (APPs -  Physician Assistants and Nurse Practitioners) who all work together to provide you with the care you need, when you need it.  We recommend signing up for the patient portal called "MyChart".  Sign up information is provided on this After Visit Summary.  MyChart is used to connect with patients for Virtual Visits (Telemedicine).  Patients are able to view lab/test results, encounter notes, upcoming appointments, etc.  Non-urgent messages can be sent to your provider as well.   To learn more about what you can do with MyChart, go to [email or web address].    Your next appointment:   Alver Sorrow, NP will reach out to Dr. Katrinka Blazing about getting you an appointment prior to your moving in March.    Other Instructions Recommend following  allow salt diet and sticking  to less than 2 grams of sodium intake  Heart Healthy Diet Recommendations: A low-salt diet is recommended. Meats should be grilled, baked, or boiled. Avoid fried foods. Focus on lean protein sources like fish or chicken with vegetables and fruits. The American Heart Association is a Chief Technology Officer!  American Heart Association Diet and Lifeystyle Recommendations    Pursed Lip Breathing Being short of breath can make you tense and anxious. Before you start this breathing exercise, take a minute to relax your shoulders and close your eyes. Then: Start the exercise by closing your mouth. Breathe in through your nose, taking a normal breath. You can do this at your normal rate of breathing. If you feel you are not getting enough air, breathe in while slowly counting to 2 or 3. Pucker (purse) your lips as if you were going to whistle. Gently tighten the muscles of your abdomenor press on your abdomen to help push the air out. Breathe out slowly through your pursed lips. Take at least twice as long to breathe out as it takes you to breathe in. Make sure that you breathe out all of the air, but do not force air out. Ask your health care provider how often and how long to do this exercise.

## 2021-04-23 ENCOUNTER — Encounter (HOSPITAL_BASED_OUTPATIENT_CLINIC_OR_DEPARTMENT_OTHER): Payer: Self-pay | Admitting: Family

## 2021-05-11 NOTE — Progress Notes (Signed)
NEUROLOGY CONSULTATION NOTE  James [name] MRN: [id number] DOB: Jun 27, 1980  Referring provider: [name], DO (ED referral) Primary care provider: No PCP  Reason for consult:  paresthesias  Assessment/Plan:   Transient head neuralgia, bilateral - no recurrence.  Unclear etiology.  Not consistent with stroke.  No recurrence and normal neurologic exam.  Therefore, I do not think brain imaging is warranted.  Follow up as needed.   Subjective:  James [name] is a 41 year old male with ischemic cardiomyopathy/CHF, CAD s/p STEMI and angina who presents for paresthesias.  History supplemented by ED note.  History of CAD with MI in 2020 treated with DES.  He has ischemic cardiomyopathy and has an ICD.  On 11/25/2020, he experienced tingling traveling up and down his posterior head just above his left ear.  It then spread to involve the right side as well.  Symptoms lasted 5 hours.   No associated headache, neck pain, unilateral extremity numbness or weakness, slurred speech, facial droop.  Due to concern for stroke, he went to the ED.  Stroke was not suspected but query a Bell's palsy although he did not have facial weakness.  No recurrence.  He reports some difficulty sleeping.  Feels anxious and thinks a lot.  No history of migraine.     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    Coronary artery disease involving native coronary artery of native heart with unstable angina pectoris ([name]) 11/08/18   Delayed presentation of acute anterolateral STEMI 10/12/2018   Hyperlipidemia    possible elevated triglycerides   Ischemic cardiomyopathy    EF  30-35% // Echo 11/2018: Anteroseptal and anterior hypokinesis, EF 30-35, small circumferential pericardial effusion, no intracardiac thrombi    Presence of drug coated stent in LAD coronary artery 08-Nov-2018   Ostial LAD DES PCI: Resolute Onyx 3.5 mm x 18 mm - 3.6 mm   STEMI (ST elevation myocardial infarction) ([place]) 10/12/2018    PAST SURGICAL HISTORY: Past Surgical History:  Procedure Laterality Date   CARDIAC CATHETERIZATION     CORONARY/GRAFT ACUTE MI REVASCULARIZATION N/A 10/12/2018   Procedure: CORONARY/GRAFT ACUTE MI REVASCULARIZATION;  Surgeon: [name], MD;  Location: [place] [facility];  Service: Cardiovascular;  Laterality: N/A;   LEFT HEART CATH AND CORONARY ANGIOGRAPHY N/A 10/12/2018   Procedure: LEFT HEART CATH AND CORONARY ANGIOGRAPHY;  Surgeon: [name], MD;  Location: [place] [facility];  Service: Cardiovascular;  Laterality: N/A;   RIGHT/LEFT HEART CATH AND CORONARY ANGIOGRAPHY N/A 11/13/2018   Procedure: RIGHT/LEFT HEART CATH AND CORONARY ANGIOGRAPHY;  Surgeon: [name], MD;  Location: [place] [facility];  Service: Cardiovascular;  Laterality: N/A;   SUBQ ICD IMPLANT N/A 01/14/2020   Procedure: SUBQ ICD IMPLANT;  Surgeon: [name], MD;  Location: [place] [facility];  Service: Cardiovascular;  Laterality: N/A;    MEDICATIONS: Current Outpatient Medications on File Prior to Visit  Medication Sig Dispense Refill   aspirin 81 MG chewable tablet Chew 1 tablet (81 mg total) by mouth daily. 90 tablet 1   carboxymethylcellul-glycerin (LUBRICANT DROPS/DUAL-ACTION) 0.5-0.9 % ophthalmic solution Place 1 drop into both eyes 3 (three) times daily as  needed (irritation).     clopidogrel (PLAVIX) 75 MG tablet Take 1 tablet (75 mg total) by mouth daily. 90 tablet 3   Evolocumab (REPATHA SURECLICK) XX123456 MG/ML SOAJ INJECT CONTENTS OF 1 PEN INTO THE SKIN EVERY 14 (FOURTEEN) DAYS. 2 mL 11   metoprolol succinate (TOPROL-XL) 25 MG 24 hr tablet TAKE 1/2 TABLET BY MOUTH  EVERY DAY 45 tablet 3   nitroGLYCERIN (NITROSTAT) 0.4 MG SL tablet Place 1 tablet (0.4 mg total) under the tongue every 5 (five) minutes x 3 doses as needed for chest pain. 25 tablet 2   sacubitril-valsartan (ENTRESTO) 49-51 MG Take 1 tablet by mouth 2 (two) times daily. 180 tablet 2   No current facility-administered medications on file prior to visit.    ALLERGIES: Allergies  Allergen Reactions   Vascepa [Icosapent Ethyl]     Heart racing    FAMILY HISTORY: Family History  Problem Relation Age of Onset   Thyroid disease Mother    Thyroid disease Brother    Diabetes Brother    Heart disease Neg Hx     Objective:  Blood pressure 117/74, pulse 81, height 5\' 5"  (1.651 m), weight 129 lb 12.8 oz (58.9 kg), SpO2 100 %. General: No acute distress.  Patient appears well-groomed.   Head:  Normocephalic/atraumatic Eyes:  fundi examined but not visualized Neck: supple, no paraspinal tenderness, full range of motion Back: No paraspinal tenderness Heart: regular rate and rhythm Lungs: Clear to auscultation bilaterally. Vascular: No carotid bruits. Neurological Exam: Mental status: alert and oriented to person, place, and time, recent and remote memory intact, fund of knowledge intact, attention and concentration intact, speech fluent and not dysarthric, language intact. Cranial nerves: CN I: not tested CN II: pupils equal, round and reactive to light, visual fields intact CN III, IV, VI:  full range of motion, no nystagmus, no ptosis CN V: facial sensation intact. CN VII: upper and lower face symmetric CN VIII: hearing intact CN IX, X: gag intact, uvula midline CN XI: sternocleidomastoid and trapezius muscles intact CN XII: tongue midline Bulk & Tone: normal, no fasciculations. Motor:  muscle strength 5/5 throughout Sensation:  Pinprick, temperature and vibratory sensation intact. Deep Tendon Reflexes:  2+ throughout,  toes downgoing.   Finger to nose testing:  Without dysmetria.    Heel to shin:  Without dysmetria.   Gait:  Normal station and stride.  Romberg negative.    Thank you for allowing me to take part in the care of this patient.  [name], DO

## 2021-05-12 ENCOUNTER — Other Ambulatory Visit: Payer: Self-pay

## 2021-05-12 ENCOUNTER — Ambulatory Visit (INDEPENDENT_AMBULATORY_CARE_PROVIDER_SITE_OTHER): Payer: BC Managed Care – PPO | Admitting: Neurology

## 2021-05-12 ENCOUNTER — Encounter: Payer: Self-pay | Admitting: Neurology

## 2021-05-12 VITALS — BP 117/74 | HR 81 | Ht 65.0 in | Wt 129.8 lb

## 2021-05-12 DIAGNOSIS — R202 Paresthesia of skin: Secondary | ICD-10-CM | POA: Diagnosis not present

## 2021-05-12 NOTE — Patient Instructions (Signed)
I don't think you had a stroke or tumor.  I would consider talking with your PCP about treating anxiety.  I would ask your cardiologist about taking melatonin at bedtime

## 2021-05-17 NOTE — Progress Notes (Signed)
Cardiology Office Note:    Date:  05/18/2021   ID:  James [name], DOB [birth year], MRN [id number]  PCP:  James [name], No  Cardiologist:  James Noe, MD   Referring MD: No ref. provider found   Chief Complaint  Patient presents with   Coronary Artery Disease   Congestive Heart Failure   Hypertension    History of Present Illness:    James [name] is a 41 y.o. 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 Seen by EP James [name] and considering S-ICD for primary prevention.  Recent non-cardiac chest pain in early February 2023.Echocardiogram recommended but not yet done.  She had noncardiac chest pain after doing upper body workout and late January and saw James [name].  The discomfort is completely resolved.  He is not having chest discomfort.  He denies dyspnea, orthopnea, PND, edema, palpitations, and syncope.  A plethora of questions were asked by the patient and his wife.  Many have to do with relocation to [place] for the next 12 months and subsequently potentially to [place] as he was not able to renew his green card.  They have no family in [place] and will be starting a new job.  He is physically active.  We discussed secondary prevention.  Also discussed his congenital left main pulmonary artery fistula.  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    Coronary artery disease involving native coronary artery of native heart with unstable angina pectoris ([facility])  11/04/2018   Delayed presentation of acute anterolateral STEMI 10/12/2018   Hyperlipidemia    possible elevated triglycerides   Ischemic cardiomyopathy    EF 30-35% // Echo 11/2018: Anteroseptal and anterior hypokinesis, EF 30-35, small circumferential pericardial effusion, no intracardiac thrombi    Presence of drug coated stent in LAD coronary artery 11-04-18   Ostial LAD DES PCI: Resolute Onyx 3.5 mm x 18 mm - 3.6 mm   STEMI (ST elevation myocardial infarction) ([facility]) 10/12/2018    Past Surgical History:  Procedure Laterality Date   CARDIAC CATHETERIZATION     CORONARY/GRAFT ACUTE MI REVASCULARIZATION N/A 10/12/2018   Procedure: CORONARY/GRAFT ACUTE MI REVASCULARIZATION;  Surgeon: James Records, MD;  [facility];  Service: Cardiovascular;  Laterality: N/A;   LEFT HEART CATH AND CORONARY ANGIOGRAPHY N/A 10/12/2018   Procedure: LEFT HEART CATH AND CORONARY ANGIOGRAPHY;  Surgeon: James Records, MD;  Location: [facility];  Service: Cardiovascular;  Laterality: N/A;   RIGHT/LEFT HEART CATH AND CORONARY ANGIOGRAPHY N/A 11/13/2018   Procedure: RIGHT/LEFT HEART CATH AND CORONARY ANGIOGRAPHY;  Surgeon: James Kendall, MD;  Location: [facility];  Service: Cardiovascular;  Laterality: N/A;   SUBQ ICD IMPLANT N/A 01/14/2020   Procedure: SUBQ ICD IMPLANT;  Surgeon: James Maw, MD;  Location: [facility];  Service: Cardiovascular;  Laterality: N/A;    Current Medications: Current Meds  Medication Sig   aspirin 81 MG chewable tablet Chew 1 tablet (81 mg total) by mouth daily.  carboxymethylcellul-glycerin (LUBRICANT DROPS/DUAL-ACTION) 0.5-0.9 % ophthalmic solution Place 1 drop into both eyes 3 (three) times daily as needed (irritation).   clopidogrel (PLAVIX) 75 MG tablet Take 1 tablet (75 mg total) by mouth daily.   Evolocumab (REPATHA SURECLICK) 140 MG/ML SOAJ INJECT CONTENTS OF 1 PEN INTO THE SKIN EVERY 14 (FOURTEEN) DAYS.   metoprolol succinate  (TOPROL-XL) 25 MG 24 hr tablet TAKE 1/2 TABLET BY MOUTH EVERY DAY   nitroGLYCERIN (NITROSTAT) 0.4 MG SL tablet Place 1 tablet (0.4 mg total) under the tongue every 5 (five) minutes x 3 doses as needed for chest pain.   sacubitril-valsartan (ENTRESTO) 49-51 MG Take 1 tablet by mouth 2 (two) times daily.     Allergies:   Vascepa [icosapent ethyl]   Social History   Socioeconomic History   Marital status: Married    Spouse name: Not on file   Number of children: Not on file   Years of education: 12   Highest education level: Not on file  Occupational History   Occupation: IT    Employer: [employer]   Tobacco Use   Smoking status: Never   Smokeless tobacco: Never  Substance and Sexual Activity   Alcohol use: Yes    Alcohol/week: 2.0 standard drinks    Types: 2 Cans of beer per week    Comment: 2 packs/month    Drug use: Never   Sexual activity: Not on file  Other Topics Concern   Not on file  Social History Narrative   Patient [place] in [place] and small child   Works in Consulting civil engineerT for [employer]   Social Determinants of Corporate investment bankerHealth   Financial Resource Strain: Not on file  Food Insecurity: Not on file  Transportation Needs: Not on file  Physical Activity: Not on file  Stress: Not on file  Social Connections: Not on file     Family History: The patient's family history includes Diabetes in his brother; Thyroid disease in his brother and mother. There is no history of Heart disease.  ROS:   Please see the history of present illness.    Anxiety, difficulty sleeping, his wife is concerned that at times he does not want to communicate.  All other systems reviewed and are negative.  EKGs/Labs/Other Studies Reviewed:    The following studies were reviewed today:  2D Doppler echocardiogram 11/09/2019: IMPRESSIONS     1. Left ventricular ejection fraction, by estimation, is 30 to 35%. The  left ventricle has moderately decreased function. The left ventricle  demonstrates  regional wall motion abnormalities with mid to apical  anteroseptal akinesis, mid to apical anterior   akinesis, apical inferior akinesis, apical akinesis. No LV thrombus. Left  ventricular diastolic parameters were normal.   2. Right ventricular systolic function is normal. The right ventricular  size is normal. Tricuspid regurgitation signal is inadequate for assessing  PA pressure.   3. The mitral valve is normal in structure. No evidence of mitral valve  regurgitation. No evidence of mitral stenosis.   4. The aortic valve is tricuspid. Aortic valve regurgitation is not  visualized. No aortic stenosis is present.   5. The inferior vena cava is normal in size with greater than 50%  respiratory variability, suggesting right atrial pressure of 3 mmHg.   Comparison(s): 11/24/18 EF 30-35%.   Cardiac MRI 01/31/2019: IMPRESSION: 1. Normal LV size with EF 34%, wall motion abnormalities in LAD infarction pattern as noted above.   2.  Normal RV size and systolic function, EF 60%.  3. Dense LAD territory scar, this myocardium is unlikely to be viable.  EKG:  EKG not performed.  Recent Labs: 07/11/2020: ALT 20 11/25/2020: BUN 12; Creatinine, Ser 1.00; Hemoglobin 14.8; Platelets 224; Potassium 4.3; Sodium 133  Recent Lipid Panel    Component Value Date/Time   CHOL 108 07/11/2020 0912   TRIG 205 (H) 07/11/2020 0912   HDL 40 07/11/2020 0912   CHOLHDL 2.7 07/11/2020 0912   CHOLHDL 4.6 10/13/2018 0317   VLDL 35 10/13/2018 0317   LDLCALC 35 07/11/2020 0912   LDLDIRECT 35 08/21/2019 0844    Physical Exam:    VS:  BP 108/62    Pulse 82    Ht 5\' 5"  (1.651 m)    Wt 129 lb 12.8 oz (58.9 kg)    SpO2 98%    BMI 21.60 kg/m     Wt Readings from Last 3 Encounters:  05/18/21 129 lb 12.8 oz (58.9 kg)  05/12/21 129 lb 12.8 oz (58.9 kg)  04/22/21 127 lb (57.6 kg)     GEN: Healthy appearing. No acute distress HEENT: Normal NECK: No JVD. LYMPHATICS: No lymphadenopathy CARDIAC: No murmur. RRR  no gallop, or edema. VASCULAR:  Normal Pulses. No bruits. RESPIRATORY:  Clear to auscultation without rales, wheezing or rhonchi  ABDOMEN: Soft, non-tender, non-distended, No pulsatile mass, MUSCULOSKELETAL: No deformity  SKIN: Warm and dry NEUROLOGIC:  Alert and oriented x 3 PSYCHIATRIC:  Normal affect   ASSESSMENT:    1. Coronary artery disease of native artery of native heart with stable angina pectoris (HCC)   2. Chronic systolic CHF (congestive heart failure) (HCC)   3. ICD (implantable cardioverter-defibrillator) in place   4. Hyperlipidemia LDL goal <70   5. Ischemic cardiomyopathy   6. 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  Most important among those are continued aggressive lipid-lowering, antiplatelet therapy with clopidogrel mono therapy, okay to stop aspirin, and 150 minutes or more of moderate activity per week. He is on double therapy for systolic heart failure.  Able to tolerate only low-dose beta-blocker therapy.  I discussed quadruple therapy with him and if EF is still as low as before, we will add 06/20/21 or Jardiance.  We have tried low-dose MRA therapy in the past and developed hypotension. He will need an electrophysiologist to manage/follow his subcu ICD. Continue Repatha. Optimize therapy to prevent progressive LV dysfunction with aim being quadruple therapy. Consider cardiac CT to assess for demonstrated coronary fistula.  Alternative would be invasive angiography.  Overall education and awareness concerning secondary risk prevention was discussed in detail: LDL less than 70, hemoglobin A1c less than 7, blood pressure target less than 130/80 mmHg, >150 minutes of moderate aerobic activity per week, avoidance of smoking, weight control (via diet and exercise), and continued surveillance/management of/for obstructive sleep apnea.  Guideline directed therapy for left  ventricular systolic dysfunction: Angiotensin receptor-neprilysin inhibitor (ARNI)-Entresto; beta-blocker therapy - carvedilol, metoprolol succinate, or bisoprolol; mineralocorticoid receptor antagonist (MRA) therapy -spironolactone or eplerenone.  SGLT-2 agents -  Dapagliflozin [place]) or Empagliflozin (Jardiance).These therapies have been shown to improve clinical outcomes including reduction of rehospitalization, survival, and acute heart failure.  We will make physical copies of the coronary angiography PCI report, discharge summary, device implant report, most recent echocardiogram reports, and MRI report.  He needs to establish with a cardiology group that has electrophysiology and interventional capability.   Extended office visit related to counseling  which accounted for greater than 70% of the office visit which lasted greater than 60 minutes.  Medication Adjustments/Labs and Tests Ordered: Current medicines are reviewed at length with the patient today.  Concerns regarding medicines are outlined above.  No orders of the defined types were placed in this encounter.  No orders of the defined types were placed in this encounter.   Patient Instructions  Medication Instructions:  Your physician recommends that you continue on your current medications as directed. Please refer to the Current Medication list given to you today.  *If you need a refill on your cardiac medications before your next appointment, please call your pharmacy*   Lab Work: None If you have labs (blood work) drawn today and your tests are completely normal, you will receive your results only by: MyChart Message (if you have MyChart) OR A paper copy in the mail If you have any lab test that is abnormal or we need to change your treatment, we will call you to review the results.   Testing/Procedures: None   Follow-Up: At [facility], you and your health needs are our priority.  As part of our continuing  mission to provide you with exceptional heart care, we have created designated Provider Care Teams.  These Care Teams include your primary Cardiologist (physician) and Advanced Practice Providers (APPs -  Physician Assistants and Nurse Practitioners) who all work together to provide you with the care you need, when you need it.  We recommend signing up for the patient portal called "MyChart".  Sign up information is provided on this After Visit Summary.  MyChart is used to connect with patients for Virtual Visits (Telemedicine).  Patients are able to view lab/test results, encounter notes, upcoming appointments, etc.  Non-urgent messages can be sent to your provider as well.   To learn more about what you can do with MyChart, go to [email or web address].    Your next appointment:   As needed  The format for your next appointment:   In Person  Provider:   Lesleigh Noe, MD     Other Instructions     Signed, James Noe, MD  05/18/2021 12:45 PM    [facility]

## 2021-05-18 ENCOUNTER — Other Ambulatory Visit: Payer: Self-pay

## 2021-05-18 ENCOUNTER — Ambulatory Visit (INDEPENDENT_AMBULATORY_CARE_PROVIDER_SITE_OTHER): Payer: BC Managed Care – PPO | Admitting: Interventional Cardiology

## 2021-05-18 ENCOUNTER — Encounter: Payer: Self-pay | Admitting: Interventional Cardiology

## 2021-05-18 VITALS — BP 108/62 | HR 82 | Ht 65.0 in | Wt 129.8 lb

## 2021-05-18 DIAGNOSIS — E785 Hyperlipidemia, unspecified: Secondary | ICD-10-CM | POA: Diagnosis not present

## 2021-05-18 DIAGNOSIS — I5022 Chronic systolic (congestive) heart failure: Secondary | ICD-10-CM

## 2021-05-18 DIAGNOSIS — Q245 Malformation of coronary vessels: Secondary | ICD-10-CM

## 2021-05-18 DIAGNOSIS — I25118 Atherosclerotic heart disease of native coronary artery with other forms of angina pectoris: Secondary | ICD-10-CM

## 2021-05-18 DIAGNOSIS — I255 Ischemic cardiomyopathy: Secondary | ICD-10-CM

## 2021-05-18 DIAGNOSIS — Z95810 Presence of automatic (implantable) cardiac defibrillator: Secondary | ICD-10-CM

## 2021-05-18 NOTE — Patient Instructions (Signed)
Medication Instructions:  ?Your physician recommends that you continue on your current medications as directed. Please refer to the Current Medication list given to you today. ? ?*If you need a refill on your cardiac medications before your next appointment, please call your pharmacy* ? ? ?Lab Work: ?None ?If you have labs (blood work) drawn today and your tests are completely normal, you will receive your results only by: ?MyChart Message (if you have MyChart) OR ?A paper copy in the mail ?If you have any lab test that is abnormal or we need to change your treatment, we will call you to review the results. ? ? ?Testing/Procedures: ?None ? ? ?Follow-Up: ?At [facility], you and your health needs are our priority.  As part of our continuing mission to provide you with exceptional heart care, we have created designated Provider Care Teams.  These Care Teams include your primary Cardiologist (physician) and Advanced Practice Providers (APPs -  Physician Assistants and Nurse Practitioners) who all work together to provide you with the care you need, when you need it. ? ?We recommend signing up for the patient portal called "MyChart".  Sign up information is provided on this After Visit Summary.  MyChart is used to connect with patients for Virtual Visits (Telemedicine).  Patients are able to view lab/test results, encounter notes, upcoming appointments, etc.  Non-urgent messages can be sent to your provider as well.   ?To learn more about what you can do with MyChart, go to [email or web address].   ? ?Your next appointment:   ?As needed ? ?The format for your next appointment:   ?In Person ? ?Provider:   ?Henry W Smith III, MD  ? ? ?Other Instructions ?  ?

## 2021-05-22 ENCOUNTER — Ambulatory Visit (INDEPENDENT_AMBULATORY_CARE_PROVIDER_SITE_OTHER): Payer: BC Managed Care – PPO

## 2021-05-22 ENCOUNTER — Other Ambulatory Visit: Payer: Self-pay

## 2021-05-22 ENCOUNTER — Telehealth (HOSPITAL_BASED_OUTPATIENT_CLINIC_OR_DEPARTMENT_OTHER): Payer: Self-pay

## 2021-05-22 DIAGNOSIS — Z95810 Presence of automatic (implantable) cardiac defibrillator: Secondary | ICD-10-CM | POA: Diagnosis not present

## 2021-05-22 DIAGNOSIS — I5020 Unspecified systolic (congestive) heart failure: Secondary | ICD-10-CM

## 2021-05-22 DIAGNOSIS — I5022 Chronic systolic (congestive) heart failure: Secondary | ICD-10-CM

## 2021-05-22 LAB — ECHOCARDIOGRAM COMPLETE
AR max vel: 1.67 cm2
AV Area VTI: 1.4 cm2
AV Area mean vel: 1.39 cm2
AV Mean grad: 4 mmHg
AV Peak grad: 7.1 mmHg
Ao pk vel: 1.33 m/s
Area-P 1/2: 4.41 cm2
Calc EF: 37.7 %
S' Lateral: 3.82 cm
Single Plane A2C EF: 34.9 %
Single Plane A4C EF: 37.4 %

## 2021-05-22 MED ORDER — PERFLUTREN LIPID MICROSPHERE
1.0000 mL | INTRAVENOUS | Status: AC | PRN
  Administered 2021-05-22: 2 mL via INTRAVENOUS

## 2021-05-22 MED ORDER — DAPAGLIFLOZIN PROPANEDIOL 10 MG PO TABS: 10.0000 mg | ORAL_TABLET | Freq: Every day | ORAL | 3 refills | Status: DC

## 2021-05-22 NOTE — Telephone Encounter (Addendum)
Results called to patient who verbalizes understanding!  ? ? ?Medication and labs ordered and mailed to patient  ? ?----- Message from Alver Sorrow, NP sent at 05/22/2021  4:41 PM EST ----- ?Echo shows heart pumping function remains low at 30-35%. Similar to previous. Plan to start Farxiga 10mg  daily for additional heart failure benefit. Repeat BMP in 2-3 weeks. ? ? ?

## 2021-06-01 ENCOUNTER — Telehealth: Payer: Self-pay

## 2021-06-01 NOTE — Telephone Encounter (Signed)
**Note De-Identified Gael Londo Obfuscation** I called [facility] at [phone] and did a [place] PA over the phone with Sherian Maroon. ?Per Melton Alar this PA has been approved until 06/01/2022. ?Case #: [id number] ? ?I have notified [id number] - [place],  - [address] (Ph: [phone]) of this approval. ?

## 2021-06-03 ENCOUNTER — Ambulatory Visit (INDEPENDENT_AMBULATORY_CARE_PROVIDER_SITE_OTHER): Payer: BC Managed Care – PPO

## 2021-06-03 DIAGNOSIS — I5022 Chronic systolic (congestive) heart failure: Secondary | ICD-10-CM | POA: Diagnosis not present

## 2021-06-03 DIAGNOSIS — I255 Ischemic cardiomyopathy: Secondary | ICD-10-CM

## 2021-06-03 LAB — CUP PACEART REMOTE DEVICE CHECK
Battery Remaining Percentage: 85 %
Date Time Interrogation Session: 20230314180400
Implantable Lead Implant Date: 20211025
Implantable Lead Location: 753860
Implantable Lead Model: 3501
Implantable Lead Serial Number: 198127
Implantable Pulse Generator Implant Date: 20211025
Pulse Gen Serial Number: 146448

## 2021-06-15 NOTE — Progress Notes (Signed)
Remote ICD transmission.   

## 2021-06-23 ENCOUNTER — Other Ambulatory Visit: Payer: Self-pay | Admitting: Interventional Cardiology

## 2021-08-31 ENCOUNTER — Other Ambulatory Visit: Payer: Self-pay | Admitting: Interventional Cardiology

## 2021-09-02 ENCOUNTER — Ambulatory Visit: Payer: BC Managed Care – PPO

## 2021-09-30 LAB — CUP PACEART REMOTE DEVICE CHECK
Battery Remaining Percentage: 82 %
Date Time Interrogation Session: 20230613204400
Implantable Lead Implant Date: 20211025
Implantable Lead Location: 753860
Implantable Lead Model: 3501
Implantable Lead Serial Number: 198127
Implantable Pulse Generator Implant Date: 20211025
Pulse Gen Serial Number: 146448

## 2021-11-26 ENCOUNTER — Encounter: Payer: Self-pay | Admitting: Interventional Cardiology

## 2021-11-27 MED ORDER — METOPROLOL SUCCINATE ER 25 MG PO TB24: 12.5000 mg | ORAL_TABLET | Freq: Every day | ORAL | 1 refills | Status: DC

## 2021-12-02 ENCOUNTER — Ambulatory Visit (INDEPENDENT_AMBULATORY_CARE_PROVIDER_SITE_OTHER): Payer: BC Managed Care – PPO

## 2021-12-02 DIAGNOSIS — I255 Ischemic cardiomyopathy: Secondary | ICD-10-CM

## 2021-12-03 LAB — CUP PACEART REMOTE DEVICE CHECK
Battery Remaining Percentage: 79 %
Date Time Interrogation Session: 20230912193200
Implantable Lead Implant Date: 20211025
Implantable Lead Location: 753860
Implantable Lead Model: 3501
Implantable Lead Serial Number: 198127
Implantable Pulse Generator Implant Date: 20211025
Pulse Gen Serial Number: 146448

## 2021-12-17 NOTE — Progress Notes (Signed)
Remote ICD transmission.   

## 2022-01-14 ENCOUNTER — Encounter: Payer: Self-pay | Admitting: Interventional Cardiology

## 2022-03-09 ENCOUNTER — Other Ambulatory Visit (HOSPITAL_BASED_OUTPATIENT_CLINIC_OR_DEPARTMENT_OTHER): Payer: Self-pay | Admitting: Family

## 2022-03-09 ENCOUNTER — Other Ambulatory Visit: Payer: Self-pay | Admitting: Interventional Cardiology

## 2022-03-09 NOTE — Telephone Encounter (Signed)
Patient of Dr. Smith. Please review for refill. Thank you!  

## 2022-03-10 ENCOUNTER — Other Ambulatory Visit: Payer: Self-pay | Admitting: Interventional Cardiology

## 2022-03-11 NOTE — Telephone Encounter (Signed)
PA submitted  KEY [id number]

## 2022-03-11 NOTE — Telephone Encounter (Signed)
Approved through 03/11/23

## 2022-05-19 ENCOUNTER — Other Ambulatory Visit: Payer: Self-pay

## 2022-05-19 MED ORDER — CLOPIDOGREL BISULFATE 75 MG PO TABS: 75.0000 mg | ORAL_TABLET | Freq: Every day | ORAL | 0 refills | Status: AC

## 2022-05-19 MED ORDER — ENTRESTO 49-51 MG PO TABS: 1.0000 | ORAL_TABLET | Freq: Two times a day (BID) | ORAL | 0 refills | Status: AC

## 2022-05-19 NOTE — Addendum Note (Signed)
Addended by: Carter Kitten D on: 05/19/2022 11:10 AM   Modules accepted: Orders
